# Patient Record
Sex: Male | Born: 1960 | Race: White | Hispanic: No | Marital: Married | Smoking: Former smoker
Health system: Southern US, Community
[De-identification: ages and names within clinical notes are randomized; demographics above are authoritative.]

## PROBLEM LIST (undated history)

## (undated) DIAGNOSIS — I10 Essential (primary) hypertension: Secondary | ICD-10-CM

## (undated) DIAGNOSIS — N182 Chronic kidney disease, stage 2 (mild): Secondary | ICD-10-CM

## (undated) DIAGNOSIS — I214 Non-ST elevation (NSTEMI) myocardial infarction: Secondary | ICD-10-CM

## (undated) DIAGNOSIS — R739 Hyperglycemia, unspecified: Secondary | ICD-10-CM

## (undated) DIAGNOSIS — N189 Chronic kidney disease, unspecified: Secondary | ICD-10-CM

## (undated) DIAGNOSIS — M1990 Unspecified osteoarthritis, unspecified site: Secondary | ICD-10-CM

## (undated) DIAGNOSIS — I2510 Atherosclerotic heart disease of native coronary artery without angina pectoris: Secondary | ICD-10-CM

## (undated) DIAGNOSIS — E7800 Pure hypercholesterolemia, unspecified: Secondary | ICD-10-CM

## (undated) DIAGNOSIS — E119 Type 2 diabetes mellitus without complications: Secondary | ICD-10-CM

## (undated) HISTORY — DX: Non-ST elevation (NSTEMI) myocardial infarction: I21.4

## (undated) HISTORY — PX: NECK SURGERY: SHX720

## (undated) HISTORY — DX: Chronic kidney disease, stage 2 (mild): N18.2

## (undated) HISTORY — PX: BACK SURGERY: SHX140

## (undated) HISTORY — DX: Essential (primary) hypertension: I10

## (undated) HISTORY — DX: Chronic kidney disease, unspecified: N18.9

## (undated) HISTORY — PX: HERNIA REPAIR: SHX51

## (undated) HISTORY — DX: Type 2 diabetes mellitus without complications: E11.9

---

## 2002-10-08 ENCOUNTER — Encounter: Payer: Self-pay | Admitting: Neurosurgery

## 2002-10-08 ENCOUNTER — Ambulatory Visit (HOSPITAL_COMMUNITY): Admission: RE | Admit: 2002-10-08 | Discharge: 2002-10-09 | Payer: Self-pay | Admitting: Neurosurgery

## 2002-11-03 ENCOUNTER — Encounter: Payer: Self-pay | Admitting: Neurosurgery

## 2002-11-03 ENCOUNTER — Encounter: Admission: RE | Admit: 2002-11-03 | Discharge: 2002-11-03 | Payer: Self-pay | Admitting: Neurosurgery

## 2005-05-14 ENCOUNTER — Ambulatory Visit (HOSPITAL_COMMUNITY): Admission: RE | Admit: 2005-05-14 | Discharge: 2005-05-14 | Payer: Self-pay | Admitting: Family Medicine

## 2005-11-02 ENCOUNTER — Ambulatory Visit (HOSPITAL_COMMUNITY): Admission: RE | Admit: 2005-11-02 | Discharge: 2005-11-02 | Payer: Self-pay | Admitting: Family Medicine

## 2009-12-26 ENCOUNTER — Ambulatory Visit (HOSPITAL_COMMUNITY): Admission: RE | Admit: 2009-12-26 | Discharge: 2009-12-26 | Payer: Self-pay | Admitting: Family Medicine

## 2010-06-01 NOTE — H&P (Addendum)
  NAMEASHWIN, Graves NO.:  [id number]  MEDICAL RECORD NO.:  [id number]           PATIENT TYPE:  LOCATION:                                 FACILITY:  PHYSICIAN:  Dalia Heading, M.D.  DATE OF BIRTH:  01/04/1961  DATE OF ADMISSION: DATE OF DISCHARGE:  LH                             HISTORY & PHYSICAL   CHIEF COMPLAINT:  Right inguinal hernia.  HISTORY OF PRESENT ILLNESS:  The patient is a 50 year old white male who is referred for evaluation and treatment of right inguinal hernia.  It has been present since last year, but recently is increasing in size and causing him discomfort.  It is made worse with straining.  PAST MEDICAL HISTORY:  Hypertension, gout, high cholesterol levels.  PAST SURGICAL HISTORY:  Neck surgery in 2005.  CURRENT MEDICATIONS:  A blood pressure pill, a gout pill, a cholesterol pill.  ALLERGIES:  No known drug allergies.  REVIEW OF SYSTEMS:  The patient denies drinking or smoking.  Denies anyother cardiopulmonary difficulties or bleeding disorders.  PHYSICAL EXAMINATION:  GENERAL:  The patient is a well-developed, well- nourished white male in no acute distress. HEENT:  Reveals no scleral icterus. LUNGS:  Clear to auscultation with equal breath sounds bilaterally. HEART:  Reveals regular rate and rhythm without S3, S4, or murmurs. ABDOMEN:  Soft, nontender, nondistended.  No hepatosplenomegaly or masses are noted.  Reducible right inguinal hernia is present. GENITOURINARY:  Within normal limits.  IMPRESSION:  Right inguinal hernia.  PLAN:  The patient is scheduled for right inguinal herniorrhaphy on June 13, 2010.  The risks and benefits of the procedure including bleeding, infection, pain, the possibility of recurrence of the hernia were fully explained to the patient, gave informed consent.     Dalia Heading, M.D.     MAJ/MEDQ  D:  05/30/2010  T:  05/31/2010  Job:  [id number]  cc:   Kirk Ruths, M.D. Fax:  [phone]  Electronically Signed by Franky Macho M.D. on 06/13/2010 08:59:12 AM

## 2010-06-07 ENCOUNTER — Other Ambulatory Visit: Payer: Self-pay | Admitting: General Surgery

## 2010-06-07 ENCOUNTER — Encounter (HOSPITAL_COMMUNITY): Payer: Managed Care, Other (non HMO) | Attending: General Surgery

## 2010-06-07 DIAGNOSIS — Z01812 Encounter for preprocedural laboratory examination: Secondary | ICD-10-CM | POA: Insufficient documentation

## 2010-06-07 DIAGNOSIS — Z01810 Encounter for preprocedural cardiovascular examination: Secondary | ICD-10-CM | POA: Insufficient documentation

## 2010-06-07 LAB — BASIC METABOLIC PANEL
CO2: 30 mEq/L (ref 19–32)
Calcium: 9.4 mg/dL (ref 8.4–10.5)
Chloride: 100 mEq/L (ref 96–112)
GFR calc Af Amer: >60 mL/min (ref >60)
Sodium: 141 mEq/L (ref 135–145)

## 2010-06-07 LAB — SURGICAL PCR SCREEN: Staphylococcus aureus: POSITIVE — AB

## 2010-06-07 LAB — CBC
MCV: 86.5 fL (ref 78.0–100.0)
Platelets: 243 10*3/uL (ref 150–400)
RBC: 4.67 MIL/uL (ref 4.22–5.81)
RDW: 14 % (ref 11.5–15.5)
WBC: 7.6 10*3/uL (ref 4.0–10.5)

## 2010-06-13 ENCOUNTER — Ambulatory Visit (HOSPITAL_COMMUNITY)
Admission: RE | Admit: 2010-06-13 | Discharge: 2010-06-13 | Disposition: A | Discharge status: Home / Self Care | Payer: Managed Care, Other (non HMO) | Source: Ambulatory Visit | Attending: General Surgery | Admitting: General Surgery

## 2010-06-13 DIAGNOSIS — K4090 Unilateral inguinal hernia, without obstruction or gangrene, not specified as recurrent: Secondary | ICD-10-CM | POA: Insufficient documentation

## 2010-06-13 DIAGNOSIS — Z79899 Other long term (current) drug therapy: Secondary | ICD-10-CM | POA: Insufficient documentation

## 2010-06-13 DIAGNOSIS — I10 Essential (primary) hypertension: Secondary | ICD-10-CM | POA: Insufficient documentation

## 2010-06-15 NOTE — Op Note (Signed)
  [name], [name] NO.:  [id number]  MEDICAL RECORD NO.:  [id number]           PATIENT TYPE:  O  LOCATION:  [facility]                          FACILITY:  [facility]  PHYSICIAN:  Dalia Heading, M.D.  DATE OF BIRTH:  1960/04/18  DATE OF PROCEDURE:  06/13/2010 DATE OF DISCHARGE:                              OPERATIVE REPORT   PREOPERATIVE DIAGNOSIS:  Right inguinal hernia.  POSTOPERATIVE DIAGNOSIS:  Right inguinal hernia, indirect.  PROCEDURE:  Right inguinal herniorrhaphy.  SURGEON:  Dalia Heading, MD  ANESTHESIA:  General endotracheal.  INDICATIONS:  The patient is a 50 year old white male who presents with symptomatic right inguinal hernia.  The risks and benefits of the procedure including bleeding, infection, pain, and the possibility of recurrence of the hernia were fully explained to the patient, gave informed consent.  PROCEDURE NOTE:  The patient was placed in a supine position.  After general anesthesia was administered, the right groin region was prepped and draped using the usual sterile technique with Betadine.  Surgical site confirmation was performed.  A transverse incision was made in the right groin region down to the external oblique aponeurosis.  The aponeurosis was incised to the external ring.  The ilioinguinal nerve was identified and was transected and ligated due to its frail nature.  Sensorcaine 0.5% was instilled into the proximal portion of the ilioinguinal nerve.  An indirect Penrose drain was placed around the spermatic cord.  The vase deferens was noted within the spermatic cord.  A high ligation of the lipoma was performed.  The lipoma was disposed.  An indirect hernia sac was freed away from the spermatic cord up to the peritoneal reflection and inverted.  A medium-size Bard PerFix plug was then inserted.  An onlay patch was then placed along the floor of the inguinal canal and secured superiorly to the conjoined tendon and  inferiorly to the shelving edge Poupart's ligament using 2-0 Novafil interrupted sutures.  The internal ring was recreated using a 2-0 Novafil interrupted suture.  An On-Q pain pump catheter was then inserted next to the spermatic cord.  It was brought out through a separate percutaneous insertion site from the incision.  The external oblique aponeurosis was reapproximated using a 2- 0 Vicryl interrupted suture.  The subcutaneous layer was reapproximated using a 3-0 Vicryl interrupted suture.  The skin was closed using a 4-0 Vicryl subcuticular suture.  Sensorcaine 0.5% was instilled in the surrounding wound.  Dermabond was then applied.  All tape and needle counts were correct at the end of procedure.  The patient was awakened and transferred to PACU in stable condition.  COMPLICATIONS:  None.  SPECIMEN:  None.  ESTIMATED BLOOD LOSS:  Minimal.     Dalia Heading, M.D.     MAJ/MEDQ  D:  06/13/2010  T:  06/13/2010  Job:  [id number]  cc:   Seth Graves, M.D. Fax: [phone]  Electronically Signed by Franky Macho M.D. on 06/15/2010 10:02:00 AM

## 2010-09-01 NOTE — Op Note (Signed)
NAME:  Seth Graves, Seth Graves                            ACCOUNT NO.:  [id number]   MEDICAL RECORD NO.:  [id number]                   PATIENT TYPE:  OIB   LOCATION:  2899                                 FACILITY:  [facility]   PHYSICIAN:  Reinaldo Meeker, M.D.              DATE OF BIRTH:  [birth year]   DATE OF PROCEDURE:  10/08/2002  DATE OF DISCHARGE:                                 OPERATIVE REPORT   PREOPERATIVE DIAGNOSIS:  Herniated disc C5-C6, left.   POSTOPERATIVE DIAGNOSIS:  Herniated disc C5-C6, left.   PROCEDURE:  C5-C6 anterior cervical discectomy with bone bank fusion  followed by cervical plating with the operating microscope.   SURGEON:  Reinaldo Meeker, M.D.   ASSISTANT:  Donalee Citrin, M.D.   PROCEDURE IN DETAIL:  After being placed in the prone position and 10 pounds  of halter traction, the patient's neck was prepped and draped in the usual  sterile fashion.  Localizing x-ray was taken prior to the incision to  identify the appropriate level.  A transverse incision was made in the right  anterior neck starting at the midline and heading towards the medial aspect  of the sternocleidomastoid muscle.  The platysmal muscle was incised  transversely.  The natural fascial plane between the strap muscles medially,  sternocleidomastoid muscle laterally, was identified down to the anterior  aspect of the cervical spine.  The longus colli muscles were identified,  split in the midline, swept away bilaterally, and held with a Art therapist.  Self-retaining retractor was placed for exposure and an x-ray showed we  approached the C5-C6 level.  Using the 15 blade, the annulus of the disc was  incised.  Using pituitaries, rongeurs, and curets, the disc material was  removed.  A high speed drill was used to widen the interspace.  At this  time, the microscope was draped and brought into the field and used until  the end of the case.  Using microdissection technique, the remaining disc  protrusion on the posterior longitudinal ligament was removed.  The ligament  was then incised transversely and the cut edge was removed with a Kerrison  punch.  Inspection of the C5-C6 on the left reveals a large amount of  herniated disc material.  This was removed to decompress the underlying C6  nerve root.  Similar decompression was carried out towards the right side.  At this point, inspection was carried out in all directions for evidence of  residual compression and none could be identified.  Large amounts of  irrigation was carried out and any bleeding controlled with bipolar  coagulation and Gelfoam.  Measurements were taken and a 7 by 11 by 14 mm  bone plug was reconstituted.  After irrigating once more and confirming  hemostasis, the plug was impacted and fluoroscopy showed it to be in good  position.  An appropriate plate was then chosen.  Under fluoroscopic  guidance, drill holes were placed, followed by placement of 15 mm screws x  4.  The locking mechanism was then secured.  Final fluoroscopy showed the  plate, plug, and screws to all be in good position.  Large amounts of  irrigation was carried out at this time with any bleeding controlled by  bipolar coagulation.  The wound was then closed using interrupted Vicryl in  the platysma muscle, inverted 5-0 PDS on the subcuticular layer, and Steri-  Strips on the skin.  Sterile dressings were then applied and the patient was  extubated and taken to the recovery room in stable condition.                                              Reinaldo Meeker, M.D.   ROK/MEDQ  D:  10/08/2002  T:  10/09/2002  Job:  [id number]

## 2012-06-03 ENCOUNTER — Encounter (HOSPITAL_COMMUNITY): Payer: Self-pay

## 2012-06-03 ENCOUNTER — Emergency Department (HOSPITAL_COMMUNITY): Payer: Managed Care, Other (non HMO)

## 2012-06-03 ENCOUNTER — Inpatient Hospital Stay (HOSPITAL_COMMUNITY)
Admission: EM | Admit: 2012-06-03 | Discharge: 2012-06-05 | DRG: 247 | Disposition: A | Discharge status: Home / Self Care | Payer: Managed Care, Other (non HMO) | Attending: Cardiovascular Disease | Admitting: Cardiovascular Disease

## 2012-06-03 DIAGNOSIS — R778 Other specified abnormalities of plasma proteins: Secondary | ICD-10-CM

## 2012-06-03 DIAGNOSIS — R079 Chest pain, unspecified: Secondary | ICD-10-CM

## 2012-06-03 DIAGNOSIS — Z23 Encounter for immunization: Secondary | ICD-10-CM

## 2012-06-03 DIAGNOSIS — I214 Non-ST elevation (NSTEMI) myocardial infarction: Principal | ICD-10-CM | POA: Diagnosis present

## 2012-06-03 DIAGNOSIS — E7800 Pure hypercholesterolemia, unspecified: Secondary | ICD-10-CM | POA: Diagnosis present

## 2012-06-03 DIAGNOSIS — I2510 Atherosclerotic heart disease of native coronary artery without angina pectoris: Secondary | ICD-10-CM | POA: Diagnosis present

## 2012-06-03 DIAGNOSIS — Z79899 Other long term (current) drug therapy: Secondary | ICD-10-CM

## 2012-06-03 DIAGNOSIS — I10 Essential (primary) hypertension: Secondary | ICD-10-CM | POA: Diagnosis present

## 2012-06-03 HISTORY — DX: Pure hypercholesterolemia, unspecified: E78.00

## 2012-06-03 HISTORY — DX: Atherosclerotic heart disease of native coronary artery without angina pectoris: I25.10

## 2012-06-03 HISTORY — DX: Unspecified osteoarthritis, unspecified site: M19.90

## 2012-06-03 HISTORY — DX: Hyperglycemia, unspecified: R73.9

## 2012-06-03 LAB — CBC WITH DIFFERENTIAL/PLATELET
Basophils Relative: 0 % (ref 0–1)
Eosinophils Absolute: 0.2 10*3/uL (ref 0.0–0.7)
Hemoglobin: 14.2 g/dL (ref 13.0–17.0)
MCHC: 34.3 g/dL (ref 30.0–36.0)
Monocytes Relative: 6 % (ref 3–12)
Neutro Abs: 6.9 10*3/uL (ref 1.7–7.7)
Neutrophils Relative %: 66 % (ref 43–77)
Platelets: 238 10*3/uL (ref 150–400)
RBC: 4.83 MIL/uL (ref 4.22–5.81)

## 2012-06-03 LAB — BASIC METABOLIC PANEL
Calcium: 9.3 mg/dL (ref 8.4–10.5)
Creatinine, Ser: 1.19 mg/dL (ref 0.50–1.35)
GFR calc non Af Amer: 70 mL/min — ABNORMAL LOW (ref >90)
Sodium: 139 mEq/L (ref 135–145)

## 2012-06-03 LAB — TROPONIN I: Troponin I: 0.63 ng/mL (ref <0.30)

## 2012-06-03 LAB — D-DIMER, QUANTITATIVE: D-Dimer, Quant: 0.68 ug/mL-FEU — ABNORMAL HIGH (ref 0.00–0.48)

## 2012-06-03 LAB — PROTIME-INR
INR: 1.01 (ref 0.00–1.49)
Prothrombin Time: 13.2 seconds (ref 11.6–15.2)

## 2012-06-03 LAB — TSH: TSH: 3.717 u[IU]/mL (ref 0.350–4.500)

## 2012-06-03 LAB — MRSA PCR SCREENING: MRSA by PCR: NEGATIVE

## 2012-06-03 MED ORDER — METOPROLOL TARTRATE 25 MG PO TABS
25.0000 mg | ORAL_TABLET | Freq: Two times a day (BID) | ORAL | Status: DC
  Administered 2012-06-03 – 2012-06-04 (×3): 25 mg via ORAL
  Filled 2012-06-03 (×6): qty 1

## 2012-06-03 MED ORDER — ASPIRIN EC 81 MG PO TBEC
81.0000 mg | DELAYED_RELEASE_TABLET | Freq: Every day | ORAL | Status: DC
  Administered 2012-06-05: 81 mg via ORAL
  Filled 2012-06-03: qty 1

## 2012-06-03 MED ORDER — HEPARIN (PORCINE) IN NACL 100-0.45 UNIT/ML-% IJ SOLN
1300.0000 [IU]/h | INTRAMUSCULAR | Status: DC
  Administered 2012-06-03: 1300 [IU]/h via INTRAVENOUS
  Filled 2012-06-03 (×2): qty 250

## 2012-06-03 MED ORDER — ASPIRIN 325 MG PO TABS
325.0000 mg | ORAL_TABLET | Freq: Once | ORAL | Status: AC
  Administered 2012-06-03: 325 mg via ORAL
  Filled 2012-06-03: qty 1

## 2012-06-03 MED ORDER — NYSTATIN-TRIAMCINOLONE 100000-0.1 UNIT/GM-% EX CREA: 1.0000 "application " | TOPICAL_CREAM | Freq: Two times a day (BID) | CUTANEOUS | Status: DC | PRN

## 2012-06-03 MED ORDER — LISINOPRIL 20 MG PO TABS
20.0000 mg | ORAL_TABLET | Freq: Every day | ORAL | Status: DC
  Administered 2012-06-04 – 2012-06-05 (×2): 20 mg via ORAL
  Filled 2012-06-03 (×3): qty 1

## 2012-06-03 MED ORDER — SODIUM CHLORIDE 0.9 % IV SOLN
INTRAVENOUS | Status: DC
  Administered 2012-06-03: 19:00:00 via INTRAVENOUS

## 2012-06-03 MED ORDER — HEPARIN BOLUS VIA INFUSION
4000.0000 [IU] | Freq: Once | INTRAVENOUS | Status: AC
  Administered 2012-06-03: 4000 [IU] via INTRAVENOUS
  Filled 2012-06-03: qty 4000

## 2012-06-03 MED ORDER — NITROGLYCERIN 0.4 MG SL SUBL
0.4000 mg | SUBLINGUAL_TABLET | SUBLINGUAL | Status: DC | PRN
  Administered 2012-06-04 (×2): 0.4 mg via SUBLINGUAL
  Filled 2012-06-03: qty 25

## 2012-06-03 MED ORDER — SODIUM CHLORIDE 0.9 % IV SOLN
1.0000 mL/kg/h | INTRAVENOUS | Status: DC
  Administered 2012-06-04: 1 mL/kg/h via INTRAVENOUS

## 2012-06-03 MED ORDER — ATORVASTATIN CALCIUM 80 MG PO TABS
80.0000 mg | ORAL_TABLET | Freq: Every day | ORAL | Status: DC
  Filled 2012-06-03 (×2): qty 1

## 2012-06-03 MED ORDER — ACETAMINOPHEN 325 MG PO TABS: 650.0000 mg | ORAL_TABLET | ORAL | Status: DC | PRN

## 2012-06-03 MED ORDER — ONDANSETRON HCL 4 MG/2ML IJ SOLN: 4.0000 mg | Freq: Four times a day (QID) | INTRAMUSCULAR | Status: DC | PRN

## 2012-06-03 MED ORDER — ASPIRIN 81 MG PO CHEW
324.0000 mg | CHEWABLE_TABLET | ORAL | Status: AC
  Administered 2012-06-04: 324 mg via ORAL
  Filled 2012-06-03: qty 4

## 2012-06-03 NOTE — ED Notes (Signed)
2900 floor unable to take report at this time

## 2012-06-03 NOTE — ED Notes (Signed)
Pt reports mid sternal cp that radiated to jaw,that has been off/on since Sunday, now describes as "funny feeling" in his body. +sob at times, +nausea at times, deneis any fever or cough

## 2012-06-03 NOTE — H&P (Signed)
Patient ID: JAYLEND REILAND MRN: [id number] DOB/AGE: [birth year] 52 y.o. Admit date: 06/03/2012  Primary Care Physician:MCGOUGH,WILLIAM M, MD Primary Cardiologist: none (will be followed in [facility] office) Active Problems:   * No active hospital problems. *  HPI: A pleasant 52 year old gentleman presenting with substernal chest tightness. The patient first noticed chest discomfort about 48 hours ago and experienced pressure in the substernal region up through the arms lasting approximately one hour. Symptoms resolved and recurred again yesterday. He again had mild chest discomfort today he went to the emergency room. His cardiac enzymes were elevated. There were no acute EKG changes. He is currently chest pain-free. The patient was transferred here for further cardiac evaluation and cardiac catheterization.   the patient has no past history of similar symptoms. He works as a Naval architect. He denies fevers, chills, leg swelling, calf pain, lightheadedness, or syncope. He's had no diaphoresis, nausea, or vomiting. He does admit to associated shortness of breath.  He has a strong family history of coronary artery disease. His father had a myocardial infarction around age 8.  Past Medical History  Diagnosis Date  . Hypertension   . Hypercholesteremia     Past Surgical History  Procedure Laterality Date  . Cardiovascular stress test    . Hernia repair    . Back surgery    . Neck surgery      Family history: Positive for premature CAD in his father.  History   Social History  . Marital Status: Married    Spouse Name: N/A    Number of Children: N/A  . Years of Education: N/A   Occupational History  . Not on file.   Social History Main Topics  . Smoking status: Never Smoker   . Smokeless tobacco: Not on file  . Alcohol Use: No  . Drug Use: No  . Sexually Active: Yes    Birth Control/ Protection: None   Other Topics Concern  . Not on file   Social History Narrative    . No narrative on file     Prescriptions prior to admission  Medication Sig Dispense Refill  . lisinopril (PRINIVIL,ZESTRIL) 20 MG tablet Take 20 mg by mouth daily.      . naproxen sodium (ALEVE) 220 MG tablet Take 220 mg by mouth 2 (two) times daily as needed (headache/pain.).      Marland Kitchen nystatin-triamcinolone (MYCOLOG II) cream Apply 1 application topically 2 (two) times daily.      . simvastatin (ZOCOR) 20 MG tablet Take 20 mg by mouth every evening.       Review of systems: General: no fevers/chills/night sweats/weight loss Eyes: no blurry vision, diplopia, or amaurosis ENT: no sore throat or hearing loss Resp: no cough, wheezing, or hemoptysis CV: no edema or palpitations GI: no abdominal pain, nausea, vomiting, diarrhea, or constipation GU: no dysuria, frequency, or hematuria Skin: no rash Neuro: no headache, numbness, tingling, or weakness of extremities Musculoskeletal: no joint pain or swelling Heme: no bleeding, DVT, or easy bruising Endo: no polydipsia or polyuria  Physical Exam: Blood pressure 164/95, pulse 67, temperature 98.1 F (36.7 C), temperature source Oral, resp. rate 15, height 6' (1.829 m), weight 107.049 kg (236 lb), SpO2 99.00%.  Pt is alert and oriented, WD, WN, in no distress. HEENT: normal Neck: JVP normal. Carotid upstrokes normal without bruits. No thyromegaly. Lungs: equal expansion, clear bilaterally CV: Apex is discrete and nondisplaced, RRR without murmur or gallop Abd: soft, NT, +BS, no bruit, no  hepatosplenomegaly Back: no CVA tenderness Ext: no C/C/E        Femoral pulses 2+= without bruits        DP/PT pulses intact and = Skin: warm and dry without rash Neuro: CNII-XII intact             Strength intact = bilaterally  Labs:   Lab Results  Component Value Date   WBC 10.5 06/03/2012   HGB 14.2 06/03/2012   HCT 41.4 06/03/2012   MCV 85.7 06/03/2012   PLT 238 06/03/2012    Recent Labs Lab 06/03/12 1308  NA 139  K 3.7  CL 102  CO2 26   BUN 19  CREATININE 1.19  CALCIUM 9.3  GLUCOSE 129*   Lab Results  Component Value Date   TROPONINI 0.63* 06/03/2012    No results found for this basename: CHOL   No results found for this basename: HDL   No results found for this basename: LDLCALC   No results found for this basename: TRIG   No results found for this basename: CHOLHDL   No results found for this basename: LDLDIRECT      Radiology: Dg Chest Portable 1 View  06/03/2012  *RADIOLOGY REPORT*  Clinical Data: Chest pain.  PORTABLE CHEST - 1 VIEW  Comparison: 11/02/2005.  Findings: The cardiac silhouette, mediastinal and hilar contours are within normal limits and stable. The lungs are clear.  No pleural effusions.  The bony thorax is intact.  IMPRESSION: Normal chest x-ray.   Original Report Authenticated By: Rudie Meyer, M.D.    EKG: Sinus rhythm 89 beats per minute, nonspecific T wave abnormality, otherwise within normal limits.  ASSESSMENT AND PLAN:  67. 52 year old gentleman with acute coronary syndrome, troponin-positive. The patient's symptoms and presentation are highly typical for ACS. I think cardiac catheterization and possible PCI are clearly indicated. I have reviewed the specific risks, indications, and alternatives with the patient and his family. They understand and agree to proceed. He will be treated with unfractionated heparin, aspirin, high-dose statin, and metoprolol. If he has recurrent chest pain I would recommend loading him with brilinta 180 mg. Otherwise will plan on a loading dose on the table after his coronary anatomy is delineated.  2. Hypercholesterolemia. Will change to high potency statin and check a lipid panel in the morning.  3. Hypertension. Add beta blocker as outlined above. Adjust medicines based on blood pressure response.  Signed: Tonny Bollman 06/03/2012, 5:40 PM

## 2012-06-03 NOTE — ED Notes (Signed)
CRITICAL VALUE ALERT  Critical value received:  troponin 0.63  Date of notification: 06/03/2012  Time of notification:  1411  Critical value read back:yes  Nurse who received alert:  c Niranjan Rufener rn  MD notified (1st page):  Dr Adriana Simas  Time of first page:  1411  MD notified (2nd page):  Time of second page:  Responding MD:  Dr Adriana Simas  Time MD responded:  [phone]

## 2012-06-03 NOTE — Progress Notes (Signed)
ANTICOAGULATION CONSULT NOTE - Initial Consult  Pharmacy Consult for Heparin  Indication: chest pain/ACS  No Known Allergies  Patient Measurements: Height: 6' (182.9 cm) Weight: 236 lb (107.049 kg) IBW/kg (Calculated) : 77.6 Heparin Dosing Weight: 99 kg  Vital Signs: Temp: 98.1 F (36.7 C) (02/18 1700) Temp src: Oral (02/18 1700) BP: 170/100 mmHg (02/18 1645) Pulse Rate: 67 (02/18 1645)  Labs:  Recent Labs  06/03/12 1308  HGB 14.2  HCT 41.4  PLT 238  CREATININE 1.19  TROPONINI 0.63*    Estimated Creatinine Clearance: 95 ml/min (by C-G formula based on Cr of 1.19).   Medical History: Past Medical History  Diagnosis Date  . Hypertension   . Hypercholesteremia     Medications:  No anticoagulation PTA   Assessment: Seth Graves is a 78 YOM admitted with CP and elevated troponin to begin heparin per pharmacy. CBC is WNL. Heparin dosing weight 99 kg. No heparin given thus far.   Goal of Therapy:  Heparin level 0.3-0.7 units/ml Monitor platelets by anticoagulation protocol: Yes   Plan:  Heparin bolus 4000 units then begin heparin drip at 1300 units/hr F/u with 6 hour HL  Daily HL and CBC  Thank you,  Brett Fairy, PharmD, BCPS 06/03/2012 5:27 PM

## 2012-06-03 NOTE — ED Provider Notes (Signed)
History  This chart was scribed for Seth Hutching, MD by Ardeen Jourdain, ED Scribe. This patient was seen in room APA18/APA18 and the patient's care was started at 1307.  CSN: [id number]  Arrival date & time 06/03/12  1300   First MD Initiated Contact with Patient 06/03/12 1307      Chief Complaint  Patient presents with  . Chest Pain     The history is provided by the patient. No language interpreter was used.    Seth Graves is a 52 y.o. male who presents to the Emergency Department complaining of intermittent, sharp, mid sternal CP that began 2 days ago. He states the pain would begin in his chest and radiate into his back. He states his jaw hurt and his left arm felt numb during the intermittent periods of pain. He describes the feeling now as a "funny feeling." He denies any SOB, nausea, emesis and diaphoresis. He reports 3 episodes of pain since onset. He states he has a family h/o early onset MI and CVA in his father. He states he also has family h/o CHF. 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 Pt reports having a stress test 4-5 years ago.     Past Medical History  Diagnosis Date  . Hypertension   . Hypercholesteremia     Past Surgical History  Procedure Laterality Date  . Cardiovascular stress test    . Hernia repair    . Back surgery    . Neck surgery      No family history on file.  History  Substance Use Topics  . Smoking status: Never Smoker   . Smokeless tobacco: Not on file  . Alcohol Use: No     Review of Systems  All other systems reviewed and are negative.   A complete 10 system review of systems was obtained and all systems are negative except as noted in the HPI and PMH.   Allergies  Review of patient's allergies indicates no known allergies.  Home Medications   Current Outpatient Rx  Name  Route  Sig  Dispense  Refill  . lisinopril (PRINIVIL,ZESTRIL) 20 MG tablet   Oral   Take 20 mg by mouth daily.         . naproxen sodium (ALEVE) 220 MG tablet   Oral   Take 220 mg by mouth 2 (two) times daily as needed (headache/pain.).         Marland Kitchen nystatin-triamcinolone (MYCOLOG II) cream   Topical   Apply 1 application topically 2 (two) times daily.         . simvastatin (ZOCOR) 20 MG tablet   Oral   Take 20 mg by mouth every evening.           Triage Vitals: BP 141/106  Pulse 111  Temp(Src) 97.6 F (36.4 C) (Oral)  Resp 20  Ht 6' (1.829 m)  Wt 236 lb (107.049 kg)  BMI 32 kg/m2  SpO2 100%  Physical Exam  Nursing note and vitals reviewed. Constitutional: He is oriented to person, place, and time. He appears well-developed and well-nourished.  HENT:  Head: Normocephalic and atraumatic.  Eyes: Conjunctivae and EOM are normal. Pupils are equal, round, and reactive to light.  Neck: Normal range of motion. Neck supple.  Cardiovascular: Normal rate, regular rhythm and normal heart sounds.   Pulmonary/Chest: Effort normal and breath sounds normal.  Abdominal: Soft. Bowel sounds are normal.  Musculoskeletal: Normal range  of motion.  Neurological: He is alert and oriented to person, place, and time.  Skin: Skin is warm and dry.  Psychiatric: He has a normal mood and affect.    ED Course  Procedures (including critical care time)  DIAGNOSTIC STUDIES: Oxygen Saturation is 100% on room ar, normal by my interpretation.    COORDINATION OF CARE:  1:43 PMDiscussed treatment plan which includes EKG, CXR, CBC, BMP and troponin with pt at bedside and pt agreed to plan.     Labs Reviewed  TROPONIN I - Abnormal; Notable for the following:    Troponin I 0.63 (*)    All other components within normal limits  BASIC METABOLIC PANEL - Abnormal; Notable for the following:    Glucose, Bld 129 (*)    GFR calc non Af Amer 70 (*)    GFR calc Af Amer 81 (*)    All other components within normal limits  D-DIMER, QUANTITATIVE - Abnormal; Notable for the following:    D-Dimer, Quant 0.68 (*)     All other components within normal limits  CBC WITH DIFFERENTIAL   Dg Chest Portable 1 View  06/03/2012  *RADIOLOGY REPORT*  Clinical Data: Chest pain.  PORTABLE CHEST - 1 VIEW  Comparison: 11/02/2005.  Findings: The cardiac silhouette, mediastinal and hilar contours are within normal limits and stable. The lungs are clear.  No pleural effusions.  The bony thorax is intact.  IMPRESSION: Normal chest x-ray.   Original Report Authenticated By: Rudie Meyer, M.D.     Date: 06/03/2012  Rate: 89  Rhythm: normal sinus rhythm  QRS Axis: normal  Intervals: normal  ST/T Wave abnormalities: normal  Conduction Disutrbances: none  Narrative Interpretation: unremarkable CRITICAL CARE Performed by: Seth Graves   Total critical care time: 30  Critical care time was exclusive of separately billable procedures and treating other patients.  Critical care was necessary to treat or prevent imminent or life-threatening deterioration.  Critical care was time spent personally by me on the following activities: development of treatment plan with patient and/or surrogate as well as nursing, discussions with consultants, evaluation of patient's response to treatment, examination of patient, obtaining history from patient or surrogate, ordering and performing treatments and interventions, ordering and review of laboratory studies, ordering and review of radiographic studies, pulse oximetry and re-evaluation of patient's condition.   No diagnosis found.    MDM  Good history for acute coronary syndrome.  Risk factor profile includes hypertension, hypercholesterolemia, positive family history. EKG normal. Troponin 0.63.   Discussed with Dr. Tonny Bollman. Transfer to [facility]     I personally performed the services described in this documentation, which was scribed in my presence. The recorded information has been reviewed and is accurate.    Seth Hutching, MD 06/03/12 [phone]

## 2012-06-04 ENCOUNTER — Encounter (HOSPITAL_COMMUNITY)
Admission: EM | Disposition: A | Discharge status: Home / Self Care | Payer: Self-pay | Source: Home / Self Care | Attending: Cardiovascular Disease

## 2012-06-04 ENCOUNTER — Encounter (HOSPITAL_COMMUNITY): Payer: Self-pay | Admitting: General Practice

## 2012-06-04 DIAGNOSIS — Z01810 Encounter for preprocedural cardiovascular examination: Secondary | ICD-10-CM

## 2012-06-04 DIAGNOSIS — I2510 Atherosclerotic heart disease of native coronary artery without angina pectoris: Secondary | ICD-10-CM

## 2012-06-04 DIAGNOSIS — I214 Non-ST elevation (NSTEMI) myocardial infarction: Secondary | ICD-10-CM

## 2012-06-04 HISTORY — PX: PERCUTANEOUS CORONARY STENT INTERVENTION (PCI-S): SHX5485

## 2012-06-04 HISTORY — PX: LEFT HEART CATHETERIZATION WITH CORONARY ANGIOGRAM: SHX5451

## 2012-06-04 LAB — COMPREHENSIVE METABOLIC PANEL
ALT: 19 U/L (ref 0–53)
AST: 19 U/L (ref 0–37)
Albumin: 3.7 g/dL (ref 3.5–5.2)
CO2: 27 mEq/L (ref 19–32)
Calcium: 9 mg/dL (ref 8.4–10.5)
Creatinine, Ser: 1.04 mg/dL (ref 0.50–1.35)
GFR calc non Af Amer: 83 mL/min — ABNORMAL LOW (ref >90)
Sodium: 140 mEq/L (ref 135–145)

## 2012-06-04 LAB — CBC
MCH: 29.4 pg (ref 26.0–34.0)
MCHC: 34.5 g/dL (ref 30.0–36.0)
MCV: 85.3 fL (ref 78.0–100.0)
Platelets: 219 10*3/uL (ref 150–400)
RBC: 4.89 MIL/uL (ref 4.22–5.81)

## 2012-06-04 LAB — LIPID PANEL
LDL Cholesterol: 125 mg/dL — ABNORMAL HIGH (ref 0–99)
Total CHOL/HDL Ratio: 4.9 RATIO
VLDL: 33 mg/dL (ref 0–40)

## 2012-06-04 LAB — HEMOGLOBIN A1C: Mean Plasma Glucose: 120 mg/dL — ABNORMAL HIGH (ref <117)

## 2012-06-04 LAB — TROPONIN I: Troponin I: 0.69 ng/mL (ref <0.30)

## 2012-06-04 LAB — HEPARIN LEVEL (UNFRACTIONATED): Heparin Unfractionated: 0.18 IU/mL — ABNORMAL LOW (ref 0.30–0.70)

## 2012-06-04 LAB — POCT ACTIVATED CLOTTING TIME: Activated Clotting Time: 453 seconds

## 2012-06-04 SURGERY — LEFT HEART CATHETERIZATION WITH CORONARY ANGIOGRAM
Anesthesia: LOCAL

## 2012-06-04 MED ORDER — MIDAZOLAM HCL 2 MG/2ML IJ SOLN
INTRAMUSCULAR | Status: AC
  Filled 2012-06-04: qty 2

## 2012-06-04 MED ORDER — TICAGRELOR 90 MG PO TABS
ORAL_TABLET | ORAL | Status: AC
  Administered 2012-06-04: 90 mg via ORAL
  Filled 2012-06-04: qty 2

## 2012-06-04 MED ORDER — VERAPAMIL HCL 2.5 MG/ML IV SOLN
INTRAVENOUS | Status: AC
  Filled 2012-06-04: qty 2

## 2012-06-04 MED ORDER — BIVALIRUDIN 250 MG IV SOLR
INTRAVENOUS | Status: AC
  Filled 2012-06-04: qty 250

## 2012-06-04 MED ORDER — TICAGRELOR 90 MG PO TABS
90.0000 mg | ORAL_TABLET | Freq: Two times a day (BID) | ORAL | Status: DC
  Administered 2012-06-04 – 2012-06-05 (×2): 90 mg via ORAL
  Filled 2012-06-04 (×3): qty 1

## 2012-06-04 MED ORDER — FENTANYL CITRATE 0.05 MG/ML IJ SOLN
INTRAMUSCULAR | Status: AC
  Filled 2012-06-04: qty 2

## 2012-06-04 MED ORDER — INFLUENZA VIRUS VACC SPLIT PF IM SUSP
0.5000 mL | INTRAMUSCULAR | Status: DC
  Filled 2012-06-04: qty 0.5

## 2012-06-04 MED ORDER — SODIUM CHLORIDE 0.9 % IV SOLN
0.2500 mg/kg/h | INTRAVENOUS | Status: DC
  Administered 2012-06-04: 0.25 mg/kg/h via INTRAVENOUS
  Filled 2012-06-04: qty 250

## 2012-06-04 MED ORDER — NITROGLYCERIN 1 MG/10 ML FOR IR/CATH LAB
INTRA_ARTERIAL | Status: AC
  Filled 2012-06-04: qty 10

## 2012-06-04 MED ORDER — SODIUM CHLORIDE 0.9 % IV SOLN
1.0000 mL/kg/h | INTRAVENOUS | Status: AC
  Administered 2012-06-04: 1 mL/kg/h via INTRAVENOUS

## 2012-06-04 MED ORDER — HEPARIN SODIUM (PORCINE) 1000 UNIT/ML IJ SOLN
INTRAMUSCULAR | Status: AC
  Filled 2012-06-04: qty 1

## 2012-06-04 NOTE — Care Management Note (Addendum)
    Page 1 of 1   06/05/2012     11:20:14 AM   CARE MANAGEMENT NOTE 06/05/2012  Patient:  Seth Graves, Seth Graves   Account Number:  [id number]  Date Initiated:  06/04/2012  Documentation initiated by:  Junius Creamer  Subjective/Objective Assessment:   adm w mi     Action/Plan:   lives w wife, pcp dr Karleen Hampshire   Anticipated DC Date:  06/05/2012   Anticipated DC Plan:  HOME/SELF CARE      DC Planning Services  CM consult      Choice offered to / List presented to:             Status of service:   Medicare Important Message given?   (If response is "NO", the following Medicare IM given date fields will be blank) Date Medicare IM given:   Date Additional Medicare IM given:    Discharge Disposition:    Per UR Regulation:  Reviewed for med. necessity/level of care/duration of stay  If discussed at Long Length of Stay Meetings, dates discussed:    Comments:  2/20 @1100 .Marland KitchenMarland KitchenOletta Cohn, RN, BSN, [place] [phone] Benefits check... Brilinta $70/30 day supply.  Relayed infor to patient prior to d/c home.  Pt has 30day free card and prescription refill card in hand.   2/19 0817 debbie dowell rn,bsn

## 2012-06-04 NOTE — Progress Notes (Signed)
Patient complains of 8/10 chest pain. Pt given sublingual Nitro, placed nasal cannula with 2L o2, called for STAT ekg.

## 2012-06-04 NOTE — Progress Notes (Signed)
Spoke with Dr [place] about patients chest pain, ekg results, and last cardiac enzyme results. Patient to go to cath lab now.   Dawson Bills, RN

## 2012-06-04 NOTE — Progress Notes (Signed)
VASCULAR LAB PRELIMINARY  PRELIMINARY  PRELIMINARY  PRELIMINARY  Bilateral lower extremity venous duplex  completed.    Preliminary report:  Bilateral:  No evidence of DVT, superficial thrombosis, or Baker's Cyst.    Chelise Hanger, RVT 06/04/2012, 4:56 PM

## 2012-06-04 NOTE — CV Procedure (Signed)
Cardiac Catheterization Procedure Note  Name: Seth Graves MRN: [id number] DOB: [birth year]  Procedure: Left Heart Cath, Selective Coronary Angiography, LV angiography, PTCA and stenting of the mid RCA, Ramus intermediate, and mid left circumflex arteries.  Indication: 52 year old white male with history of hypertension, hyperlipidemia, and family history of early coronary disease presents with a non-ST elevation myocardial infarction. He had recurrent chest pain at rest despite optimal medical therapy.  Procedural Details:  The right wrist was prepped, draped, and anesthetized with 1% lidocaine. Using the modified Seldinger technique, a 5 French sheath was introduced into the right radial artery. 3 mg of verapamil was administered through the sheath, weight-based unfractionated heparin was administered intravenously. Standard Judkins catheters were used for selective coronary angiography and left ventriculography. Catheter exchanges were performed over an exchange length guidewire.  PROCEDURAL FINDINGS Hemodynamics: AO 132/85 with a mean of 108 mmHg LV 132/13 mmHg    Coronary angiography: Coronary dominance: right  Left mainstem: Large and normal  Left anterior descending (LAD): The left anterior descending artery is a large vessel extends around the apex. There is a long 30% stenosis in the mid vessel. The first diagonal is without significant disease.  Ramus intermediate: This is a large branch which has a 90-95% stenosis in the proximal vessel.  Left circumflex (LCx): There is a long segmental stenosis in the mid vessel up to 70%.  Right coronary artery (RCA):  The right coronary is a very large dominant vessel. It has mild disease in the proximal vessel up to 20-30%. In the mid vessel there is a focal stenosis of 90-95% at an acute angulated bend. There is ectasia following this lesion and then a segment of 30% disease distal.  Left ventriculography: Left ventricular systolic  function is normal, LVEF is estimated at 55-60%, there is mild mid anterior hypokinesis, there is no significant mitral regurgitation   PCI Note:  Following the diagnostic procedure, the decision was made to proceed with PCI. The radial sheath was upsized to a 6 [place]. Brilinta 180 mg was given orally. Weight-based bivalirudin was given for anticoagulation. Once a therapeutic ACT was achieved, a 6 [place] XB RCA guide catheter was inserted.  A pro-water coronary guidewire was used to cross the lesion in the mid RCA.  The lesion was predilated with a 2.5 mm balloon.  The lesion was then stented with a 4.0 x 28 mm Promus premier stent.  The stent was postdilated with a 4.5 mm noncompliant balloon.  Following PCI, there was 0% residual stenosis and TIMI-3 flow. Final angiography confirmed an excellent result.  We then proceeded with intervention of the ramus intermediate branch. We switched to a XB LAD 3.5 guide. A pro-water coronary guidewire was used to cross the lesion. The lesion was predilated with a 2.5 mm balloon. The lesion was then stented with a 3.0 x 20 mm Promus premier stent the stent was postdilated with a 3.0 mm noncompliant balloon. Following PCI, there was 0% residual stenosis and TIMI grade 3 flow. Final angiography confirmed an excellent result.  We next proceeded with intervention of the mid left circumflex. This lesion was crossed with the pro-water. It was predilated using a 2.5 mm balloon. It was then stented with a 3.0 x 20 mm Promus premier stent. The stent was postdilated with a 3.0 mm noncompliant balloon. Following PCI, there was 0% residual stenosis and TIMI grade 3 flow. Final angiography confirmed an excellent result. The patient tolerated the procedure well. There were no immediate procedural  complications. A TR band was used for radial hemostasis. The patient was transferred to the post catheterization recovery area for further monitoring.  PCI Data: Vessel #1 - RCA/Segment -  mid Percent Stenosis (pre)  90-95% TIMI-flow 3 Stent 4.0 x 28 mm Promus premier Percent Stenosis (post) 0% TIMI-flow (post) 3  Vessel #2-ramus intermediate/proximal Percent stenoses: 90-95% TIMI flow 3 Stent: 3.0 x 20 mm Promus premier Percent stenoses (post): 0% TIMI flow: 3  Vessel #3-left circumflex/mid Percent stenoses: 70% TIMI flow 3 Stent: 3.0 x 20 mm Promus premier Percent stenoses (post) 0% TIMI flow: 3  Final Conclusions:   1. Three-vessel obstructive coronary disease. 2. Good left ventricular function. 3. Successful stenting of the mid RCA, proximal ramus intermediate, and mid left circumflex with drug-eluting stents.   Recommendations:  Risk factor modification. Recommend dual antiplatelet therapy for one year.  Theron Arista [facility] 06/04/2012, 10:49 AM

## 2012-06-04 NOTE — Interval H&P Note (Signed)
History and Physical Interval Note:  06/04/2012 9:04 AM  Seth Graves  has presented today for surgery, with the diagnosis of Chest pain  The various methods of treatment have been discussed with the patient and family. After consideration of risks, benefits and other options for treatment, the patient has consented to  Procedure(s): LEFT HEART CATHETERIZATION WITH CORONARY ANGIOGRAM (N/A) as a surgical intervention .  The patient's history has been reviewed, patient examined, no change in status, stable for surgery.  I have reviewed the patient's chart and labs.  Questions were answered to the patient's satisfaction.     Theron Arista [facility] 06/04/2012 9:04 AM

## 2012-06-04 NOTE — Progress Notes (Signed)
ANTICOAGULATION CONSULT NOTE - Follow Up Consult  Pharmacy Consult for Heparin  Indication: chest pain/ACS  No Known Allergies  Patient Measurements: Height: 6' (182.9 cm) Weight: 236 lb (107.049 kg) IBW/kg (Calculated) : 77.6 Heparin Dosing Weight: 99 kg  Vital Signs: Temp: 97.6 F (36.4 C) (02/19 0005) Temp src: Oral (02/19 0005) BP: 119/91 mmHg (02/19 0000) Pulse Rate: 67 (02/18 1645)  Labs:  Recent Labs  06/03/12 1308 06/03/12 1831 06/04/12 0003  HGB 14.2  --   --   HCT 41.4  --   --   PLT 238  --   --   LABPROT  --  13.2  --   INR  --  1.01  --   HEPARINUNFRC  --   --  0.40  CREATININE 1.19  --   --   TROPONINI 0.63* 0.44* 0.69*    Estimated Creatinine Clearance: 95 ml/min (by C-G formula based on Cr of 1.19).   Medical History: Past Medical History  Diagnosis Date  . Hypertension   . Hypercholesteremia     Medications:  No anticoagulation PTA   Assessment: Mr. Sorbo is a 46 YOM admitted with CP on IV heparin. Plan for cardiac cath.   Heparin level (0.4) is at-goal on 1300 units/hr.   Goal of Therapy:  Heparin level 0.3-0.7 units/ml Monitor platelets by anticoagulation protocol: Yes   Plan:  1. Continue IV heparin at 1300 units/hr.  2. Heparin level in 6 hours to confirm dosing.   Lorre Munroe, PharmD.  06/04/2012 1:37 AM

## 2012-06-04 NOTE — Progress Notes (Signed)
TR BAND REMOVAL  LOCATION:  right radial  DEFLATED PER PROTOCOL:  yes  TIME BAND OFF / DRESSING APPLIED: 1425     SITE UPON ARRIVAL:   Level 0  SITE AFTER BAND REMOVAL:  Level 0  REVERSE ALLEN'S TEST:    positive  CIRCULATION SENSATION AND MOVEMENT:  Within Normal Limits  yes  COMMENTS:

## 2012-06-05 ENCOUNTER — Telehealth: Payer: Self-pay | Admitting: Physician Assistant

## 2012-06-05 ENCOUNTER — Encounter (HOSPITAL_COMMUNITY): Payer: Self-pay | Admitting: Physician Assistant

## 2012-06-05 DIAGNOSIS — I214 Non-ST elevation (NSTEMI) myocardial infarction: Secondary | ICD-10-CM

## 2012-06-05 DIAGNOSIS — R079 Chest pain, unspecified: Secondary | ICD-10-CM

## 2012-06-05 LAB — BASIC METABOLIC PANEL
BUN: 11 mg/dL (ref 6–23)
Chloride: 104 mEq/L (ref 96–112)
GFR calc Af Amer: 83 mL/min — ABNORMAL LOW (ref >90)
Potassium: 4.2 mEq/L (ref 3.5–5.1)
Sodium: 140 mEq/L (ref 135–145)

## 2012-06-05 LAB — CBC
HCT: 40.9 % (ref 39.0–52.0)
RDW: 13.6 % (ref 11.5–15.5)
WBC: 8.3 10*3/uL (ref 4.0–10.5)

## 2012-06-05 MED ORDER — METOPROLOL TARTRATE 50 MG PO TABS
50.0000 mg | ORAL_TABLET | Freq: Two times a day (BID) | ORAL | Status: DC
  Administered 2012-06-05: 50 mg via ORAL
  Filled 2012-06-05 (×2): qty 1

## 2012-06-05 MED ORDER — ATORVASTATIN CALCIUM 80 MG PO TABS: 80.0000 mg | ORAL_TABLET | Freq: Every day | ORAL | Status: DC

## 2012-06-05 MED ORDER — NITROGLYCERIN 0.4 MG SL SUBL: 0.4000 mg | SUBLINGUAL_TABLET | SUBLINGUAL | Status: DC | PRN

## 2012-06-05 MED ORDER — METOPROLOL TARTRATE 50 MG PO TABS: 50.0000 mg | ORAL_TABLET | Freq: Two times a day (BID) | ORAL | Status: DC

## 2012-06-05 MED ORDER — ASPIRIN 81 MG PO TBEC: 81.0000 mg | DELAYED_RELEASE_TABLET | Freq: Every day | ORAL | Status: AC

## 2012-06-05 MED ORDER — TICAGRELOR 90 MG PO TABS: 90.0000 mg | ORAL_TABLET | Freq: Two times a day (BID) | ORAL | Status: DC

## 2012-06-05 MED FILL — Dextrose Inj 5%: INTRAVENOUS | Qty: 50 | Status: AC

## 2012-06-05 NOTE — Discharge Summary (Signed)
Discharge Summary   Patient ID: Seth Graves MRN: [id number], DOB/AGE: 11/30/1960 52 y.o. Admit date: 06/03/2012 D/C date:     06/05/2012  Primary Cardiologist: Will be Morton (seen by [facility] here after transfer from [facility])  Primary Discharge Diagnoses:  1. NSTEMI/newly diagnosed CAD this admission - PTCA/DES to the mid RCA, Ramus intermediate, and mid left circumflex arteries 06/04/12 2. HTN 3. Hyperlipidemia - statin changed to higher dose, will need f/u labs 4. Hyperglycemia A1C 5.8  Secondary Discharge Diagnoses:  1. 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 He presented initially to [facility] 06/03/12 with complaints of chest pain. He first noticed it 48 hours prior to admission with pressure in the substernal region up through the arms lasting approximately one hour. Symptoms resolved but recurred so he came to the ER where cardiac enzymes were elevated. There were no acute EKG changes. He was transferred to [facility] for further evaluation. He was pain free at time of arrival. He denied fevers, chills, leg swelling, calf pain, lightheadedness, or syncope, diaphoresis, nausea, or vomiting. He was started on heparin, ASA, metoprolol, and statin was changed to a higher potency. He underwent cardiac cath the following day demonstrating 3-vessel obstructive CAD and subsequently had PTCA and stenting of the mid RCA, Ramus intermediate, and mid left circumflex arteries. LV function was good. Dual antiplatelet therapy was recommended for 1 yr. He did have a mildly elevated d-dimer on admit of 0.68 but there was low suspicion for PE given that his CAD explained his symptoms. He was not tachycardic/tachypnic/hypoxic. LE dopplers were performed which were negative. Today the patient is feeling well. Dr. Swaziland has seen and examined Seth Graves and feels he is stable for discharge. Due to his NSTEMI, he will be made a TOC patient  given our protocol. His metoprolol was adjusted for BP. Dr. Sw[place]iland has recommended not to return to work for 2 weeks. I do not think it is a good idea for Seth Graves to travel [place] to [place] for this weekend (12 hour drive there and back) and told Seth Graves so. The patient was given a RTW note and also a 30 day Brilinta rx for use with free card.    Discharge Vitals: Blood pressure 141/108, pulse 61, temperature 98 F (36.7 C), temperature source Oral, resp. rate 15, height 6' (1.829 m), weight 234 lb 12.6 oz (106.5 kg), SpO2 97.00%.  Labs: Lab Results  Component Value Date   WBC 8.3 06/05/2012   HGB 14.0 06/05/2012   HCT 40.9 06/05/2012   MCV 85.2 06/05/2012   PLT 215 06/05/2012    Recent Labs Lab 06/04/12 0437 06/05/12 0640  NA 140 140  K 4.2 4.2  CL 104 104  CO2 27 28  BUN 15 11  CREATININE 1.04 1.17  CALCIUM 9.0 9.1  PROT 6.9  --   BILITOT 0.4  --   ALKPHOS 51  --   ALT 19  --   AST 19  --   GLUCOSE 109* 106*    Recent Labs  06/03/12 1308 06/03/12 1831 06/04/12 0003 06/04/12 0437  TROPONINI 0.63* 0.44* 0.69* 0.53*   Lab Results  Component Value Date   CHOL 199 06/04/2012   HDL 41 06/04/2012   LDLCALC 125* 06/04/2012   TRIG 163* 06/04/2012   Lab Results  Component Value Date   DDIMER 0.68* 06/03/2012    Diagnostic Studies/Procedures  1. Cardiac catheterization this admission, please see full report and above for summary. 2. Dg Chest Portable 1 View 06/03/2012  *RADIOLOGY REPORT*  Clinical Data: Chest pain.  PORTABLE CHEST - 1 VIEW  Comparison: 11/02/2005.  Findings: The cardiac silhouette, mediastinal and hilar contours are within normal limits and stable. The lungs are clear.  No pleural effusions.  The bony thorax is intact.  IMPRESSION: Normal chest x-ray.   Original Report Authenticated By: Rudie Meyer, M.D.     Discharge Medications     Medication List    STOP taking these medications       ALEVE 220 MG tablet  Generic drug:  naproxen sodium      simvastatin 20 MG tablet  Commonly known as:  ZOCOR  Replaced by:  atorvastatin 80 MG tablet      TAKE these medications       aspirin 81 MG EC tablet  Take 1 tablet (81 mg total) by mouth daily.     atorvastatin 80 MG tablet  Commonly known as:  LIPITOR  Take 1 tablet (80 mg total) by mouth daily.     lisinopril 20 MG tablet  Commonly known as:  PRINIVIL,ZESTRIL  Take 20 mg by mouth daily.     metoprolol 50 MG tablet  Commonly known as:  LOPRESSOR  Take 1 tablet (50 mg total) by mouth 2 (two) times daily.     nitroGLYCERIN 0.4 MG SL tablet  Commonly known as:  NITROSTAT  Place 1 tablet (0.4 mg total) under the tongue every 5 (five) minutes as needed for chest pain (up to 3 doses).     nystatin-triamcinolone cream  Commonly known as:  MYCOLOG II  Apply 1 application topically 2 (two) times daily.     Ticagrelor 90 MG Tabs tablet  Commonly known as:  BRILINTA  Take 1 tablet (90 mg total) by mouth 2 (two) times daily.        Disposition   The patient will be discharged in stable condition to home.     Discharge Orders   Future Appointments Provider Department Dept Phone   06/11/2012 10:30 AM Dyann Kief, [facility] [place] [phone]   Future Orders Complete By Expires     Diet - low sodium heart healthy  As directed     Discharge instructions  As directed     Comments:      You were taking Aleve prior to admission - that medicine can increase risk of stomach bleeding in patients taking medicines like aspirin and Brilinta. You may try Tylenol over the counter (as directed) first instead to see if that works for your pain.    Increase activity slowly  As directed     Comments:      No driving for 1 week. No lifting over 10 lbs for 2 weeks. No sexual activity for 2 weeks. You may return to work on 06/19/12. Keep procedure site clean & dry. If you notice increased pain, swelling, bleeding or pus, call/return!  You may shower, but no soaking baths/hot  tubs/pools for 1 week.      Follow-up Information   Follow up with Kirk Ruths, MD. (Your blood sugar was borderline elevated. Please make sure this is followed by your primary care doctor.)    Contact information:   [address] [place] [address] [phone]       Follow up with Jacolyn Reedy, PA. (06/11/12 at 10:30am)  Contact information:   Therapist, music Office [address] [place] [address] [phone]          Duration of Discharge Encounter: Greater than 30 minutes including physician and PA time.  Signed, Kriste Basque Dunn PA-C 06/05/2012, 10:15 AM

## 2012-06-05 NOTE — Progress Notes (Signed)
TELEMETRY: Reviewed telemetry pt in NSR: Filed Vitals:   06/04/12 2346 06/05/12 0632 06/05/12 0752 06/05/12 0755  BP: 141/91 140/83  141/108  Pulse: 62 61    Temp: 97.4 F (36.3 C) 98 F (36.7 C) 98 F (36.7 C)   TempSrc: Oral Oral Oral   Resp:      Height:      Weight:  234 lb 12.6 oz (106.5 kg)    SpO2: 97% 97% 97%     Intake/Output Summary (Last 24 hours) at 06/05/12 0829 Last data filed at 06/04/12 1930  Gross per 24 hour  Intake 1389.5 ml  Output   1450 ml  Net  -60.5 ml    SUBJECTIVE Mild chest discomfort and dyspnea immediately after procedure- now resolved. Feels well.  LABS: Basic Metabolic Panel:  Recent Labs  96/04/54 0437 06/05/12 0640  NA 140 140  K 4.2 4.2  CL 104 104  CO2 27 28  GLUCOSE 109* 106*  BUN 15 11  CREATININE 1.04 1.17  CALCIUM 9.0 9.1   Liver Function Tests:  Recent Labs  06/04/12 0437  AST 19  ALT 19  ALKPHOS 51  BILITOT 0.4  PROT 6.9  ALBUMIN 3.7   CBC:  Recent Labs  06/03/12 1308 06/04/12 0437 06/05/12 0640  WBC 10.5 7.2 8.3  NEUTROABS 6.9  --   --   HGB 14.2 14.4 14.0  HCT 41.4 41.7 40.9  MCV 85.7 85.3 85.2  PLT 238 219 215   Cardiac Enzymes:  Recent Labs  06/03/12 1831 06/04/12 0003 06/04/12 0437  TROPONINI 0.44* 0.69* 0.53*   D-Dimer:  Recent Labs  06/03/12 1320  DDIMER 0.68*   Hemoglobin A1C:  Recent Labs  06/03/12 1831  HGBA1C 5.8*   Fasting Lipid Panel:  Recent Labs  06/04/12 0437  CHOL 199  HDL 41  LDLCALC 125*  TRIG 163*  CHOLHDL 4.9   Thyroid Function Tests:  Recent Labs  06/03/12 1831  TSH 3.717   Radiology/Studies:  Dg Chest Portable 1 View  06/03/2012  *RADIOLOGY REPORT*  Clinical Data: Chest pain.  PORTABLE CHEST - 1 VIEW  Comparison: 11/02/2005.  Findings: The cardiac silhouette, mediastinal and hilar contours are within normal limits and stable. The lungs are clear.  No pleural effusions.  The bony thorax is intact.  IMPRESSION: Normal chest x-ray.   Original  Report Authenticated By: Rudie Meyer, M.D.    ECG: 05-Jun-2012 06:37:35 [facility] ROUTINE RECORD Normal sinus rhythm Incomplete right bundle branch block Borderline ECG  PHYSICAL EXAM General: Well developed, well nourished, in no acute distress. Head: Normocephalic, atraumatic, sclera non-icteric, no xanthomas, nares are without discharge. Neck: Negative for carotid bruits. JVD not elevated. Lungs: Clear bilaterally to auscultation without wheezes, rales, or rhonchi. Breathing is unlabored. Heart: RRR S1 S2 without murmurs, rubs, or gallops.  Abdomen: Soft, non-tender, non-distended with normoactive bowel sounds. No hepatomegaly. No rebound/guarding. No obvious abdominal masses. Msk:  Strength and tone appears normal for age. Extremities: No clubbing, cyanosis or edema.  Distal pedal pulses are 2+ and equal bilaterally. Right radial site without hematoma. Neuro: Alert and oriented X 3. Moves all extremities spontaneously. Psych:  Responds to questions appropriately with a normal affect.  ASSESSMENT AND PLAN: 1. NSTEMI. S/p 3 vessel PCI with DES of RCA, intermediate, and LCX. Continue dual antiplatelet therapy for one year. 2. HTN. Will increase metoprolol to 50 mg bid. Continue lisinopril at 20 mg daily. 3. Hyperlipidemia. High dose lipitor.  Plan: discharge home  today. Follow up in 2 weeks. 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

## 2012-06-05 NOTE — Telephone Encounter (Signed)
Noted pt is to be discharged today, will contact pt tomorrow 06-06-12

## 2012-06-05 NOTE — Progress Notes (Signed)
BENEFITS CHECK-- PER JUAN/ [employer] BRILINTA 90MG  2X DAY IS COVERED $70.00 FOR 30DAY SUPPLY.

## 2012-06-05 NOTE — Progress Notes (Signed)
Pt walking independently without problems. Ed completed. 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 Thinking about changing jobs.  1010-1100 Ethelda Chick CES, ACSM

## 2012-06-05 NOTE — Telephone Encounter (Signed)
TCM appt for NSTEMI discharged on 06/05/12 / tgs

## 2012-06-05 NOTE — Discharge Summary (Signed)
Patient seen and examined and history reviewed. Agree with above findings and plan. See my earlier rounding note.  [name] 06/05/2012 12:26 PM

## 2012-06-09 NOTE — Telephone Encounter (Signed)
.  left message to have patient return my call.  

## 2012-06-11 ENCOUNTER — Ambulatory Visit (INDEPENDENT_AMBULATORY_CARE_PROVIDER_SITE_OTHER): Payer: Managed Care, Other (non HMO) | Admitting: Physician Assistant

## 2012-06-11 ENCOUNTER — Encounter: Payer: Self-pay | Admitting: Physician Assistant

## 2012-06-11 VITALS — BP 123/83 | HR 90 | Ht 72.0 in | Wt 229.1 lb

## 2012-06-11 DIAGNOSIS — I214 Non-ST elevation (NSTEMI) myocardial infarction: Secondary | ICD-10-CM

## 2012-06-11 DIAGNOSIS — I2510 Atherosclerotic heart disease of native coronary artery without angina pectoris: Secondary | ICD-10-CM

## 2012-06-11 DIAGNOSIS — M109 Gout, unspecified: Secondary | ICD-10-CM

## 2012-06-11 DIAGNOSIS — R7309 Other abnormal glucose: Secondary | ICD-10-CM

## 2012-06-11 DIAGNOSIS — E782 Mixed hyperlipidemia: Secondary | ICD-10-CM

## 2012-06-11 DIAGNOSIS — E785 Hyperlipidemia, unspecified: Secondary | ICD-10-CM

## 2012-06-11 DIAGNOSIS — I10 Essential (primary) hypertension: Secondary | ICD-10-CM

## 2012-06-11 DIAGNOSIS — R739 Hyperglycemia, unspecified: Secondary | ICD-10-CM | POA: Insufficient documentation

## 2012-06-11 HISTORY — DX: Gout, unspecified: M10.9

## 2012-06-11 NOTE — Assessment & Plan Note (Signed)
Patient's hemoglobin A1c was 5.8 his blood sugars were 106-129 in the hospital. He is trying to cut back on his sugar intake. Followup with his primary M.D. Concerning this.

## 2012-06-11 NOTE — Assessment & Plan Note (Signed)
Patient has been unable to exercise because of gout. He is seeing his primary care for this today.

## 2012-06-11 NOTE — Progress Notes (Signed)
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 LV function was normal. Dual antiplatelet therapy was recommended for one year.  Overall he feels low but better since his hospitalization. He says he doesn't get as winded as he had been in the past. He is having trouble with gout and is not able to walk as much as he like. He is seeing his primary care physician today. The first couple days he was home he had some lightheadedness when he stood up and thought his blood pressure may have dropped but this has since resolved. He denies chest pain, palpitations, dyspnea, dyspnea on exertion, or presyncope. He complains of increased gas since he is trying to eat more greens and vegetables. His sugars were mildly elevated in the hospital and is trying to cut back on his juice and sugar intake.  He states he's been on Lipitor in the past and thought he had some aches and pains on it. So far he is having no symptoms.  No Known Allergies  Current Outpatient Prescriptions on File Prior to Visit: aspirin EC 81 MG EC tablet, Take 1 tablet (81 mg total) by mouth daily., Disp: , Rfl:  atorvastatin (LIPITOR) 80 MG tablet, Take 1 tablet (80 mg total) by mouth daily., Disp: 30 tablet, Rfl: 6 lisinopril (PRINIVIL,ZESTRIL) 20 MG tablet, Take 20 mg by mouth daily., Disp: , Rfl:  metoprolol (LOPRESSOR) 50 MG tablet, Take 1 tablet (50 mg total) by mouth 2 (two) times daily., Disp: 60 tablet, Rfl: 6 nitroGLYCERIN (NITROSTAT) 0.4 MG SL tablet, Place 1 tablet (0.4 mg total) under the tongue every 5 (five) minutes as needed for chest pain (up to 3 doses)., Disp: 25 tablet, Rfl: 4 nystatin-triamcinolone (MYCOLOG II) cream, Apply 1 application topically 2 (two) times daily., Disp: , Rfl:  Ticagrelor (BRILINTA) 90 MG TABS tablet, Take 1 tablet (90 mg total) by mouth 2 (two)  times daily., Disp: 60 tablet, Rfl: 10  No current facility-administered medications on file prior to visit.   Past Medical History:   Hypertension                                                 Hypercholesteremia                                           Coronary artery disease                                        Comment:a. NSTEMI 05/2012 s/p PTCA/DES of the mid RCA,               Ramus intermediate, and mid left circumflex               arteries   Arthritis  Hyperglycemia                                               Past Surgical History:   HERNIA REPAIR                                                 BACK SURGERY                                                  NECK SURGERY                                                 Review of patient's family history indicates:   CAD                            Father                     Comment: MI in his 67s   Social History   Marital Status: Married             Spouse Name:                      Years of Education:                 Number of children:             Occupational History   None on file  Social History Main Topics   Smoking Status: Former Smoker                   Packs/Day: 0.00  Years:           Quit date: 04/16/2001   Smokeless Status: Never Used                       Alcohol Use: No             Drug Use: No             Sexual Activity: Yes                    Birth Control/Protection: None  Other Topics            Concern   None on file  Social History Narrative   None on file    ROS:see history of present illness otherwise negative   PHYSICAL EXAM: Well-nournished, in no acute distress. Neck: No JVD, HJR, Bruit, or thyroid enlargement  Lungs: No tachypnea, clear without wheezing, rales, or rhonchi  Cardiovascular: RRR, PMI not displaced,positive S4, no murmur, bruit, thrill, or heave.  Abdomen: BS normal. Soft without organomegaly, masses, lesions  or tenderness.  Extremities: right radial artery at catheter site without hematoma or hemorrhage the brachial pulse, lower extremities without cyanosis, clubbing or edema. Good distal pulses bilateral  SKin: Warm, no  lesions or rashes   Musculoskeletal: No deformities  Neuro: no focal signs  BP 123/83  Pulse 90  Ht 6' (1.829 m)  Wt 229 lb 1.9 oz (103.928 kg)  BMI 31.07 kg/m2   EKG:NSR, normal EKG  Cardiac Cath 06/04/12: PCI Data: Vessel #1 - RCA/Segment - mid Percent Stenosis (pre)  90-95% TIMI-flow 3 Stent 4.0 x 28 mm Promus premier Percent Stenosis (post) 0% TIMI-flow (post) 3  Vessel #2-ramus intermediate/proximal Percent stenoses: 90-95% TIMI flow 3 Stent: 3.0 x 20 mm Promus premier Percent stenoses (post): 0% TIMI flow: 3  Vessel #3-left circumflex/mid Percent stenoses: 70% TIMI flow 3 Stent: 3.0 x 20 mm Promus premier Percent stenoses (post) 0% TIMI flow: 3  Final Conclusions:   1. Three-vessel obstructive coronary disease. 2. Good left ventricular function. 3. Successful stenting of the mid RCA, proximal ramus intermediate, and mid left circumflex with drug-eluting stents.   Recommendations:  Risk factor modification. Recommend dual antiplatelet therapy for one year.  Theron Arista [facility] 06/04/2012, 10:49 AM

## 2012-06-11 NOTE — Patient Instructions (Addendum)
Your physician recommends that you schedule a follow-up appointment in: 2 MONTHS WITH MD SM  Your physician recommends that you return for lab work in: 4 WEEKS (FASTING LIPIDS/LFT'S) Your physician recommends that you have follow up lab work, we will mail you a reminder letter to alert you when to go [employer], located across the street from our office.

## 2012-06-11 NOTE — Assessment & Plan Note (Signed)
Patient suffered a non-ST elevation MI on 06/04/12 treated with drug-eluting stent to the mid RCA, ramus intermediate, and mid circumflex. Patient is doing well without chest pain.

## 2012-06-11 NOTE — Telephone Encounter (Signed)
Noted pt to be seen in office today by ML at 10:30am, two attempts have been made in 2 business days to contact pt

## 2012-06-11 NOTE — Assessment & Plan Note (Signed)
Patient is currently on Lipitor 80 mg daily. We will check a fasting lipid panel and LFTs in 4 weeks. He has been on this medication before and felt he had aches and pains on it, but currently he is not having any symptoms on it. Will continue for now.

## 2012-06-11 NOTE — Telephone Encounter (Signed)
Pt was treated in office by NP ML today at OV and medications/therapies reviewed with pt at that time

## 2012-06-11 NOTE — Assessment & Plan Note (Signed)
Patient's blood pressure is stable today. He did have some dizziness when he first came home from the hospital but this has resolved. Continue current medicines at this time.

## 2012-06-27 ENCOUNTER — Other Ambulatory Visit: Payer: Self-pay | Admitting: *Deleted

## 2012-06-27 ENCOUNTER — Encounter: Payer: Self-pay | Admitting: *Deleted

## 2012-06-27 DIAGNOSIS — I2510 Atherosclerotic heart disease of native coronary artery without angina pectoris: Secondary | ICD-10-CM

## 2012-06-27 DIAGNOSIS — E782 Mixed hyperlipidemia: Secondary | ICD-10-CM

## 2012-07-07 LAB — LIPID PANEL
Cholesterol: 99 mg/dL (ref 0–200)
HDL: 25 mg/dL — ABNORMAL LOW (ref >39)
Total CHOL/HDL Ratio: 4 Ratio
Triglycerides: 92 mg/dL (ref <150)

## 2012-07-07 LAB — HEPATIC FUNCTION PANEL
ALT: 26 U/L (ref 0–53)
AST: 19 U/L (ref 0–37)
Albumin: 4.2 g/dL (ref 3.5–5.2)
Alkaline Phosphatase: 48 U/L (ref 39–117)
Total Protein: 6.7 g/dL (ref 6.0–8.3)

## 2012-08-09 ENCOUNTER — Encounter (HOSPITAL_COMMUNITY): Payer: Self-pay | Admitting: *Deleted

## 2012-08-09 ENCOUNTER — Emergency Department (HOSPITAL_COMMUNITY): Payer: Managed Care, Other (non HMO)

## 2012-08-09 ENCOUNTER — Emergency Department (HOSPITAL_COMMUNITY)
Admission: EM | Admit: 2012-08-09 | Discharge: 2012-08-09 | Disposition: A | Discharge status: Home / Self Care | Payer: Managed Care, Other (non HMO) | Attending: Emergency Medicine | Admitting: Emergency Medicine

## 2012-08-09 DIAGNOSIS — Y929 Unspecified place or not applicable: Secondary | ICD-10-CM | POA: Insufficient documentation

## 2012-08-09 DIAGNOSIS — I10 Essential (primary) hypertension: Secondary | ICD-10-CM | POA: Insufficient documentation

## 2012-08-09 DIAGNOSIS — Z862 Personal history of diseases of the blood and blood-forming organs and certain disorders involving the immune mechanism: Secondary | ICD-10-CM | POA: Insufficient documentation

## 2012-08-09 DIAGNOSIS — S0191XA Laceration without foreign body of unspecified part of head, initial encounter: Secondary | ICD-10-CM

## 2012-08-09 DIAGNOSIS — Z8639 Personal history of other endocrine, nutritional and metabolic disease: Secondary | ICD-10-CM | POA: Insufficient documentation

## 2012-08-09 DIAGNOSIS — I252 Old myocardial infarction: Secondary | ICD-10-CM | POA: Insufficient documentation

## 2012-08-09 DIAGNOSIS — Z23 Encounter for immunization: Secondary | ICD-10-CM | POA: Insufficient documentation

## 2012-08-09 DIAGNOSIS — I2510 Atherosclerotic heart disease of native coronary artery without angina pectoris: Secondary | ICD-10-CM | POA: Insufficient documentation

## 2012-08-09 DIAGNOSIS — S0100XA Unspecified open wound of scalp, initial encounter: Secondary | ICD-10-CM | POA: Insufficient documentation

## 2012-08-09 DIAGNOSIS — W208XXA Other cause of strike by thrown, projected or falling object, initial encounter: Secondary | ICD-10-CM | POA: Insufficient documentation

## 2012-08-09 DIAGNOSIS — Y939 Activity, unspecified: Secondary | ICD-10-CM | POA: Insufficient documentation

## 2012-08-09 DIAGNOSIS — Z7901 Long term (current) use of anticoagulants: Secondary | ICD-10-CM

## 2012-08-09 DIAGNOSIS — Z79899 Other long term (current) drug therapy: Secondary | ICD-10-CM | POA: Insufficient documentation

## 2012-08-09 DIAGNOSIS — Z7982 Long term (current) use of aspirin: Secondary | ICD-10-CM | POA: Insufficient documentation

## 2012-08-09 DIAGNOSIS — Z8739 Personal history of other diseases of the musculoskeletal system and connective tissue: Secondary | ICD-10-CM | POA: Insufficient documentation

## 2012-08-09 DIAGNOSIS — Z87891 Personal history of nicotine dependence: Secondary | ICD-10-CM | POA: Insufficient documentation

## 2012-08-09 DIAGNOSIS — E7800 Pure hypercholesterolemia, unspecified: Secondary | ICD-10-CM | POA: Insufficient documentation

## 2012-08-09 MED ORDER — TETANUS-DIPHTH-ACELL PERTUSSIS 5-2.5-18.5 LF-MCG/0.5 IM SUSP: 0.5000 mL | Freq: Once | INTRAMUSCULAR | Status: DC

## 2012-08-09 MED ORDER — TETANUS-DIPHTH-ACELL PERTUSSIS 5-2.5-18.5 LF-MCG/0.5 IM SUSP
0.5000 mL | Freq: Once | INTRAMUSCULAR | Status: AC
  Administered 2012-08-09: 0.5 mL via INTRAMUSCULAR
  Filled 2012-08-09: qty 0.5

## 2012-08-09 NOTE — ED Notes (Signed)
Pt was out working in yard when a tree limb pt states "the size of my arm" hit him in head, denies any LOC, had abrasions to top of head, abrasion to left elbow area. Admits to headache, concerned because he is on blood thinners,

## 2012-08-09 NOTE — ED Provider Notes (Signed)
History    This chart was scribed for Hilario Quarry, MD scribed by Magnus Sinning. The patient was seen in room [facility]17/[facility]17 at 16:42   CSN: [id number]  Arrival date & time 08/09/12  1627      Chief Complaint  Patient presents with  . Head Injury    (Consider location/radiation/quality/duration/timing/severity/associated sxs/prior treatment) Patient is a 52 y.o. male presenting with head injury. The history is provided by the patient. No language interpreter was used.  Head Injury Associated symptoms: headache (Mild discomfort at the site of head injury)   Associated symptoms: no nausea and no vomiting    Seth Graves is a 52 y.o. male who presents to the Emergency Department complaining of a head injury that occurred 20 minutes ago when he was cleaning up outside, which resulted in a 1 cm laceration to left frontal area of head. He says a dead small tree, approximately the size of his arm, fell and hit him in the head. He denies any LOC, but reports mild discomfort to the site of injury. and states his tetanus is probably not UTD.  The patient reports concern of injury, due to taking blood thinners for hx of MI.  PCP: Dr. Regino Schultze Past Medical History  Diagnosis Date  . Hypertension   . Hypercholesteremia   . Coronary artery disease     a. NSTEMI 05/2012 s/p PTCA/DES of the mid RCA, Ramus intermediate, and mid left circumflex arteries  . Arthritis   . Hyperglycemia   . Myocardial infarct     Past Surgical History  Procedure Laterality Date  . Hernia repair    . Back surgery    . Neck surgery    . Cardiac catheterization    . Cardiac stents      Family History  Problem Relation Age of Onset  . CAD Father     MI in his 74s    History  Substance Use Topics  . Smoking status: Former Smoker    Quit date: 04/16/2001  . Smokeless tobacco: Never Used  . Alcohol Use: No      Review of Systems  Gastrointestinal: Negative for nausea and vomiting.  Neurological:  Positive for headaches (Mild discomfort at the site of head injury).  All other systems reviewed and are negative.    Allergies  Review of patient's allergies indicates no known allergies.  Home Medications   Current Outpatient Rx  Name  Route  Sig  Dispense  Refill  . aspirin EC 81 MG EC tablet   Oral   Take 1 tablet (81 mg total) by mouth daily.         Marland Kitchen atorvastatin (LIPITOR) 80 MG tablet   Oral   Take 1 tablet (80 mg total) by mouth daily.   30 tablet   6   . lisinopril (PRINIVIL,ZESTRIL) 20 MG tablet   Oral   Take 20 mg by mouth daily.         . metoprolol (LOPRESSOR) 50 MG tablet   Oral   Take 1 tablet (50 mg total) by mouth 2 (two) times daily.   60 tablet   6   . nitroGLYCERIN (NITROSTAT) 0.4 MG SL tablet   Sublingual   Place 1 tablet (0.4 mg total) under the tongue every 5 (five) minutes as needed for chest pain (up to 3 doses).   25 tablet   4   . nystatin-triamcinolone (MYCOLOG II) cream   Topical   Apply 1 application topically 2 (two)  times daily.         . Ticagrelor (BRILINTA) 90 MG TABS tablet   Oral   Take 1 tablet (90 mg total) by mouth 2 (two) times daily.   60 tablet   10     BP 114/79  Pulse 98  Temp(Src) 97.6 F (36.4 C) (Oral)  Resp 18  Ht 6' (1.829 m)  Wt 229 lb (103.874 kg)  BMI 31.05 kg/m2  SpO2 99%  Physical Exam  Nursing note and vitals reviewed. Constitutional: He is oriented to person, place, and time. He appears well-developed and well-nourished.  HENT:  Head: Normocephalic and atraumatic.  Right Ear: Tympanic membrane and external ear normal.  Left Ear: Tympanic membrane and external ear normal.  Nose: Nose normal. Right sinus exhibits no maxillary sinus tenderness and no frontal sinus tenderness. Left sinus exhibits no maxillary sinus tenderness and no frontal sinus tenderness.  1 cm stellate laceration left frontal  Eyes: Conjunctivae and EOM are normal. Pupils are equal, round, and reactive to light. Right  eye exhibits no nystagmus. Left eye exhibits no nystagmus.  Neck: Normal range of motion. Neck supple.  Cardiovascular: Normal rate, regular rhythm, normal heart sounds and intact distal pulses.   Pulmonary/Chest: Effort normal and breath sounds normal. No respiratory distress. He exhibits no tenderness.  Abdominal: Soft. Bowel sounds are normal. He exhibits no distension and no mass. There is no tenderness.  Musculoskeletal: Normal range of motion. He exhibits no edema and no tenderness.  Neurological: He is alert and oriented to person, place, and time. He has normal strength and normal reflexes. No sensory deficit. He displays a negative Romberg sign. GCS eye subscore is 4. GCS verbal subscore is 5. GCS motor subscore is 6.  Reflex Scores:      Tricep reflexes are 2+ on the right side and 2+ on the left side.      Bicep reflexes are 2+ on the right side and 2+ on the left side.      Brachioradialis reflexes are 2+ on the right side and 2+ on the left side.      Patellar reflexes are 2+ on the right side and 2+ on the left side.      Achilles reflexes are 2+ on the right side and 2+ on the left side. Patient with normal gait without ataxia, shuffling, spasm, or antalgia. Speech is normal without dysarthria, dysphasia, or aphasia. Muscle strength is 5/5 in bilateral shoulders, elbow flexor and extensors, wrist flexor and extensors, and intrinsic hand muscles. 5/5 bilateral lower extremity hip flexors, extensors, knee flexors and extensors, and ankle dorsi and plantar flexors.    Skin: Skin is warm and dry. No rash noted.  Psychiatric: He has a normal mood and affect. His behavior is normal. Judgment and thought content normal.    ED Course  Procedures (including critical care time) DIAGNOSTIC STUDIES: Oxygen Saturation is 99% on room air, normal by my interpretation.    COORDINATION OF CARE: 16:43: Physical exam provided and intent given to order head CT and TDaP. The patient is  agreeable.  Labs Reviewed - No data to display Ct Head Wo Contrast  08/09/2012  *RADIOLOGY REPORT*  Clinical Data:  Head injury.  On anticoagulants.  CT HEAD WITHOUT CONTRAST  Technique:  Contiguous axial images were obtained from the base of the skull through the vertex without contrast  Comparison:  None.  Findings:  The brain has a normal appearance without evidence for hemorrhage, acute infarction, hydrocephalus, or mass  lesion.  There is no extra axial fluid collection.  The skull and paranasal sinuses are normal.  IMPRESSION: Normal CT of the head without contrast.   Original Report Authenticated By: Janeece Riggers, M.D.      No diagnosis found.    MDM  52 y.o. Male on anticoagulants who had tree branch hit in head with laceration which does not require sutures.  TDap ordered.  Ct negative- return precautions given.   I personally performed the services described in this documentation, which was scribed in my presence. The recorded information has been reviewed and considered.        Hilario Quarry, MD 08/09/12 1745

## 2012-08-09 NOTE — ED Notes (Signed)
Site cleaned with saline. Site bandaged with bandaid. Pt educated on s/s of infection,hand hygiene. Pt tolerated well.

## 2012-08-15 ENCOUNTER — Ambulatory Visit: Payer: Managed Care, Other (non HMO) | Admitting: Cardiology

## 2012-08-29 ENCOUNTER — Ambulatory Visit (INDEPENDENT_AMBULATORY_CARE_PROVIDER_SITE_OTHER): Payer: Managed Care, Other (non HMO) | Admitting: Adult Health

## 2012-08-29 ENCOUNTER — Encounter: Payer: Self-pay | Admitting: Adult Health

## 2012-08-29 VITALS — BP 100/66 | HR 66 | Ht 72.0 in | Wt 235.8 lb

## 2012-08-29 DIAGNOSIS — E785 Hyperlipidemia, unspecified: Secondary | ICD-10-CM

## 2012-08-29 DIAGNOSIS — I214 Non-ST elevation (NSTEMI) myocardial infarction: Secondary | ICD-10-CM

## 2012-08-29 DIAGNOSIS — I959 Hypotension, unspecified: Secondary | ICD-10-CM

## 2012-08-29 DIAGNOSIS — I2510 Atherosclerotic heart disease of native coronary artery without angina pectoris: Secondary | ICD-10-CM

## 2012-08-29 DIAGNOSIS — I10 Essential (primary) hypertension: Secondary | ICD-10-CM

## 2012-08-29 MED ORDER — LISINOPRIL 10 MG PO TABS
10.0000 mg | ORAL_TABLET | Freq: Every day | ORAL | Status: DC
Start: 2012-08-29 — End: 2012-09-19

## 2012-08-29 MED ORDER — FAMOTIDINE 20 MG PO TABS: 20.0000 mg | ORAL_TABLET | Freq: Every day | ORAL | Status: DC

## 2012-08-29 NOTE — Patient Instructions (Addendum)
Your physician recommends that you schedule a follow-up appointment in:6 MONTHS WITH KL AND ONE WEEK TO CHECK BLOOD PRESSURE WITH THE NURSE  Your physician has recommended you make the following change in your medication:   1) DECREASE LISINOPRIL TO 10MG  ONCE DAILY ( YOU CAN TAKE HALF OF 20MG  UNTIL COMPLETED) 2) START PEPCID 20MG  ONCE DAILY  Your physician recommends that you return for lab work in: TODAY (SLIPS GIVEN-BMET,CBC)

## 2012-08-29 NOTE — Assessment & Plan Note (Signed)
He is hypotensive here in the office. Orthostatics were completed he did have a 14 point drop in his blood pressure from sitting to standing. I will therefore decrease his lisinopril from 20 mg daily to 10 mg daily. He will followup in the office for blood pressure check in one week. BMET will be completed to evaluate kidney status on ACE.

## 2012-08-29 NOTE — Assessment & Plan Note (Signed)
The patient is tolerating: To well without evidence of bleeding or melena. He is complaining of some GERD symptoms and therefore I will place him on Pepcid 20 mg daily. We will check a CBC for evaluation of hemoglobin and hematocrit in the setting of antiplatelet therapy. Will continue ongoing risk modification. He is advised to avoid salty foods and sallow cholesterol diet.

## 2012-08-29 NOTE — Progress Notes (Deleted)
Name: Seth Graves    DOB: March 04, 1961  Age: 52 y.o.  MR#: [id number]       PCP:  Kirk Ruths, MD      Insurance: Payor: [employer] / Plan: C[employer] MANAGED / Product Type: *No Product type* /   CC:    Chief Complaint  Patient presents with  . Coronary Artery Disease  . Hypertension    VS Filed Vitals:   08/29/12 1404  BP: 108/72  Pulse: 71  Height: 6' (1.829 m)  Weight: 235 lb 12 oz (106.935 kg)    Weights Current Weight  08/29/12 235 lb 12 oz (106.935 kg)  08/09/12 229 lb (103.874 kg)  06/11/12 229 lb 1.9 oz (103.928 kg)    Blood Pressure  BP Readings from Last 3 Encounters:  08/29/12 108/72  08/09/12 114/79  06/11/12 123/83     Admit date:  (Not on file) Last encounter with RMR:  Visit date not found   Allergy Review of patient's allergies indicates no known allergies.  Current Outpatient Prescriptions  Medication Sig Dispense Refill  . aspirin EC 81 MG EC tablet Take 1 tablet (81 mg total) by mouth daily.      Marland Kitchen atorvastatin (LIPITOR) 80 MG tablet Take 1 tablet (80 mg total) by mouth daily.  30 tablet  6  . lisinopril (PRINIVIL,ZESTRIL) 20 MG tablet Take 20 mg by mouth daily.      . metoprolol (LOPRESSOR) 50 MG tablet Take 1 tablet (50 mg total) by mouth 2 (two) times daily.  60 tablet  6  . nitroGLYCERIN (NITROSTAT) 0.4 MG SL tablet Place 1 tablet (0.4 mg total) under the tongue every 5 (five) minutes as needed for chest pain (up to 3 doses).  25 tablet  4  . nystatin-triamcinolone (MYCOLOG II) cream Apply 1 application topically 2 (two) times daily.      . Ticagrelor (BRILINTA) 90 MG TABS tablet Take 1 tablet (90 mg total) by mouth 2 (two) times daily.  60 tablet  10   No current facility-administered medications for this visit.    Discontinued Meds:   There are no discontinued medications.  Patient Active Problem List   Diagnosis Date Noted  . Hyperlipidemia 06/11/2012  . Essential hypertension, benign 06/11/2012  . Hyperglycemia 06/11/2012  . Gout  06/11/2012  . NSTEMI (non-ST elevated myocardial infarction) 06/03/2012    LABS    Component Value Date/Time   NA 140 06/05/2012 0640   NA 140 06/04/2012 0437   NA 139 06/03/2012 1308   K 4.2 06/05/2012 0640   K 4.2 06/04/2012 0437   K 3.7 06/03/2012 1308   CL 104 06/05/2012 0640   CL 104 06/04/2012 0437   CL 102 06/03/2012 1308   CO2 28 06/05/2012 0640   CO2 27 06/04/2012 0437   CO2 26 06/03/2012 1308   GLUCOSE 106* 06/05/2012 0640   GLUCOSE 109* 06/04/2012 0437   GLUCOSE 129* 06/03/2012 1308   BUN 11 06/05/2012 0640   BUN 15 06/04/2012 0437   BUN 19 06/03/2012 1308   CREATININE 1.17 06/05/2012 0640   CREATININE 1.04 06/04/2012 0437   CREATININE 1.19 06/03/2012 1308   CALCIUM 9.1 06/05/2012 0640   CALCIUM 9.0 06/04/2012 0437   CALCIUM 9.3 06/03/2012 1308   GFRNONAA 72* 06/05/2012 0640   GFRNONAA 83* 06/04/2012 0437   GFRNONAA 70* 06/03/2012 1308   GFRAA 83* 06/05/2012 0640   GFRAA >90 06/04/2012 0437   GFRAA 81* 06/03/2012 1308   CMP     Component  Value Date/Time   NA 140 06/05/2012 0640   K 4.2 06/05/2012 0640   CL 104 06/05/2012 0640   CO2 28 06/05/2012 0640   GLUCOSE 106* 06/05/2012 0640   BUN 11 06/05/2012 0640   CREATININE 1.17 06/05/2012 0640   CALCIUM 9.1 06/05/2012 0640   PROT 6.7 07/07/2012 0910   ALBUMIN 4.2 07/07/2012 0910   AST 19 07/07/2012 0910   ALT 26 07/07/2012 0910   ALKPHOS 48 07/07/2012 0910   BILITOT 0.6 07/07/2012 0910   GFRNONAA 72* 06/05/2012 0640   GFRAA 83* 06/05/2012 0640       Component Value Date/Time   WBC 8.3 06/05/2012 0640   WBC 7.2 06/04/2012 0437   WBC 10.5 06/03/2012 1308   HGB 14.0 06/05/2012 0640   HGB 14.4 06/04/2012 0437   HGB 14.2 06/03/2012 1308   HCT 40.9 06/05/2012 0640   HCT 41.7 06/04/2012 0437   HCT 41.4 06/03/2012 1308   MCV 85.2 06/05/2012 0640   MCV 85.3 06/04/2012 0437   MCV 85.7 06/03/2012 1308    Lipid Panel     Component Value Date/Time   CHOL 99 07/07/2012 0910   TRIG 92 07/07/2012 0910   HDL 25* 07/07/2012 0910   CHOLHDL 4.0 07/07/2012 0910    VLDL 18 07/07/2012 0910   LDLCALC 56 07/07/2012 0910    ABG No results found for this basename: phart, pco2, pco2art, po2, po2art, hco3, tco2, acidbasedef, o2sat     Lab Results  Component Value Date   TSH 3.717 06/03/2012   BNP (last 3 results) No results found for this basename: PROBNP,  in the last 8760 hours Cardiac Panel (last 3 results) No results found for this basename: CKTOTAL, CKMB, TROPONINI, RELINDX,  in the last 72 hours  Iron/TIBC/Ferritin No results found for this basename: iron, tibc, ferritin     EKG Orders placed in visit on 06/11/12  . EKG 12-LEAD     Prior Assessment and Plan Problem List as of 08/29/2012   NSTEMI (non-ST elevated myocardial infarction)   Last Assessment & Plan   06/11/2012 Office Visit Written 06/11/2012 11:07 AM by Dyann Kief, PA      Patient suffered a non-ST elevation MI on 06/04/12 treated with drug-eluting stent to the mid RCA, ramus intermediate, and mid circumflex. Patient is doing well without chest pain.    Hyperlipidemia   Last Assessment & Plan   06/11/2012 Office Visit Written 06/11/2012 11:08 AM by Dyann Kief, PA     Patient is currently on Lipitor 80 mg daily. We will check a fasting lipid panel and LFTs in 4 weeks. He has been on this medication before and felt he had aches and pains on it, but currently he is not having any symptoms on it. Will continue for now.    Essential hypertension, benign   Last Assessment & Plan   06/11/2012 Office Visit Written 06/11/2012 11:10 AM by Dyann Kief, PA     Patient's blood pressure is stable today. He did have some dizziness when he first came home from the hospital but this has resolved. Continue current medicines at this time.    Hyperglycemia   Last Assessment & Plan   06/11/2012 Office Visit Written 06/11/2012 11:11 AM by Dyann Kief, PA      Patient's hemoglobin A1c was 5.8 his blood sugars were 106-129 in the hospital. He is trying to cut back on his sugar intake.  Followup with his primary M.D. Concerning this.  Gout   Last Assessment & Plan   06/11/2012 Office Visit Written 06/11/2012 11:12 AM by Dyann Kief, PA     Patient has been unable to exercise because of gout. He is seeing his primary care for this today.        Imaging: Ct Head Wo Contrast  08/09/2012   *RADIOLOGY REPORT*  Clinical Data:  Head injury.  On anticoagulants.  CT HEAD WITHOUT CONTRAST  Technique:  Contiguous axial images were obtained from the base of the skull through the vertex without contrast  Comparison:  None.  Findings:  The brain has a normal appearance without evidence for hemorrhage, acute infarction, hydrocephalus, or mass lesion.  There is no extra axial fluid collection.  The skull and paranasal sinuses are normal.  IMPRESSION: Normal CT of the head without contrast.   Original Report Authenticated By: Janeece Riggers, M.D.

## 2012-08-29 NOTE — Progress Notes (Signed)
   HPI: [name] is a 52 year old patient of Dr. Peter Graves we are following for ongoing assessment and treatment of CAD, status post hospitalization in 2014 for non-ST elevation MI with drug-eluting stent to the mid RCA, ramus intermediate, and left circumflex on 06/04/2012. Patient was begun on dual antiplatelet therapy. He was last seen in the office by Marcelino Duster and PA February, 2014.   Some complaints of transient dizziness, and GERD symptoms. No chest pains.  No Known Allergies  Current Outpatient Prescriptions  Medication Sig Dispense Refill  . aspirin EC 81 MG EC tablet Take 1 tablet (81 mg total) by mouth daily.      Marland Kitchen atorvastatin (LIPITOR) 80 MG tablet Take 1 tablet (80 mg total) by mouth daily.  30 tablet  6  . lisinopril (PRINIVIL,ZESTRIL) 20 MG tablet Take 20 mg by mouth daily.      . metoprolol (LOPRESSOR) 50 MG tablet Take 1 tablet (50 mg total) by mouth 2 (two) times daily.  60 tablet  6  . nitroGLYCERIN (NITROSTAT) 0.4 MG SL tablet Place 1 tablet (0.4 mg total) under the tongue every 5 (five) minutes as needed for chest pain (up to 3 doses).  25 tablet  4  . nystatin-triamcinolone (MYCOLOG II) cream Apply 1 application topically 2 (two) times daily.      . Ticagrelor (BRILINTA) 90 MG TABS tablet Take 1 tablet (90 mg total) by mouth 2 (two) times daily.  60 tablet  10   No current facility-administered medications for this visit.    Past Medical History  Diagnosis Date  . Hypertension   . Hypercholesteremia   . Coronary artery disease     a. NSTEMI 05/2012 s/p PTCA/DES of the mid RCA, Ramus intermediate, and mid left circumflex arteries  . Arthritis   . Hyperglycemia   . Myocardial infarct     Past Surgical History  Procedure Laterality Date  . Hernia repair    . Back surgery    . Neck surgery    . Cardiac catheterization    . Cardiac stents      ZOX:WRUEAV of systems complete and found to be negative unless listed above  PHYSICAL EXAM BP 108/72  Pulse 71   Ht 6' (1.829 m)  Wt 235 lb 12 oz (106.935 kg)  BMI 31.97 kg/m2  General: Well developed, well nourished, in no acute distress Head: Eyes PERRLA, No xanthomas.   Normal cephalic and atramatic  Lungs: Clear bilaterally to auscultation and percussion. Heart: HRRR S1 S2, without MRG.  Pulses are 2+ & equal.            No carotid bruit. No JVD.  No abdominal bruits. No femoral bruits. Abdomen: Bowel sounds are positive, abdomen soft and non-tender without masses or                  Hernia's noted. Msk:  Back normal, normal gait. Normal strength and tone for age. Extremities: No clubbing, cyanosis or edema.  DP +1 Neuro: Alert and oriented X 3. Psych:  Good affect, responds appropriately  :  ASSESSMENT AND PLAN

## 2012-08-29 NOTE — Assessment & Plan Note (Signed)
Ongoing risk management concerning low cholesterol diet. Will followup with lipids and ALT since 6 months.

## 2012-08-30 LAB — BASIC METABOLIC PANEL
BUN: 21 mg/dL (ref 6–23)
Chloride: 103 mEq/L (ref 96–112)
Potassium: 4.3 mEq/L (ref 3.5–5.3)

## 2012-08-30 LAB — CBC
HCT: 38.8 % — ABNORMAL LOW (ref 39.0–52.0)
MCHC: 34.5 g/dL (ref 30.0–36.0)
Platelets: 225 10*3/uL (ref 150–400)
RDW: 14.5 % (ref 11.5–15.5)
WBC: 8 10*3/uL (ref 4.0–10.5)

## 2012-09-01 ENCOUNTER — Encounter: Payer: Self-pay | Admitting: *Deleted

## 2012-09-05 ENCOUNTER — Encounter: Payer: Self-pay | Admitting: *Deleted

## 2012-09-05 ENCOUNTER — Ambulatory Visit (INDEPENDENT_AMBULATORY_CARE_PROVIDER_SITE_OTHER): Payer: Managed Care, Other (non HMO) | Admitting: *Deleted

## 2012-09-05 VITALS — BP 117/72 | HR 63 | Ht 72.0 in | Wt 232.2 lb

## 2012-09-05 DIAGNOSIS — I10 Essential (primary) hypertension: Secondary | ICD-10-CM

## 2012-09-05 MED ORDER — METOPROLOL TARTRATE 25 MG PO TABS: 25.0000 mg | ORAL_TABLET | Freq: Two times a day (BID) | ORAL | Status: DC

## 2012-09-05 NOTE — Patient Instructions (Addendum)
Your physician recommends that you schedule a follow-up appointment in: 2-3 WEEKS WITH KL   Your physician has recommended you make the following change in your medication:   1) CONTINUE CURRENT DOSAGE OF LISINOPRIL 10MG  DAILY 2) DECREASE METOPROLOL TO 25MG  IN THE AM AND 25MG  IN THE PM  Your physician recommends that you return for lab work in: TODAY (SLIPS GIVEN-MAGNESIUM)

## 2012-09-05 NOTE — Progress Notes (Signed)
Pt presented to BP check today per recent medication changes Pt advised he feels good today, BP noted at PCP office this am 120/60 HR 65 Pt did note several concerns last week as follows:  Started new lowered dose of lisinopril on Friday 08-29-12, Monday 09-01-12 noted tingling all over,dizzyness,lightheaded, no energy and no appetite, Tuesday 09-02-12 pt decided on his own to lower the morning dosage of metoprolol to half the normal dosage and take 25mg  in the am and take 50mg  in the evening, pt has continued this dosage to date, early Wednesday pt noted another episode of dizzyness, lightheadedness, no energy and tingling per getting off from work, pt went to PCP the following morning and was given prednisone and hydrocodone 10-325mg  per gout flare up as well as injection of prednisone, pt has not felt any more episodes of dizzyness/tingling/fatigue since Wednesday.  This nurse spoke to [facility] NP and was given verbal orders as follows:  1) DECREASE METOPROLOL TO 25MG  IN THE AM AND 25MG  IN THE PM 2) LAB DRAW OF MAGNESIUM,BMET (NOTED BMET DRAWN 08-29-12-PRINTED) WILL HAVE PT GET MAG LEVEL DRAWN TODAY 3) FOLLOW UP WITH KL IN 2-3 WEEKS 4) KEEP TAKING LISINOPRIL 10MG  DAILY AS DIRECTED   Dani Wallner A. Lee-Ann Gal L.P.N.  Last OV D/C Summary and VS 08-29-12:  BP 108/72  Pulse 71  Ht 6' (1.829 m)  Wt 235 lb 12 oz (106.935 kg)  BMI 31.97 kg/m2  Your physician recommends that you schedule a follow-up appointment in:6 MONTHS WITH KL AND ONE WEEK TO CHECK BLOOD PRESSURE WITH THE NURSE  Your physician has recommended you make the following change in your medication:  1) DECREASE LISINOPRIL TO 10MG  ONCE DAILY ( YOU CAN TAKE HALF OF 20MG  UNTIL COMPLETED)  2) START PEPCID 20MG  ONCE DAILY  Your physician recommends that you return for lab work in: TODAY (SLIPS GIVEN-BMET,CBC)

## 2012-09-06 LAB — MAGNESIUM: Magnesium: 2.1 mg/dL (ref 1.5–2.5)

## 2012-09-09 ENCOUNTER — Encounter: Payer: Self-pay | Admitting: *Deleted

## 2012-09-11 ENCOUNTER — Encounter: Payer: Self-pay | Admitting: Adult Health

## 2012-09-18 NOTE — Progress Notes (Signed)
HPI: [name] 52 year old patient of Dr. Peter Swaziland we are following for ongoing assessment treatment of CAD, status post hospitalization for non-ST elevated MI with drug-eluting stent to the mid RCA, ramus intermediate, and left circumflex on 06/04/2012. Patient was last seen in the office on 08/29/2012 with complaints of transient dizziness and GERD symptoms. The patient was placed on Pepcid 20 mg daily, CBC was to be checked for evaluation of hemoglobin and hematocrit in the setting of antiplatelet therapy. His was mildly hypotensive in the office with orthostatics completed and he was found to be positive. I decreased his lisinopril to 20 mg daily to 10 mg daily.    He had a followup blood pressure check in the office on 09/10/2012 he did state on that visit that he decreased his metoprolol on his own, decrease the morning dose is 25 mg and keeping p.m. doses of 50 mg. This was due to lightheadness and tingling in his hands. He was seen by his PCP the following day and was given prednisone and hydrocodone for a gout flareup. Since treatment he had had no more episodes of dizziness or fatigue. The metoprolol was decreased to 25 mg twice a day. Magnesium level was to be drawn as well. He was to continue lisinopril as directed.  Review of labs: Demonstrated a sodium 1:30 potassium 4.3 chloride 103, creatinine 1.17 magnesium 2.1 hemoglobin 13.4 hematocrit 38.8.   He comes today with no complaints but is hypotensive. It is noted that he has seen his PCP and been treated for gout symptoms. He has some mild fatigue.    No Known Allergies  Current Outpatient Prescriptions  Medication Sig Dispense Refill  . aspirin EC 81 MG EC tablet Take 1 tablet (81 mg total) by mouth daily.      Marland Kitchen atorvastatin (LIPITOR) 80 MG tablet Take 1 tablet (80 mg total) by mouth daily.  30 tablet  6  . lisinopril (PRINIVIL,ZESTRIL) 10 MG tablet Take 1 tablet (10 mg total) by mouth daily.  90 tablet  1  . metoprolol tartrate  (LOPRESSOR) 25 MG tablet Take 1 tablet (25 mg total) by mouth 2 (two) times daily.  180 tablet  1  . nitroGLYCERIN (NITROSTAT) 0.4 MG SL tablet Place 1 tablet (0.4 mg total) under the tongue every 5 (five) minutes as needed for chest pain (up to 3 doses).  25 tablet  4  . nystatin-triamcinolone (MYCOLOG II) cream Apply 1 application topically 2 (two) times daily.      . Ticagrelor (BRILINTA) 90 MG TABS tablet Take 1 tablet (90 mg total) by mouth 2 (two) times daily.  60 tablet  10  . famotidine (PEPCID) 20 MG tablet Take 1 tablet (20 mg total) by mouth daily.  30 tablet  6   No current facility-administered medications for this visit.    Past Medical History  Diagnosis Date  . Hypertension   . Hypercholesteremia   . Coronary artery disease     a. NSTEMI 05/2012 s/p PTCA/DES of the mid RCA, Ramus intermediate, and mid left circumflex arteries  . Arthritis   . Hyperglycemia   . Myocardial infarct     Past Surgical History  Procedure Laterality Date  . Hernia repair    . Back surgery    . Neck surgery    . Cardiac catheterization    . Cardiac stents      ZOX:WRUEAV of systems complete and found to be negative unless listed above  PHYSICAL EXAM BP 95/65  Pulse 77  Ht 6' (1.829 m)  Wt 229 lb (103.874 kg)  BMI 31.05 kg/m2  General: Well developed, well nourished, in no acute distress Head: Eyes PERRLA, No xanthomas.   Normal cephalic and atramatic  Lungs: Clear bilaterally to auscultation and percussion. Heart: HRRR S1 S2, without MRG.  Pulses are 2+ & equal.            No carotid bruit. No JVD.  No abdominal bruits. No femoral bruits. Abdomen: Bowel sounds are positive, abdomen soft and non-tender without masses or                  Hernia's noted. Msk:  Back normal, normal gait. Normal strength and tone for age. Extremities: No clubbing, cyanosis or edema.  DP +1 Neuro: Alert and oriented X 3. Psych:  Good affect, responds appropriately  :  ASSESSMENT AND PLAN

## 2012-09-19 ENCOUNTER — Ambulatory Visit (INDEPENDENT_AMBULATORY_CARE_PROVIDER_SITE_OTHER): Payer: Managed Care, Other (non HMO) | Admitting: Adult Health

## 2012-09-19 ENCOUNTER — Encounter: Payer: Self-pay | Admitting: Adult Health

## 2012-09-19 VITALS — BP 95/65 | HR 77 | Ht 72.0 in | Wt 229.0 lb

## 2012-09-19 DIAGNOSIS — I2510 Atherosclerotic heart disease of native coronary artery without angina pectoris: Secondary | ICD-10-CM

## 2012-09-19 DIAGNOSIS — I214 Non-ST elevation (NSTEMI) myocardial infarction: Secondary | ICD-10-CM

## 2012-09-19 DIAGNOSIS — I959 Hypotension, unspecified: Secondary | ICD-10-CM

## 2012-09-19 DIAGNOSIS — I10 Essential (primary) hypertension: Secondary | ICD-10-CM

## 2012-09-19 DIAGNOSIS — E785 Hyperlipidemia, unspecified: Secondary | ICD-10-CM

## 2012-09-19 MED ORDER — LISINOPRIL 5 MG PO TABS: 5.0000 mg | ORAL_TABLET | Freq: Every day | ORAL | Status: DC

## 2012-09-19 NOTE — Assessment & Plan Note (Signed)
Continue statin. He will need follow up labs in 6 months.

## 2012-09-19 NOTE — Patient Instructions (Signed)
Your physician has recommended you make the following change in your medication: DECREASE Lisinopril 5 mg daily  Your physician recommends that you schedule a follow-up appointment in: 2-3 weeks for BP check.   Your physician wants you to follow-up in: 6 MONTHS You will receive a reminder letter in the mail two months in advance. If you don't receive a letter, please call our office to schedule the follow-up appointment.

## 2012-09-19 NOTE — Assessment & Plan Note (Signed)
No recurrence of chest pain. He has not had to take NTG. He will continue risk management. See him again in 6 months. 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

## 2012-09-19 NOTE — Assessment & Plan Note (Signed)
BP is lower than I would like it. Will decrease lisinopril to 5 mg daily. He is already taking lower dose of metoprolol at 25 mg BID. He will return to have BP check in a couple of weeks for ongoing evaluation.  He verbalizes understanding.

## 2012-10-03 ENCOUNTER — Ambulatory Visit (INDEPENDENT_AMBULATORY_CARE_PROVIDER_SITE_OTHER): Payer: Managed Care, Other (non HMO) | Admitting: *Deleted

## 2012-10-03 VITALS — BP 116/77 | HR 87 | Ht 72.0 in | Wt 230.0 lb

## 2012-10-03 DIAGNOSIS — I10 Essential (primary) hypertension: Secondary | ICD-10-CM

## 2012-10-03 NOTE — Patient Instructions (Addendum)
Your physician recommends that you schedule a follow-up appointment in: TO BE DETERMINED   

## 2012-10-03 NOTE — Progress Notes (Signed)
Pt presented to nurse visit today to recheck BP due to recent medication changes Pt voiced no complaints today Please advise if any further instructions are neccessary   [place] A. Mimie Goering L.P.N.  Last OV D/C Summary and VS:  BP 95/65  Pulse 77  Ht 6' (1.829 m)  Wt 229 lb (103.874 kg)  BMI 31.05 kg/m2  Your physician has recommended you make the following change in your medication: DECREASE Lisinopril 5 mg daily  Your physician recommends that you schedule a follow-up appointment in: 2-3 weeks for BP check.  Your physician wants you to follow-up in: 6 MONTHS You will receive a reminder letter in the mail two months in advance. If you don't receive a letter, please call our office to schedule the follow-up appointment.

## 2012-10-06 NOTE — Progress Notes (Signed)
No changes planned

## 2012-10-07 NOTE — Progress Notes (Signed)
.  left message to have patient return my call.  

## 2012-10-15 NOTE — Progress Notes (Signed)
.  left message to have patient return my call with pt family member 

## 2012-10-16 NOTE — Progress Notes (Signed)
Left detailed message per pt verbal permission during NV, to advise no changes call our office with any further information as needed

## 2012-12-29 ENCOUNTER — Other Ambulatory Visit: Payer: Self-pay | Admitting: *Deleted

## 2012-12-29 MED ORDER — ATORVASTATIN CALCIUM 80 MG PO TABS: 80.0000 mg | ORAL_TABLET | Freq: Every day | ORAL | Status: DC

## 2013-01-06 ENCOUNTER — Encounter: Payer: Self-pay | Admitting: Cardiology

## 2013-03-20 ENCOUNTER — Ambulatory Visit (INDEPENDENT_AMBULATORY_CARE_PROVIDER_SITE_OTHER): Payer: Managed Care, Other (non HMO) | Admitting: Cardiology

## 2013-03-20 ENCOUNTER — Encounter: Payer: Self-pay | Admitting: Cardiology

## 2013-03-20 VITALS — BP 137/85 | HR 68 | Ht 72.0 in | Wt 241.0 lb

## 2013-03-20 DIAGNOSIS — E782 Mixed hyperlipidemia: Secondary | ICD-10-CM

## 2013-03-20 DIAGNOSIS — I10 Essential (primary) hypertension: Secondary | ICD-10-CM

## 2013-03-20 DIAGNOSIS — I2510 Atherosclerotic heart disease of native coronary artery without angina pectoris: Secondary | ICD-10-CM

## 2013-03-20 NOTE — Assessment & Plan Note (Addendum)
Symptomatically stable on medical therapy. No changes were made today. We will likely stop Brilinta after one year treatment following his interventions this February. Followup scheduled in 3 months. 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 He will need surveillance stress testing over time.

## 2013-03-20 NOTE — Progress Notes (Signed)
Clinical Summary Mr. Duhe is a 52 y.o.male last seen in the office by Ms. Lawrence NP back in June, a former patient of Dr. Swaziland. This is our first meeting in the office. He reports compliance with his medications, no angina symptoms or progressive shortness of breath. He is a Naval architect by profession, admits that he does not exercise with any regularity. He has been trying to watch his diet, not able to lose any weight however.  Lab work from March of this year showed cholesterol 99, triglycerides 92, HDL 25, LDL 56, normal LFTs. He reports compliance with Lipitor.  We discussed his medications, likelihood that we will stop Brilinta after a year treatment following his NSTEMI and DES interventions this past February. He has had some intermittent nose bleeding, also indigestion symptoms.  Today we discussed diet and exercise.  No Known Allergies  Current Outpatient Prescriptions  Medication Sig Dispense Refill  . aspirin EC 81 MG EC tablet Take 1 tablet (81 mg total) by mouth daily.      Marland Kitchen atorvastatin (LIPITOR) 80 MG tablet Take 1 tablet (80 mg total) by mouth daily.  30 tablet  6  . famotidine (PEPCID) 20 MG tablet Take 1 tablet (20 mg total) by mouth daily.  30 tablet  6  . lisinopril (PRINIVIL,ZESTRIL) 5 MG tablet Take 1 tablet (5 mg total) by mouth daily.  90 tablet  1  . metoprolol tartrate (LOPRESSOR) 25 MG tablet Take 1 tablet (25 mg total) by mouth 2 (two) times daily.  180 tablet  1  . nitroGLYCERIN (NITROSTAT) 0.4 MG SL tablet Place 1 tablet (0.4 mg total) under the tongue every 5 (five) minutes as needed for chest pain (up to 3 doses).  25 tablet  4  . nystatin-triamcinolone (MYCOLOG II) cream Apply 1 application topically 2 (two) times daily.      . Ticagrelor (BRILINTA) 90 MG TABS tablet Take 1 tablet (90 mg total) by mouth 2 (two) times daily.  60 tablet  10   No current facility-administered medications for this visit.    Past Medical History  Diagnosis Date  .  Essential hypertension, benign   . Hypercholesteremia   . Coronary artery disease     NSTEMI 05/2012 s/p PTCA/DES of the mid RCA, ramus intermediate, and mid left circumflex, LVEF 55-60%  . Arthritis   . Hyperglycemia   . NSTEMI (non-ST elevated myocardial infarction)     February 2014    Social History Mr. Isabell reports that he quit smoking about 11 years ago. His smoking use included Cigarettes. He smoked 0.00 packs per day. He has never used smokeless tobacco. Mr. Chittum reports that he does not drink alcohol.  Review of Systems No palpitations or syncope. No claudication. No orthopnea or PND. Otherwise negative.  Physical Examination Filed Vitals:   03/20/13 0942  BP: 137/85  Pulse: 68   Filed Weights   03/20/13 0942  Weight: 241 lb (109.317 kg)   Overweight male, comfortable at rest. HEENT: Conjunctiva and lids normal, oropharynx clear. Neck: Supple, no elevated JVP or carotid bruits, no thyromegaly. Lungs: Clear to auscultation, nonlabored breathing at rest. Cardiac: Regular rate and rhythm, no S3 or significant systolic murmur, no pericardial rub. Abdomen: Soft, nontender, bowel sounds present. Extremities: No pitting edema, distal pulses 2+. Skin: Warm and dry. Musculoskeletal: No kyphosis. Neuropsychiatric: Alert and oriented x3, affect grossly appropriate.   Problem List and Plan   Coronary atherosclerosis of native coronary artery Symptomatically stable on medical  therapy. No changes were made today. We will likely stop Brilinta after one year treatment following his interventions this February. Followup scheduled in 3 months. 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 He will need surveillance stress testing over time.  Essential hypertension, benign No change to current regimen. Discussed diet and exercise.  Hyperlipidemia Anticipate followup FLP and LFT for next visit.    Jonelle Sidle, M.D., F.A.C.C.

## 2013-03-20 NOTE — Patient Instructions (Addendum)
Your physician recommends that you schedule a follow-up appointment in: 3 MONTHS  Your physician recommends that you continue on your current medications as directed. Please refer to the Current Medication list given to you today.  Your physician recommends that you return for lab work in: 3 MONTHS   YOUR LAB WORK IS DUE MARCH  Please go to [facility], located across the street from [facility]. The office is located on the second floor with hours of operations as follows:   Monday - Friday 7am until 6pm, Saturday 8am until 12 noon   _X__DO NOT EAT OR DRINK AFTER MIDNIGHT THE EVENING PRIOR TO LABWORK   ___ YOUR LABWORK IS NOT FASTING--YOU MAY EAT PRIOR TO LABWORK

## 2013-03-20 NOTE — Assessment & Plan Note (Addendum)
No change to current regimen. Discussed diet and exercise.

## 2013-03-20 NOTE — Assessment & Plan Note (Signed)
Anticipate followup FLP and LFT for next visit.

## 2013-03-23 ENCOUNTER — Ambulatory Visit: Payer: Managed Care, Other (non HMO) | Admitting: Cardiology

## 2013-05-03 ENCOUNTER — Other Ambulatory Visit: Payer: Self-pay | Admitting: Cardiology

## 2013-05-11 ENCOUNTER — Telehealth: Payer: Self-pay | Admitting: *Deleted

## 2013-05-11 MED ORDER — TICAGRELOR 90 MG PO TABS: 90.0000 mg | ORAL_TABLET | Freq: Two times a day (BID) | ORAL | Status: DC

## 2013-05-11 NOTE — Telephone Encounter (Signed)
Medication sent via escribe.  

## 2013-05-11 NOTE — Telephone Encounter (Signed)
Pt needs birlinta called

## 2013-06-24 ENCOUNTER — Encounter: Payer: Self-pay | Admitting: *Deleted

## 2013-06-24 LAB — LIPID PANEL
Cholesterol: 138 mg/dL (ref 0–200)
HDL: 34 mg/dL — ABNORMAL LOW (ref >39)
LDL Cholesterol: 74 mg/dL (ref 0–99)
Total CHOL/HDL Ratio: 4.1 Ratio
Triglycerides: 148 mg/dL (ref <150)
VLDL: 30 mg/dL (ref 0–40)

## 2013-06-24 LAB — HEPATIC FUNCTION PANEL
ALT: 25 U/L (ref 0–53)
AST: 22 U/L (ref 0–37)
Albumin: 4.3 g/dL (ref 3.5–5.2)
Alkaline Phosphatase: 49 U/L (ref 39–117)
Bilirubin, Direct: 0.1 mg/dL (ref 0.0–0.3)
Indirect Bilirubin: 0.4 mg/dL (ref 0.2–1.2)
Total Bilirubin: 0.5 mg/dL (ref 0.2–1.2)
Total Protein: 6.5 g/dL (ref 6.0–8.3)

## 2013-06-28 ENCOUNTER — Other Ambulatory Visit: Payer: Self-pay | Admitting: Adult Health

## 2013-06-29 ENCOUNTER — Encounter: Payer: Self-pay | Admitting: Cardiology

## 2013-06-29 ENCOUNTER — Ambulatory Visit (INDEPENDENT_AMBULATORY_CARE_PROVIDER_SITE_OTHER): Payer: Managed Care, Other (non HMO) | Admitting: Cardiology

## 2013-06-29 VITALS — BP 153/89 | HR 69 | Ht 72.0 in | Wt 234.0 lb

## 2013-06-29 DIAGNOSIS — E7800 Pure hypercholesterolemia, unspecified: Secondary | ICD-10-CM

## 2013-06-29 DIAGNOSIS — I10 Essential (primary) hypertension: Secondary | ICD-10-CM

## 2013-06-29 DIAGNOSIS — I2510 Atherosclerotic heart disease of native coronary artery without angina pectoris: Secondary | ICD-10-CM

## 2013-06-29 MED ORDER — CLOPIDOGREL BISULFATE 75 MG PO TABS: 75.0000 mg | ORAL_TABLET | Freq: Every day | ORAL | Status: DC

## 2013-06-29 NOTE — Assessment & Plan Note (Signed)
Continues on Lipitor, recent LDL at goal.

## 2013-06-29 NOTE — Patient Instructions (Signed)
Your physician wants you to follow-up in: 6 months You will receive a reminder letter in the mail two months in advance. If you don't receive a letter, please call our office to schedule the follow-up appointment.   Your physician has recommended you make the following change in your medication:    After you finish Brilinta , you should begin Plavix 75 mg daily   Please get  blood work (LFT"S,Lipid's) just before next office visit    Thank you for choosing [facility] !               Thank you for choosing [facility] !

## 2013-06-29 NOTE — Assessment & Plan Note (Signed)
No change to current regimen. Discussed diet, weight loss, and exercise.

## 2013-06-29 NOTE — Assessment & Plan Note (Signed)
Continue medical therapy, stopping Brilinta with a switch to generic Plavix, otherwise stable. Followup in 6 months. Reinforced diet and exercise plan.

## 2013-06-29 NOTE — Progress Notes (Signed)
    Clinical Summary Seth Graves is a 53 y.o.male last seen in December 2014. He reports clinical stability, specifically no angina symptoms, no progressive shortness of breath, no cardiac hospitalizations. He continues to work as a Administrator.  We discussed his medications. He has completed one year treatment with aspirin and Brilinta. We discussed stopping DAPT, although he was somewhat hesitant. Ultimately decided to switch to aspirin and generic Plavix for now. We also discussed his diet, also reinforced plan for basic exercise regimen.  Recent lab work shows normal LFTs, cholesterol 138, triglycerides 148, HDL 34, LDL 74.   No Known Allergies  Current Outpatient Prescriptions  Medication Sig Dispense Refill  . aspirin EC 81 MG EC tablet Take 1 tablet (81 mg total) by mouth daily.      Marland Kitchen atorvastatin (LIPITOR) 80 MG tablet Take 1 tablet (80 mg total) by mouth daily.  30 tablet  6  . lisinopril (PRINIVIL,ZESTRIL) 5 MG tablet TAKE ONE TABLET BY MOUTH ONCE DAILY  90 tablet  3  . metoprolol tartrate (LOPRESSOR) 25 MG tablet TAKE ONE TABLET BY MOUTH TWICE DAILY  180 tablet  3  . nitroGLYCERIN (NITROSTAT) 0.4 MG SL tablet Place 1 tablet (0.4 mg total) under the tongue every 5 (five) minutes as needed for chest pain (up to 3 doses).  25 tablet  4  . nystatin-triamcinolone (MYCOLOG II) cream Apply 1 application topically 2 (two) times daily.      . clopidogrel (PLAVIX) 75 MG tablet Take 1 tablet (75 mg total) by mouth daily.  90 tablet  3   No current facility-administered medications for this visit.    Past Medical History  Diagnosis Date  . Essential hypertension, benign   . Hypercholesteremia   . Coronary artery disease     NSTEMI 05/2012 s/p PTCA/DES of the mid RCA, ramus intermediate, and mid left circumflex, LVEF 55-60%  . Arthritis   . Hyperglycemia   . NSTEMI (non-ST elevated myocardial infarction)     February 2014    Social History [name] reports that he quit smoking about  12 years ago. His smoking use included Cigarettes. He smoked 0.00 packs per day. He has never used smokeless tobacco. [name] reports that he does not drink alcohol.  Review of Systems No palpitations, dizziness, syncope. No major bleeding episodes, has occasional nose bleed. Stable appetite. Otherwise negative.  Physical Examination Filed Vitals:   06/29/13 1350  BP: 153/89  Pulse: 69   Filed Weights   06/29/13 1350  Weight: 234 lb (106.142 kg)    Overweight male, comfortable at rest.  HEENT: Conjunctiva and lids normal, oropharynx clear.  Neck: Supple, no elevated JVP or carotid bruits, no thyromegaly.  Lungs: Clear to auscultation, nonlabored breathing at rest.  Cardiac: Regular rate and rhythm, no S3 or significant systolic murmur, no pericardial rub.  Abdomen: Soft, nontender, bowel sounds present.  Extremities: No pitting edema, distal pulses 2+.  Skin: Warm and dry.  Musculoskeletal: No kyphosis.  Neuropsychiatric: Alert and oriented x3, affect grossly appropriate.   Problem List and Plan   Coronary atherosclerosis of native coronary artery Continue medical therapy, stopping Brilinta with a switch to generic Plavix, otherwise stable. Followup in 6 months. Reinforced diet and exercise plan.  Essential hypertension, benign No change to current regimen. Discussed diet, weight loss, and exercise.  Hyperlipidemia Continues on Lipitor, recent LDL at goal.    Satira Sark, M.D., F.A.C.C.

## 2013-08-02 ENCOUNTER — Other Ambulatory Visit: Payer: Self-pay | Admitting: Adult Health

## 2013-12-22 LAB — LIPID PANEL
CHOL/HDL RATIO: 4.1 ratio
Cholesterol: 160 mg/dL (ref 0–200)
HDL: 39 mg/dL — AB (ref >39)
LDL Cholesterol: 92 mg/dL (ref 0–99)
TRIGLYCERIDES: 146 mg/dL (ref <150)
VLDL: 29 mg/dL (ref 0–40)

## 2013-12-28 ENCOUNTER — Ambulatory Visit (INDEPENDENT_AMBULATORY_CARE_PROVIDER_SITE_OTHER): Payer: Managed Care, Other (non HMO) | Admitting: Cardiology

## 2013-12-28 ENCOUNTER — Encounter: Payer: Self-pay | Admitting: Cardiology

## 2013-12-28 VITALS — BP 122/96 | HR 68 | Ht 72.0 in | Wt 228.0 lb

## 2013-12-28 DIAGNOSIS — I10 Essential (primary) hypertension: Secondary | ICD-10-CM

## 2013-12-28 DIAGNOSIS — E785 Hyperlipidemia, unspecified: Secondary | ICD-10-CM

## 2013-12-28 DIAGNOSIS — I2510 Atherosclerotic heart disease of native coronary artery without angina pectoris: Secondary | ICD-10-CM

## 2013-12-28 NOTE — Assessment & Plan Note (Signed)
Symptomatically stable on medical therapy. No changes made today. He has preferred continuing DAPT. Did discuss to continue to focus on diet, also considering increasing his exercise.

## 2013-12-28 NOTE — Assessment & Plan Note (Signed)
Followup FLP and LFTs for next visit.

## 2013-12-28 NOTE — Patient Instructions (Signed)
Your physician wants you to follow-up in: 6 months You will receive a reminder letter in the mail two months in advance. If you don't receive a letter, please call our office to schedule the follow-up appointment.    Your physician recommends that you continue on your current medications as directed. Please refer to the Current Medication list given to you today.      Please have fasting lab work 1 week prior to next office visit in 6 months  (LFT's,LIPIDS)       Thank you for choosing [facility] !

## 2013-12-28 NOTE — Assessment & Plan Note (Signed)
No change in current regimen. 

## 2013-12-28 NOTE — Progress Notes (Signed)
    Clinical Summary [name] is a 53 y.o.male last seen in March. He has been doing well, no angina symptoms or unusual shortness of breath. 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 He reports compliance with his medications, misses Lipitor rarely he says. Also seems to be following a fairly reasonable diet rich in fruits and vegetables, has cut out red meats as much as he can.  Recent lipid panel showed cholesterol 160, triglycerides 146, HDL 39, LDL 92 (up from 74). We discussed this today. Also talked about trying to initiate a basic exercise regimen.  ECG shows sinus bradycardia, normal.   No Known Allergies  Current Outpatient Prescriptions  Medication Sig Dispense Refill  . aspirin EC 81 MG EC tablet Take 1 tablet (81 mg total) by mouth daily.      Marland Kitchen atorvastatin (LIPITOR) 80 MG tablet TAKE ONE TABLET BY MOUTH ONCE DAILY  90 tablet  3  . clopidogrel (PLAVIX) 75 MG tablet Take 1 tablet (75 mg total) by mouth daily.  90 tablet  3  . lisinopril (PRINIVIL,ZESTRIL) 5 MG tablet TAKE ONE TABLET BY MOUTH ONCE DAILY  90 tablet  3  . metoprolol tartrate (LOPRESSOR) 25 MG tablet TAKE ONE TABLET BY MOUTH TWICE DAILY  180 tablet  3  . nitroGLYCERIN (NITROSTAT) 0.4 MG SL tablet Place 1 tablet (0.4 mg total) under the tongue every 5 (five) minutes as needed for chest pain (up to 3 doses).  25 tablet  4  . nystatin-triamcinolone (MYCOLOG II) cream Apply 1 application topically 2 (two) times daily.       No current facility-administered medications for this visit.    Past Medical History  Diagnosis Date  . Essential hypertension, benign   . Hypercholesteremia   . Coronary artery disease     NSTEMI 05/2012 s/p PTCA/DES of the mid RCA, ramus intermediate, and mid left circumflex, LVEF 55-60%  . Arthritis   . Hyperglycemia   . NSTEMI (non-ST elevated myocardial infarction)     February 2014    Social History Mr. Brindle reports that he quit smoking about 12 years ago. His smoking use  included Cigarettes. He smoked 0.00 packs per day. He has never used smokeless tobacco. Mr. Oelke reports that he does not drink alcohol.  Review of Systems Other systems reviewed and negative.  Physical Examination Filed Vitals:   12/28/13 1401  BP: 122/96  Pulse: 68   Filed Weights   12/28/13 1401  Weight: 228 lb (103.42 kg)    Appears comfortable at rest.  HEENT: Conjunctiva and lids normal, oropharynx clear.  Neck: Supple, no elevated JVP or carotid bruits, no thyromegaly.  Lungs: Clear to auscultation, nonlabored breathing at rest.  Cardiac: Regular rate and rhythm, no S3 or significant systolic murmur, no pericardial rub.  Abdomen: Soft, nontender, bowel sounds present.  Extremities: No pitting edema, distal pulses 2+.  Skin: Warm and dry.  Musculoskeletal: No kyphosis.  Neuropsychiatric: Alert and oriented x3, affect grossly appropriate.   Problem List and Plan   Coronary atherosclerosis of native coronary artery Symptomatically stable on medical therapy. No changes made today. He has preferred continuing DAPT. Did discuss to continue to focus on diet, also considering increasing his exercise.  Hyperlipidemia Followup FLP and LFTs for next visit.  Essential hypertension, benign No change in current regimen.    Satira Sark, M.D., F.A.C.C.

## 2014-03-19 ENCOUNTER — Other Ambulatory Visit (HOSPITAL_COMMUNITY): Payer: Self-pay | Admitting: Family Medicine

## 2014-03-19 ENCOUNTER — Ambulatory Visit (HOSPITAL_COMMUNITY)
Admission: RE | Admit: 2014-03-19 | Discharge: 2014-03-19 | Disposition: A | Discharge status: Home / Self Care | Payer: Managed Care, Other (non HMO) | Source: Ambulatory Visit | Attending: Family Medicine | Admitting: Family Medicine

## 2014-03-19 DIAGNOSIS — M79671 Pain in right foot: Secondary | ICD-10-CM | POA: Diagnosis present

## 2014-03-25 ENCOUNTER — Encounter (HOSPITAL_COMMUNITY): Payer: Self-pay | Admitting: Cardiology

## 2014-05-09 ENCOUNTER — Other Ambulatory Visit: Payer: Self-pay | Admitting: Cardiology

## 2014-05-10 ENCOUNTER — Other Ambulatory Visit: Payer: Self-pay | Admitting: Cardiology

## 2014-05-10 MED ORDER — METOPROLOL TARTRATE 25 MG PO TABS: 25.0000 mg | ORAL_TABLET | Freq: Two times a day (BID) | ORAL | Status: DC

## 2014-05-10 NOTE — Telephone Encounter (Signed)
E-scribed refill 

## 2014-05-10 NOTE — Telephone Encounter (Signed)
Received fax refill request  Rx # [id number] Medication:  Metoprolol tart 25 mg tab Qty 180 Sig:  Take one tablet by mouth twice daily Physician:  Domenic Polite

## 2014-05-26 ENCOUNTER — Telehealth: Payer: Self-pay | Admitting: Cardiology

## 2014-05-26 NOTE — Telephone Encounter (Signed)
Patient scheduled to have tooth pulled this afternoon.  Does he need to stop Plavix prior to? / tgs

## 2014-05-26 NOTE — Telephone Encounter (Signed)
Pt amy remain on Plavix for tooth extraction,pt aware

## 2014-07-13 ENCOUNTER — Other Ambulatory Visit: Payer: Self-pay | Admitting: Cardiology

## 2014-07-13 LAB — LIPID PANEL
Cholesterol: 164 mg/dL (ref 0–200)
HDL: 33 mg/dL — ABNORMAL LOW (ref >=40)
LDL CALC: 95 mg/dL (ref 0–99)
Total CHOL/HDL Ratio: 5 Ratio
Triglycerides: 181 mg/dL — ABNORMAL HIGH (ref <150)
VLDL: 36 mg/dL (ref 0–40)

## 2014-07-13 LAB — HEPATIC FUNCTION PANEL
ALK PHOS: 54 U/L (ref 39–117)
ALT: 22 U/L (ref 0–53)
AST: 25 U/L (ref 0–37)
Albumin: 4 g/dL (ref 3.5–5.2)
BILIRUBIN DIRECT: 0.1 mg/dL (ref 0.0–0.3)
BILIRUBIN TOTAL: 0.8 mg/dL (ref 0.2–1.2)
Indirect Bilirubin: 0.7 mg/dL (ref 0.2–1.2)
Total Protein: 6.8 g/dL (ref 6.0–8.3)

## 2014-07-18 ENCOUNTER — Other Ambulatory Visit: Payer: Self-pay | Admitting: Adult Health

## 2014-07-21 ENCOUNTER — Encounter: Payer: Self-pay | Admitting: Cardiology

## 2014-07-21 ENCOUNTER — Ambulatory Visit (INDEPENDENT_AMBULATORY_CARE_PROVIDER_SITE_OTHER): Payer: Managed Care, Other (non HMO) | Admitting: Cardiology

## 2014-07-21 VITALS — BP 110/68 | HR 85 | Ht 72.0 in | Wt 228.0 lb

## 2014-07-21 DIAGNOSIS — E782 Mixed hyperlipidemia: Secondary | ICD-10-CM | POA: Diagnosis not present

## 2014-07-21 DIAGNOSIS — I2510 Atherosclerotic heart disease of native coronary artery without angina pectoris: Secondary | ICD-10-CM

## 2014-07-21 DIAGNOSIS — I10 Essential (primary) hypertension: Secondary | ICD-10-CM | POA: Diagnosis not present

## 2014-07-21 NOTE — Patient Instructions (Signed)
Your physician wants you to follow-up in: 6 months with Dr.McDowell You will receive a reminder letter in the mail two months in advance. If you don't receive a letter, please call our office to schedule the follow-up appointment.     Your physician recommends that you continue on your current medications as directed. Please refer to the Current Medication list given to you today.     Thank you for choosing [facility] !        

## 2014-07-21 NOTE — Progress Notes (Signed)
Cardiology Office Note  Date: 07/21/2014   ID: Seth Graves, DOB 05-08-1960, MRN [id number]  PCP: Rocky Morel, MD  Primary Cardiologist: Rozann Lesches, MD   Chief Complaint  Patient presents with  . Coronary Artery Disease  . Hypertension  . Hyperlipidemia    History of Present Illness: Seth Graves is a 54 y.o. male last seen in September 2015. He presents for a routine visit. He states that he has started an exercise plan at home, reports no angina, NYHA class I dyspnea. He states that he has been taking his medications regularly, no bleeding problems on DAPT. We have continued both aspirin and Plavix in light of 3 DES.  He continues on Lipitor, reports no obvious side effects. States he just had lab work done in follow-up last week, we have not gotten the results yet. Nursing is checking into this. Last lipid panel is noted below from September 2015.   Past Medical History  Diagnosis Date  . Essential hypertension, benign   . Hypercholesteremia   . Coronary artery disease     NSTEMI 05/2012 s/p PTCA/DES of the mid RCA, ramus intermediate, and mid left circumflex, LVEF 55-60%  . Arthritis   . Hyperglycemia   . NSTEMI (non-ST elevated myocardial infarction)     February 2014     Current Outpatient Prescriptions  Medication Sig Dispense Refill  . aspirin EC 81 MG EC tablet Take 1 tablet (81 mg total) by mouth daily.    Marland Kitchen atorvastatin (LIPITOR) 80 MG tablet TAKE ONE TABLET BY MOUTH ONCE DAILY 90 tablet 3  . clopidogrel (PLAVIX) 75 MG tablet Take 1 tablet (75 mg total) by mouth daily. 90 tablet 3  . HYDROcodone-acetaminophen (NORCO/VICODIN) 5-325 MG per tablet     . lisinopril (PRINIVIL,ZESTRIL) 5 MG tablet TAKE ONE TABLET BY MOUTH ONCE DAILY 90 tablet 2  . metoprolol tartrate (LOPRESSOR) 25 MG tablet Take 1 tablet (25 mg total) by mouth 2 (two) times daily. 180 tablet 3  . nitroGLYCERIN (NITROSTAT) 0.4 MG SL tablet Place 1 tablet (0.4 mg total) under the tongue  every 5 (five) minutes as needed for chest pain (up to 3 doses). 25 tablet 4  . nystatin-triamcinolone (MYCOLOG II) cream Apply 1 application topically 2 (two) times daily.     No current facility-administered medications for this visit.    Allergies:  Review of patient's allergies indicates no known allergies.   Social History: The patient  reports that he quit smoking about 13 years ago. His smoking use included Cigarettes. He started smoking about 34 years ago. He has never used smokeless tobacco. He reports that he does not drink alcohol or use illicit drugs.   ROS:  Please see the history of present illness. Otherwise, complete review of systems is positive for knee pain.  All other systems are reviewed and negative.   Physical Exam: VS:  BP 110/68 mmHg  Pulse 85  Ht 6' (1.829 m)  Wt 228 lb (103.42 kg)  BMI 30.92 kg/m2  SpO2 96%, BMI Body mass index is 30.92 kg/(m^2).  Wt Readings from Last 3 Encounters:  07/21/14 228 lb (103.42 kg)  12/28/13 228 lb (103.42 kg)  06/29/13 234 lb (106.142 kg)     Appears comfortable at rest.  HEENT: Conjunctiva and lids normal, oropharynx clear.  Neck: Supple, no elevated JVP or carotid bruits, no thyromegaly.  Lungs: Clear to auscultation, nonlabored breathing at rest.  Cardiac: Regular rate and rhythm, no S3 or significant systolic  murmur, no pericardial rub.  Abdomen: Soft, nontender, bowel sounds present.  Extremities: No pitting edema, distal pulses 2+.  Skin: Warm and dry.  Musculoskeletal: No kyphosis.  Neuropsychiatric: Alert and oriented x3, affect grossly appropriate.   ECG: ECG is not ordered today.  Recent Labwork:     Component Value Date/Time   CHOL 160 12/22/2013 0921   TRIG 146 12/22/2013 0921   HDL 39* 12/22/2013 0921   CHOLHDL 4.1 12/22/2013 0921   VLDL 29 12/22/2013 0921   LDLCALC 92 12/22/2013 0921    ASSESSMENT AND PLAN:  1. Symptomatically stable with CAD status post DES interventions in 2014 as  documented above. We will continue on aspirin and Plavix, he is tolerating this without any bleeding problems. Continue current medical regimen, I encouraged him to continue a regular exercise plan.  2. Essential hypertension, blood pressure is normal today.  3. Hyperlipidemia, continues on Lipitor. Will obtain his recent follow-up lab work.  Current medicines were reviewed at length with the patient today.  Disposition: FU with me in 6 months.   Signed, Satira Sark, MD, [facility] 07/21/2014 9:25 AM    [facility] [address] [place], [place] [address] Phone: [phone]; Fax: [phone]

## 2014-07-25 ENCOUNTER — Other Ambulatory Visit: Payer: Self-pay | Admitting: Cardiology

## 2014-08-08 ENCOUNTER — Other Ambulatory Visit: Payer: Self-pay | Admitting: Cardiology

## 2014-08-09 ENCOUNTER — Other Ambulatory Visit: Payer: Self-pay | Admitting: Cardiology

## 2014-08-09 NOTE — Telephone Encounter (Signed)
Received fax refill request  Rx # [id number] Medication:  Lipitor 80 mg tab Qty 90 Sig:  Take one tablet by mouth once daily Physician:  Domenic Polite

## 2014-09-12 ENCOUNTER — Emergency Department (HOSPITAL_COMMUNITY)
Admission: EM | Admit: 2014-09-12 | Discharge: 2014-09-12 | Disposition: A | Discharge status: Home / Self Care | Payer: Managed Care, Other (non HMO) | Attending: Emergency Medicine | Admitting: Emergency Medicine

## 2014-09-12 ENCOUNTER — Encounter (HOSPITAL_COMMUNITY): Payer: Self-pay

## 2014-09-12 ENCOUNTER — Emergency Department (HOSPITAL_COMMUNITY): Payer: Managed Care, Other (non HMO)

## 2014-09-12 DIAGNOSIS — I252 Old myocardial infarction: Secondary | ICD-10-CM | POA: Insufficient documentation

## 2014-09-12 DIAGNOSIS — Z7902 Long term (current) use of antithrombotics/antiplatelets: Secondary | ICD-10-CM | POA: Insufficient documentation

## 2014-09-12 DIAGNOSIS — M109 Gout, unspecified: Secondary | ICD-10-CM | POA: Insufficient documentation

## 2014-09-12 DIAGNOSIS — E780 Pure hypercholesterolemia: Secondary | ICD-10-CM | POA: Insufficient documentation

## 2014-09-12 DIAGNOSIS — Z87891 Personal history of nicotine dependence: Secondary | ICD-10-CM | POA: Insufficient documentation

## 2014-09-12 DIAGNOSIS — Z7982 Long term (current) use of aspirin: Secondary | ICD-10-CM | POA: Diagnosis not present

## 2014-09-12 DIAGNOSIS — Z9861 Coronary angioplasty status: Secondary | ICD-10-CM | POA: Insufficient documentation

## 2014-09-12 DIAGNOSIS — M25562 Pain in left knee: Secondary | ICD-10-CM | POA: Diagnosis present

## 2014-09-12 DIAGNOSIS — I2510 Atherosclerotic heart disease of native coronary artery without angina pectoris: Secondary | ICD-10-CM | POA: Insufficient documentation

## 2014-09-12 DIAGNOSIS — Z79899 Other long term (current) drug therapy: Secondary | ICD-10-CM | POA: Diagnosis not present

## 2014-09-12 DIAGNOSIS — Y9389 Activity, other specified: Secondary | ICD-10-CM | POA: Insufficient documentation

## 2014-09-12 DIAGNOSIS — I10 Essential (primary) hypertension: Secondary | ICD-10-CM | POA: Diagnosis not present

## 2014-09-12 DIAGNOSIS — M1990 Unspecified osteoarthritis, unspecified site: Secondary | ICD-10-CM | POA: Insufficient documentation

## 2014-09-12 DIAGNOSIS — Z9889 Other specified postprocedural states: Secondary | ICD-10-CM | POA: Diagnosis not present

## 2014-09-12 DIAGNOSIS — M7052 Other bursitis of knee, left knee: Secondary | ICD-10-CM

## 2014-09-12 LAB — URIC ACID: Uric Acid, Serum: 8.1 mg/dL — ABNORMAL HIGH (ref 4.4–7.6)

## 2014-09-12 MED ORDER — COLCHICINE 0.6 MG PO TABS: 0.6000 mg | ORAL_TABLET | Freq: Two times a day (BID) | ORAL | Status: DC

## 2014-09-12 MED ORDER — KETOROLAC TROMETHAMINE 60 MG/2ML IM SOLN
60.0000 mg | Freq: Once | INTRAMUSCULAR | Status: AC
  Administered 2014-09-12: 60 mg via INTRAMUSCULAR
  Filled 2014-09-12: qty 2

## 2014-09-12 MED ORDER — NAPROXEN 500 MG PO TABS: ORAL_TABLET | ORAL | Status: DC

## 2014-09-12 NOTE — ED Provider Notes (Signed)
CSN: [id number]     Arrival date & time 09/12/14  0533 History   First MD Initiated Contact with Patient 09/12/14 [phone]     Chief Complaint  Patient presents with  . Knee Pain     (Consider location/radiation/quality/duration/timing/severity/associated sxs/prior Treatment) HPI  Patient reports he has a history of gout that he normally gets it in his toes and in his left knee. He states normally when he has the gout in his left knee the the skin is warm to touch and red. He states he started getting pain in his left knee 4 days ago. He did not have any injury such as twisting his knee or stepping in hole. The following day his pain was gone. However the pain returned 2 days ago after he was he had worked for 10 hours. He states yesterday it felt better when he flexed his knee when he rode his motorcycle. However today he states flexing and extending the knee or any type of movement makes it hurt more as does weightbearing. He states nothing makes it feel better. He is not currently on any type of gout medication.  PCP Dr Ethlyn Gallery  Past Medical History  Diagnosis Date  . Essential hypertension, benign   . Hypercholesteremia   . Coronary artery disease     NSTEMI 05/2012 s/p PTCA/DES of the mid RCA, ramus intermediate, and mid left circumflex, LVEF 55-60%  . Arthritis   . Hyperglycemia   . NSTEMI (non-ST elevated myocardial infarction)     February 2014   Past Surgical History  Procedure Laterality Date  . Hernia repair    . Back surgery    . Neck surgery    . Left heart catheterization with coronary angiogram N/A 06/04/2012    Procedure: LEFT HEART CATHETERIZATION WITH CORONARY ANGIOGRAM;  Surgeon: Peter M Martinique, MD;  Location: [facility] CATH LAB;  Service: Cardiovascular;  Laterality: N/A;  . Percutaneous coronary stent intervention (pci-s)  06/04/2012    Procedure: PERCUTANEOUS CORONARY STENT INTERVENTION (PCI-S);  Surgeon: Peter M Martinique, MD;  Location: [facility] CATH LAB;  Service:  Cardiovascular;;   Family History  Problem Relation Age of Onset  . CAD Father     MI in his 35s   History  Substance Use Topics  . Smoking status: Former Smoker    Types: Cigarettes    Start date: 04/16/1980    Quit date: 04/16/2001  . Smokeless tobacco: Never Used  . 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     Allergies  Review of patient's allergies indicates no known allergies.  Home Medications   Prior to Admission medications   Medication Sig Start Date End Date Taking? Authorizing Provider  aspirin EC 81 MG EC tablet Take 1 tablet (81 mg total) by mouth daily. 06/05/12  Yes Dayna N Dunn, PA-C  atorvastatin (LIPITOR) 80 MG tablet TAKE ONE TABLET BY MOUTH ONCE DAILY 08/09/14  Yes Satira Sark, MD  clopidogrel (PLAVIX) 75 MG tablet TAKE ONE TABLET BY MOUTH ONCE DAILY 07/26/14  Yes Satira Sark, MD  HYDROcodone-acetaminophen (NORCO/VICODIN) 5-325 MG per tablet Take 1 tablet by mouth every 6 (six) hours as needed (pain).  05/25/14  Yes Historical Provider, MD  lisinopril (PRINIVIL,ZESTRIL) 5 MG tablet TAKE ONE TABLET BY MOUTH ONCE DAILY 07/19/14  Yes Satira Sark, MD  metoprolol tartrate (LOPRESSOR) 25 MG tablet Take 1 tablet (25 mg total) by mouth  2 (two) times daily. 05/10/14  Yes Satira Sark, MD  nitroGLYCERIN (NITROSTAT) 0.4 MG SL tablet Place 1 tablet (0.4 mg total) under the tongue every 5 (five) minutes as needed for chest pain (up to 3 doses). 06/05/12  Yes Dayna N Dunn, PA-C  colchicine 0.6 MG tablet Take 1 tablet (0.6 mg total) by mouth 2 (two) times daily. 09/12/14   Rolland Porter, MD  naproxen (NAPROSYN) 500 MG tablet Take 1 po BID with food prn pain 09/12/14   Rolland Porter, MD   BP 141/97 mmHg  Pulse 100  Temp(Src) 99.3 F (37.4 C) (Oral)  Resp 16  Ht 6\' 1"  (1.854 m)  Wt 229 lb (103.874 kg)  BMI 30.22 kg/m2  SpO2 97%  Vital signs normal   Physical Exam   Constitutional: He is oriented to person, place, and time. He appears well-developed and well-nourished.  Non-toxic appearance. He does not appear ill. No distress.  HENT:  Head: Normocephalic and atraumatic.  Right Ear: External ear normal.  Left Ear: External ear normal.  Nose: Nose normal. No mucosal edema or rhinorrhea.  Mouth/Throat: Mucous membranes are normal. No dental abscesses or uvula swelling.  Eyes: Conjunctivae and EOM are normal. Pupils are equal, round, and reactive to light.  Neck: Normal range of motion and full passive range of motion without pain. Neck supple.  Pulmonary/Chest: Effort normal. No respiratory distress. He has no rhonchi. He exhibits no crepitus.  Abdominal: Normal appearance.  Musculoskeletal: Normal range of motion. He exhibits edema and tenderness.  Patient noted to have large suprapatellar effusion and apparent effusion along the medial joint spaces. Is no redness or warmth to the skin. He is very tender to palpation over the proximal fibular head without deformity. He has some mild discomfort to palpation in the popliteal area of the knee.  Neurological: He is alert and oriented to person, place, and time. He has normal strength. No cranial nerve deficit.  Skin: Skin is warm, dry and intact. No rash noted. No erythema. No pallor.  Psychiatric: He has a normal mood and affect. His speech is normal and behavior is normal. His mood appears not anxious.  Nursing note and vitals reviewed.   ED Course  Procedures (including critical care time) Medications  ketorolac (TORADOL) injection 60 mg (60 mg Intramuscular Given 09/12/14 0648)    Recheck after Toradol. Patient states his pain is improving. He states he has had bursitis in his knee before but he cannot remember which one. He states he has seen Dr. Luna Glasgow, orthopedist in the past for his prior knee problems. He had Ace wrap placed on his left knee was placed in a knee immobilizer. He does not want  crutches and states he has a cane at home he can use.  BMET    Component Value Date/Time   NA 138 08/29/2012 1507   K 4.3 08/29/2012 1507   CL 103 08/29/2012 1507   CO2 26 08/29/2012 1507   GLUCOSE 96 08/29/2012 1507   BUN 21 08/29/2012 1507   CREATININE 1.17 08/29/2012 1507   CREATININE 1.17 06/05/2012 0640   CALCIUM 9.3 08/29/2012 1507   GFRNONAA 72* 06/05/2012 0640   GFRAA 83* 06/05/2012 0640   CBC    Component Value Date/Time   WBC 8.0 08/29/2012 1507   RBC 4.67 08/29/2012 1507   HGB 13.4 08/29/2012 1507   HCT 38.8* 08/29/2012 1507   PLT 225 08/29/2012 1507   MCV 83.1 08/29/2012 1507   MCH 28.7 08/29/2012 1507  MCHC 34.5 08/29/2012 1507   RDW 14.5 08/29/2012 1507   LYMPHSABS 2.7 06/03/2012 1308   MONOABS 0.6 06/03/2012 1308   EOSABS 0.2 06/03/2012 1308   BASOSABS 0.0 06/03/2012 1308       Labs Review Results for orders placed or performed during the hospital encounter of 09/12/14  Uric acid  Result Value Ref Range   Uric Acid, Serum 8.1 (H) 4.4 - 7.6 mg/dL   Laboratory interpretation all normal except elevated uric acid     Imaging Review Dg Knee Complete 4 Views Left  09/12/2014   CLINICAL DATA:  Knee pain and swelling for 5 days. History of gout. No injury.  EXAM: LEFT KNEE - COMPLETE 4+ VIEW  COMPARISON:  None.  FINDINGS: There is no evidence of fracture, dislocation, or joint effusion. There is no evidence of arthropathy or other focal bone abnormality. Large suprapatellar joint effusion without subcutaneous gas or radiopaque foreign bodies.  IMPRESSION: Large suprapatellar joint effusion.  No acute osseous process.   Electronically Signed   By: Elon Alas M.D.   On: 09/12/2014 06:54     EKG Interpretation None      MDM   Final diagnoses:  Bursitis, knee, left    New Prescriptions   COLCHICINE 0.6 MG TABLET    Take 1 tablet (0.6 mg total) by mouth 2 (two) times daily.   NAPROXEN (NAPROSYN) 500 MG TABLET    Take 1 po BID with food prn  pain    Plan discharge  Rolland Porter, MD, Barbette Or, MD 09/12/14 [phone]

## 2014-09-12 NOTE — ED Notes (Signed)
Pt made aware to return if symptoms worsen or if any life threatening symptoms occur.   

## 2014-09-12 NOTE — Discharge Instructions (Signed)
Use the ace wrap and knee immobilizer for comfort and to support your knee. You can try ice packs and heat also. Take the naproxen for pain. Call Dr Brooke Bonito office to have him recheck your knee this week.

## 2014-09-12 NOTE — ED Notes (Signed)
Left knee started hurting on Wednesday, was better on Friday, and then started bothering me again yesterday. Swollen and painful, no known injury per pt. History of gout.

## 2015-01-19 ENCOUNTER — Encounter: Payer: Self-pay | Admitting: Cardiology

## 2015-01-19 ENCOUNTER — Ambulatory Visit (INDEPENDENT_AMBULATORY_CARE_PROVIDER_SITE_OTHER): Payer: Managed Care, Other (non HMO) | Admitting: Cardiology

## 2015-01-19 VITALS — BP 130/88 | HR 65 | Ht 72.0 in | Wt 232.0 lb

## 2015-01-19 DIAGNOSIS — I10 Essential (primary) hypertension: Secondary | ICD-10-CM | POA: Diagnosis not present

## 2015-01-19 DIAGNOSIS — E782 Mixed hyperlipidemia: Secondary | ICD-10-CM

## 2015-01-19 DIAGNOSIS — I2510 Atherosclerotic heart disease of native coronary artery without angina pectoris: Secondary | ICD-10-CM

## 2015-01-19 MED ORDER — NITROGLYCERIN 0.4 MG SL SUBL: 0.4000 mg | SUBLINGUAL_TABLET | SUBLINGUAL | Status: DC | PRN

## 2015-01-19 NOTE — Patient Instructions (Signed)
Your physician wants you to follow-up in: 6 months with Dr. Domenic Polite. You will receive a reminder letter in the mail two months in advance. If you don't receive a letter, please call our office to schedule the follow-up appointment.  DECREASE LIPITOR to 40 mg daily  * Call and let us know how this dosage works for you so that we may update your chart*  Thanks for choosing [facility]!!!

## 2015-01-19 NOTE — Progress Notes (Signed)
Cardiology Office Note  Date: 01/19/2015   ID: Seth Graves, DOB 1960/05/01, MRN [id number]  PCP: Rocky Morel, MD  Primary Cardiologist: Rozann Lesches, MD   Chief Complaint  Patient presents with  . Coronary Artery Disease    History of Present Illness: Seth Graves is a 54 y.o. male last seen in April. He presents for a routine follow-up visit. He has had no angina symptoms with regular activities including vigorous exercise. He does tell me that he has had some generalized muscle soreness, more than expected. He ran out of Lipitor for a week and thinks that his symptoms may of gotten a little better during that time. We discussed reducing his dose to see if this is beneficial.  Medications were otherwise reviewed and outlined below. He reports no bleeding problems on DAPT. No nitroglycerin use. NSTEMI with DES to the circumflex, ramus, and RCA occurred back in February 2014. We discussed obtaining a follow-up stress test around the time of his next visit.  Follow-up ECG shows sinus bradycardia at 57 bpm.  Last lipid panel from earlier this year as documented below, at that time his LDL was 95 and his HDL was 33.   Past Medical History  Diagnosis Date  . Essential hypertension, benign   . Hypercholesteremia   . Coronary artery disease     NSTEMI 05/2012 s/p PTCA/DES of the mid RCA, ramus intermediate, and mid left circumflex, LVEF 55-60%  . Arthritis   . Hyperglycemia   . NSTEMI (non-ST elevated myocardial infarction) [facility])     February 2014    Past Surgical History  Procedure Laterality Date  . Hernia repair    . Back surgery    . Neck surgery    . Left heart catheterization with coronary angiogram N/A 06/04/2012    Procedure: LEFT HEART CATHETERIZATION WITH CORONARY ANGIOGRAM;  Surgeon: Peter M Martinique, MD;  Location: [facility] CATH LAB;  Service: Cardiovascular;  Laterality: N/A;  . Percutaneous coronary stent intervention (pci-s)  06/04/2012    Procedure:  PERCUTANEOUS CORONARY STENT INTERVENTION (PCI-S);  Surgeon: Peter M Martinique, MD;  Location: [facility] CATH LAB;  Service: Cardiovascular;;    Current Outpatient Prescriptions  Medication Sig Dispense Refill  . aspirin EC 81 MG EC tablet Take 1 tablet (81 mg total) by mouth daily.    Marland Kitchen atorvastatin (LIPITOR) 80 MG tablet TAKE ONE TABLET BY MOUTH ONCE DAILY 90 tablet 1  . clopidogrel (PLAVIX) 75 MG tablet TAKE ONE TABLET BY MOUTH ONCE DAILY 90 tablet 3  . HYDROcodone-acetaminophen (NORCO/VICODIN) 5-325 MG per tablet Take 1 tablet by mouth every 6 (six) hours as needed (pain).     Marland Kitchen lisinopril (PRINIVIL,ZESTRIL) 5 MG tablet TAKE ONE TABLET BY MOUTH ONCE DAILY 90 tablet 2  . metoprolol tartrate (LOPRESSOR) 25 MG tablet Take 1 tablet (25 mg total) by mouth 2 (two) times daily. 180 tablet 3  . nitroGLYCERIN (NITROSTAT) 0.4 MG SL tablet Place 1 tablet (0.4 mg total) under the tongue every 5 (five) minutes as needed for chest pain (up to 3 doses). 25 tablet 4   No current facility-administered medications for this visit.    Allergies:  Review of patient's allergies indicates no known allergies.   Social History: The patient  reports that he quit smoking about 13 years ago. His smoking use included Cigarettes. He started smoking about 34 years ago. He has never used smokeless tobacco. He reports that he does not drink alcohol or use illicit drugs.  ROS:  Please see the history of present illness. Otherwise, complete review of systems is positive for intermittent gout flares.  All other systems are reviewed and negative.   Physical Exam: VS:  BP 130/88 mmHg  Pulse 65  Ht 6' (1.829 m)  Wt 232 lb (105.235 kg)  BMI 31.46 kg/m2  SpO2 95%, BMI Body mass index is 31.46 kg/(m^2).  Wt Readings from Last 3 Encounters:  01/19/15 232 lb (105.235 kg)  09/12/14 229 lb (103.874 kg)  07/21/14 228 lb (103.42 kg)     Appears comfortable at rest.  HEENT: Conjunctiva and lids normal, oropharynx clear.  Neck:  Supple, no elevated JVP or carotid bruits, no thyromegaly.  Lungs: Clear to auscultation, nonlabored breathing at rest.  Cardiac: Regular rate and rhythm, no S3 or significant systolic murmur, no pericardial rub.  Abdomen: Soft, nontender, bowel sounds present.  Extremities: No pitting edema, distal pulses 2+.  Skin: Warm and dry.  Musculoskeletal: No kyphosis.  Neuropsychiatric: Alert and oriented x3, affect grossly appropriate.   ECG: ECG is ordered today.  Recent Labwork: 07/13/2014: ALT 22; AST 25     Component Value Date/Time   CHOL 164 07/13/2014 0853   TRIG 181* 07/13/2014 0853   HDL 33* 07/13/2014 0853   CHOLHDL 5.0 07/13/2014 0853   VLDL 36 07/13/2014 0853   LDLCALC 95 07/13/2014 0853    ASSESSMENT AND PLAN:  1. Symptomatically stable CAD status post NSTEMI with DES to the circumflex, ramus, and RCA back in February 2014. Plan is to continue medical therapy including DAPT. We will plan to obtain a follow-up Cardiolite around the time of his next visit in 6 months for assessment of ischemic burden.  2. Hyperlipidemia, on high-dose Lipitor. He has described some muscle achiness as outlined above, particularly with vigorous activity, more than he states would be expected. We will cut Lipitor down to 40 mg daily to see if this makes a positive impact. If so, follow-up lipid panel will be needed to make sure that he still has adequate lipid control.  3. Essential hypertension, blood pressure is reasonably well controlled today.  Current medicines were reviewed at length with the patient today.   Orders Placed This Encounter  Procedures  . EKG 12-Lead    Disposition: FU with me in 6 months.   Signed, Satira Sark, MD, [facility] 01/19/2015 9:25 AM    [facility] at [facility] [address] [place], [place] [address] Phone: [phone]; Fax: [phone]

## 2015-05-24 ENCOUNTER — Other Ambulatory Visit: Payer: Self-pay | Admitting: Cardiology

## 2015-06-26 ENCOUNTER — Other Ambulatory Visit: Payer: Self-pay | Admitting: Cardiology

## 2015-07-19 ENCOUNTER — Other Ambulatory Visit: Payer: Self-pay

## 2015-07-19 ENCOUNTER — Telehealth: Payer: Self-pay | Admitting: Cardiology

## 2015-07-19 NOTE — Telephone Encounter (Signed)
Pt would like to know if he needs to have any lab work done prior to his apt

## 2015-07-27 ENCOUNTER — Ambulatory Visit (INDEPENDENT_AMBULATORY_CARE_PROVIDER_SITE_OTHER): Payer: Managed Care, Other (non HMO) | Admitting: Cardiology

## 2015-07-27 ENCOUNTER — Encounter: Payer: Self-pay | Admitting: *Deleted

## 2015-07-27 ENCOUNTER — Encounter: Payer: Self-pay | Admitting: Cardiology

## 2015-07-27 VITALS — BP 122/76 | HR 65 | Ht 72.0 in | Wt 236.0 lb

## 2015-07-27 DIAGNOSIS — I25119 Atherosclerotic heart disease of native coronary artery with unspecified angina pectoris: Secondary | ICD-10-CM | POA: Diagnosis not present

## 2015-07-27 DIAGNOSIS — F17201 Nicotine dependence, unspecified, in remission: Secondary | ICD-10-CM

## 2015-07-27 DIAGNOSIS — I10 Essential (primary) hypertension: Secondary | ICD-10-CM | POA: Diagnosis not present

## 2015-07-27 DIAGNOSIS — E785 Hyperlipidemia, unspecified: Secondary | ICD-10-CM

## 2015-07-27 NOTE — Progress Notes (Signed)
Cardiology Office Note  Date: 07/27/2015   ID: Seth Graves, DOB [birth year], MRN [id number]  PCP: Rocky Morel, MD  Primary Cardiologist: Rozann Lesches, MD   Chief Complaint  Patient presents with  . Coronary Artery Disease    History of Present Illness: Seth Graves is a 55 y.o. male last seen in October 2016. He presents for a routine follow-up visit. Since last encounter he reports no increasing angina symptoms or nitroglycerin use. States that he has been under a lot of psychosocial stress both with his family and work. He is not exercising regularly.  I reviewed his medications which are outlined below. He stopped Lipitor altogether, has been off about a month. States that his muscle achiness has significantly improved. Otherwise he continues on aspirin, Plavix, lisinopril, and Lopressor.  We discussed obtaining a follow-up stress test, his intervention to the RCA, ramus, and circumflex was done back in February 2014. He is in agreement.  Past Medical History  Diagnosis Date  . Essential hypertension, benign   . Hypercholesteremia   . Coronary artery disease     NSTEMI 05/2012 s/p PTCA/DES of the mid RCA, ramus intermediate, and mid left circumflex, LVEF 55-60%  . Arthritis   . Hyperglycemia   . NSTEMI (non-ST elevated myocardial infarction) [facility])     February 2014    Past Surgical History  Procedure Laterality Date  . Hernia repair    . Back surgery    . Neck surgery    . Left heart catheterization with coronary angiogram N/A 06/04/2012    Procedure: LEFT HEART CATHETERIZATION WITH CORONARY ANGIOGRAM;  Surgeon: Peter M Martinique, MD;  Location: [facility] CATH LAB;  Service: Cardiovascular;  Laterality: N/A;  . Percutaneous coronary stent intervention (pci-s)  06/04/2012    Procedure: PERCUTANEOUS CORONARY STENT INTERVENTION (PCI-S);  Surgeon: Peter M Martinique, MD;  Location: [facility] CATH LAB;  Service: Cardiovascular;;    Current Outpatient Prescriptions  Medication Sig  Dispense Refill  . aspirin EC 81 MG EC tablet Take 1 tablet (81 mg total) by mouth daily.    . clopidogrel (PLAVIX) 75 MG tablet TAKE ONE TABLET BY MOUTH ONCE DAILY 90 tablet 3  . lisinopril (PRINIVIL,ZESTRIL) 5 MG tablet TAKE ONE TABLET BY MOUTH ONCE DAILY 90 tablet 3  . metoprolol tartrate (LOPRESSOR) 25 MG tablet Take 1 tablet (25 mg total) by mouth 2 (two) times daily. 180 tablet 3  . nitroGLYCERIN (NITROSTAT) 0.4 MG SL tablet Place 1 tablet (0.4 mg total) under the tongue every 5 (five) minutes as needed for chest pain (up to 3 doses). 25 tablet 4   No current facility-administered medications for this visit.   Allergies:  Atorvastatin   Social History: The patient  reports that he quit smoking about 14 years ago. His smoking use included Cigarettes. He started smoking about 35 years ago. He has never used smokeless tobacco. He reports that he does not drink alcohol or use illicit drugs.   ROS:  Please see the history of present illness. Otherwise, complete review of systems is positive for psychosocial stress.  All other systems are reviewed and negative.   Physical Exam: VS:  BP 122/76 mmHg  Pulse 65  Ht 6' (1.829 m)  Wt 236 lb (107.049 kg)  BMI 32.00 kg/m2  SpO2 96%, BMI Body mass index is 32 kg/(m^2).  Wt Readings from Last 3 Encounters:  07/27/15 236 lb (107.049 kg)  01/19/15 232 lb (105.235 kg)  09/12/14 229 lb (103.874 kg)  Appears comfortable at rest.  HEENT: Conjunctiva and lids normal, oropharynx clear.  Neck: Supple, no elevated JVP or carotid bruits, no thyromegaly.  Lungs: Clear to auscultation, nonlabored breathing at rest.  Cardiac: Regular rate and rhythm, no S3 or significant systolic murmur, no pericardial rub.  Abdomen: Soft, nontender, bowel sounds present.  Extremities: No pitting edema, distal pulses 2+.  Skin: Warm and dry.  Musculoskeletal: No kyphosis.  Neuropsychiatric: Alert and oriented x3, affect grossly appropriate.  ECG: I  personally reviewed the prior tracing from 01/19/2015 which showed sinus bradycardia.  Recent Labwork:    Component Value Date/Time   CHOL 164 07/13/2014 0853   TRIG 181* 07/13/2014 0853   HDL 33* 07/13/2014 0853   CHOLHDL 5.0 07/13/2014 0853   VLDL 36 07/13/2014 0853   LDLCALC 95 07/13/2014 0853    Other Studies Reviewed Today:  Cardiac catheterization and intervention 06/04/2012: Coronary dominance: right  Left mainstem: Large and normal  Left anterior descending (LAD): The left anterior descending artery is a large vessel extends around the apex. There is a long 30% stenosis in the mid vessel. The first diagonal is without significant disease.  Ramus intermediate: This is a large branch which has a 90-95% stenosis in the proximal vessel.  Left circumflex (LCx): There is a long segmental stenosis in the mid vessel up to 70%.  Right coronary artery (RCA): The right coronary is a very large dominant vessel. It has mild disease in the proximal vessel up to 20-30%. In the mid vessel there is a focal stenosis of 90-95% at an acute angulated bend. There is ectasia following this lesion and then a segment of 30% disease distal.  Left ventriculography: Left ventricular systolic function is normal, LVEF is estimated at 55-60%, there is mild mid anterior hypokinesis, there is no significant mitral regurgitation   PCI Note: Following the diagnostic procedure, the decision was made to proceed with PCI. The radial sheath was upsized to a 6 [place]. Brilinta 180 mg was given orally. Weight-based bivalirudin was given for anticoagulation. Once a therapeutic ACT was achieved, a 6 [place] XB RCA guide catheter was inserted. A pro-water coronary guidewire was used to cross the lesion in the mid RCA. The lesion was predilated with a 2.5 mm balloon. The lesion was then stented with a 4.0 x 28 mm Promus premier stent. The stent was postdilated with a 4.5 mm noncompliant balloon. Following PCI, there  was 0% residual stenosis and TIMI-3 flow. Final angiography confirmed an excellent result.  We then proceeded with intervention of the ramus intermediate branch. We switched to a XB LAD 3.5 guide. A pro-water coronary guidewire was used to cross the lesion. The lesion was predilated with a 2.5 mm balloon. The lesion was then stented with a 3.0 x 20 mm Promus premier stent the stent was postdilated with a 3.0 mm noncompliant balloon. Following PCI, there was 0% residual stenosis and TIMI grade 3 flow. Final angiography confirmed an excellent result.  We next proceeded with intervention of the mid left circumflex. This lesion was crossed with the pro-water. It was predilated using a 2.5 mm balloon. It was then stented with a 3.0 x 20 mm Promus premier stent. The stent was postdilated with a 3.0 mm noncompliant balloon. Following PCI, there was 0% residual stenosis and TIMI grade 3 flow. Final angiography confirmed an excellent result. The patient tolerated the procedure well. There were no immediate procedural complications. A TR band was used for radial hemostasis. The patient was transferred to  the post catheterization recovery area for further monitoring.  PCI Data: Vessel #1 - RCA/Segment - mid Percent Stenosis (pre) 90-95% TIMI-flow 3 Stent 4.0 x 28 mm Promus premier Percent Stenosis (post) 0% TIMI-flow (post) 3  Vessel #2-ramus intermediate/proximal Percent stenoses: 90-95% TIMI flow 3 Stent: 3.0 x 20 mm Promus premier Percent stenoses (post): 0% TIMI flow: 3  Vessel #3-left circumflex/mid Percent stenoses: 70% TIMI flow 3 Stent: 3.0 x 20 mm Promus premier Percent stenoses (post) 0% TIMI flow: 3  Final Conclusions:  1. Three-vessel obstructive coronary disease. 2. Good left ventricular function. 3. Successful stenting of the mid RCA, proximal ramus intermediate, and mid left circumflex with drug-eluting stents.   Assessment and Plan:  1. Multivessel CAD status post DES to  the RCA, ramus, and circumflex back in 2014. He is due for follow-up ischemic evaluation. Plan is to proceed with a Lexiscan Cardiolite on medical therapy to reassess ischemic burden. Otherwise I asked him to consider a regular exercise plan, at least a walking regimen. He has been under a lot of stress with family and work however.  2. Hyperlipidemia with intolerance to Lipitor. Muscle achiness has improved significantly off Lipitor. We will recheck lipid panel and go from there. There are several other options to consider.  3. Essential hypertension, blood pressure is well controlled today. No changes made.  4. Tobacco abuse in remission. No cravings or relapses.  Current medicines were reviewed with the patient today.   Orders Placed This Encounter  Procedures  . NM Myocar Multi W/Spect W/Wall Motion / EF  . Lipid Profile    Disposition: FU with me in 6 months.   Signed, Satira Sark, MD, [facility] 07/27/2015 9:25 AM    [facility] [address], [place], [place] [address] Phone: [phone]; Fax: [phone]

## 2015-07-27 NOTE — Patient Instructions (Signed)
Your physician wants you to follow-up in: 6 Months with Dr. Domenic Polite. You will receive a reminder letter in the mail two months in advance. If you don't receive a letter, please call our office to schedule the follow-up appointment.  Your physician recommends that you continue on your current medications as directed. Please refer to the Current Medication list given to you today.  Your physician has requested that you have a lexiscan myoview. For further information please visit [email or web address]. Please follow instruction sheet, as given.  Your physician recommends that you return for lab work in: Fasting ( Nothing to eat or drink after midnight)  If you need a refill on your cardiac medications before your next appointment, please call your pharmacy.  Thank you for choosing [facility]!

## 2015-08-05 ENCOUNTER — Encounter (HOSPITAL_COMMUNITY)
Admission: RE | Admit: 2015-08-05 | Discharge: 2015-08-05 | Disposition: A | Discharge status: Home / Self Care | Payer: Managed Care, Other (non HMO) | Source: Ambulatory Visit | Attending: Cardiology | Admitting: Cardiology

## 2015-08-05 ENCOUNTER — Encounter (HOSPITAL_COMMUNITY): Payer: Self-pay

## 2015-08-05 ENCOUNTER — Inpatient Hospital Stay (HOSPITAL_COMMUNITY): Admission: RE | Admit: 2015-08-05 | Payer: Managed Care, Other (non HMO) | Source: Ambulatory Visit

## 2015-08-05 DIAGNOSIS — I25119 Atherosclerotic heart disease of native coronary artery with unspecified angina pectoris: Secondary | ICD-10-CM | POA: Insufficient documentation

## 2015-08-05 LAB — NM MYOCAR MULTI W/SPECT W/WALL MOTION / EF
CHL CUP NUCLEAR SDS: 0
CHL CUP NUCLEAR SSS: 1
LV dias vol: 123 mL (ref 62–150)
LVSYSVOL: 49 mL
Peak HR: 103 {beats}/min
RATE: 0.31
Rest HR: 70 {beats}/min
SRS: 1
TID: 1.2

## 2015-08-05 MED ORDER — SODIUM CHLORIDE 0.9% FLUSH
INTRAVENOUS | Status: AC
  Administered 2015-08-05: 10 mL via INTRAVENOUS
  Filled 2015-08-05: qty 10

## 2015-08-05 MED ORDER — TECHNETIUM TC 99M SESTAMIBI GENERIC - CARDIOLITE
10.0000 | Freq: Once | INTRAVENOUS | Status: AC | PRN
  Administered 2015-08-05: 10 via INTRAVENOUS

## 2015-08-05 MED ORDER — TECHNETIUM TC 99M SESTAMIBI - CARDIOLITE
30.0000 | Freq: Once | INTRAVENOUS | Status: AC | PRN
  Administered 2015-08-05: 10:00:00 30 via INTRAVENOUS

## 2015-08-05 MED ORDER — REGADENOSON 0.4 MG/5ML IV SOLN
INTRAVENOUS | Status: AC
  Administered 2015-08-05: 0.4 mg via INTRAVENOUS
  Filled 2015-08-05: qty 5

## 2015-08-30 ENCOUNTER — Other Ambulatory Visit: Payer: Self-pay | Admitting: Cardiology

## 2015-09-03 LAB — LIPID PANEL
CHOLESTEROL: 212 mg/dL — AB (ref 125–200)
HDL: 43 mg/dL (ref >=40)
LDL Cholesterol: 143 mg/dL — ABNORMAL HIGH (ref <130)
TRIGLYCERIDES: 131 mg/dL (ref <150)
Total CHOL/HDL Ratio: 4.9 Ratio (ref <=5.0)
VLDL: 26 mg/dL (ref <30)

## 2015-09-13 ENCOUNTER — Telehealth: Payer: Self-pay

## 2015-09-13 MED ORDER — ROSUVASTATIN CALCIUM 5 MG PO TABS: 5.0000 mg | ORAL_TABLET | Freq: Every day | ORAL | Status: DC

## 2015-09-13 NOTE — Telephone Encounter (Signed)
Lab results given,pt agrees to try Crestor 5 mg daily,e-cribed to [facility]

## 2015-09-13 NOTE — Telephone Encounter (Signed)
-----   Message from Satira Sark, MD sent at 09/05/2015  7:43 AM EDT ----- Reviewed. Total cholesterol has gone up to 212 from 164 and LDL has gone up to 143 from 95. Lipitor was stopped due to side effects. We could try Crestor at 5 mg daily.

## 2015-12-02 ENCOUNTER — Encounter (HOSPITAL_COMMUNITY): Payer: Self-pay | Admitting: Emergency Medicine

## 2015-12-02 ENCOUNTER — Emergency Department (HOSPITAL_COMMUNITY)
Admission: EM | Admit: 2015-12-02 | Discharge: 2015-12-02 | Disposition: A | Discharge status: Home / Self Care | Payer: Managed Care, Other (non HMO) | Attending: Emergency Medicine | Admitting: Emergency Medicine

## 2015-12-02 DIAGNOSIS — Z7982 Long term (current) use of aspirin: Secondary | ICD-10-CM | POA: Insufficient documentation

## 2015-12-02 DIAGNOSIS — Y999 Unspecified external cause status: Secondary | ICD-10-CM | POA: Diagnosis not present

## 2015-12-02 DIAGNOSIS — S76312A Strain of muscle, fascia and tendon of the posterior muscle group at thigh level, left thigh, initial encounter: Secondary | ICD-10-CM | POA: Diagnosis not present

## 2015-12-02 DIAGNOSIS — I10 Essential (primary) hypertension: Secondary | ICD-10-CM | POA: Diagnosis not present

## 2015-12-02 DIAGNOSIS — Z79899 Other long term (current) drug therapy: Secondary | ICD-10-CM | POA: Diagnosis not present

## 2015-12-02 DIAGNOSIS — I2510 Atherosclerotic heart disease of native coronary artery without angina pectoris: Secondary | ICD-10-CM | POA: Insufficient documentation

## 2015-12-02 DIAGNOSIS — X58XXXA Exposure to other specified factors, initial encounter: Secondary | ICD-10-CM | POA: Insufficient documentation

## 2015-12-02 DIAGNOSIS — Y9302 Activity, running: Secondary | ICD-10-CM | POA: Diagnosis not present

## 2015-12-02 DIAGNOSIS — Y929 Unspecified place or not applicable: Secondary | ICD-10-CM | POA: Insufficient documentation

## 2015-12-02 DIAGNOSIS — Z87891 Personal history of nicotine dependence: Secondary | ICD-10-CM | POA: Insufficient documentation

## 2015-12-02 DIAGNOSIS — S8992XA Unspecified injury of left lower leg, initial encounter: Secondary | ICD-10-CM | POA: Diagnosis present

## 2015-12-02 NOTE — ED Provider Notes (Signed)
[place] DEPT Provider Note   CSN: [id number] Arrival date & time: 12/02/15  1225     History   Chief Complaint Chief Complaint  Patient presents with  . Leg Injury    HPI Seth Graves is a 55 y.o. male.  Patient is a 55 year old male who presents to the emergency department with a complaint of left leg pain.  The patient states that 5 or 6 days ago he was playing softball. He was running, when he felt a sharp pain in the back of his thigh area. He had some pain then but the pain continued to increase as the day went by. He eventually had to apply an Ace wrap for added support and improvement of his pain. The pain is increased when he bends over or walks in a certain way. The pain is slightly improved when he is wearing an Ace bandage to his thigh. It is of note that the patient is on Plavix.   Patient is currently on FMLA leave. He was hoping that he can get an MRI of his thigh and get information on when he could safely return to work on. He presents at this time for evaluation of his hamstring issue.      Past Medical History:  Diagnosis Date  . Arthritis   . Coronary artery disease    NSTEMI 05/2012 s/p PTCA/DES of the mid RCA, ramus intermediate, and mid left circumflex, LVEF 55-60%  . Essential hypertension, benign   . Hypercholesteremia   . Hyperglycemia   . NSTEMI (non-ST elevated myocardial infarction) [facility])    February 2014    Patient Active Problem List   Diagnosis Date Noted  . Hyperlipidemia 06/11/2012  . Essential hypertension, benign 06/11/2012  . Hyperglycemia 06/11/2012  . Gout 06/11/2012  . Coronary atherosclerosis of native coronary artery 06/03/2012    Past Surgical History:  Procedure Laterality Date  . BACK SURGERY    . HERNIA REPAIR    . LEFT HEART CATHETERIZATION WITH CORONARY ANGIOGRAM N/A 06/04/2012   Procedure: LEFT HEART CATHETERIZATION WITH CORONARY ANGIOGRAM;  Surgeon: Peter M Martinique, MD;  Location: [facility] CATH LAB;  Service:  Cardiovascular;  Laterality: N/A;  . NECK SURGERY    . PERCUTANEOUS CORONARY STENT INTERVENTION (PCI-S)  06/04/2012   Procedure: PERCUTANEOUS CORONARY STENT INTERVENTION (PCI-S);  Surgeon: Peter M Martinique, MD;  Location: [facility] CATH LAB;  Service: Cardiovascular;;       Home Medications    Prior to Admission medications   Medication Sig Start Date End Date Taking? Authorizing Provider  aspirin EC 81 MG EC tablet Take 1 tablet (81 mg total) by mouth daily. 06/05/12   Dayna N Dunn, PA-C  clopidogrel (PLAVIX) 75 MG tablet TAKE ONE TABLET BY MOUTH ONCE DAILY 08/30/15   Satira Sark, MD  lisinopril (PRINIVIL,ZESTRIL) 5 MG tablet TAKE ONE TABLET BY MOUTH ONCE DAILY 05/24/15   Satira Sark, MD  metoprolol tartrate (LOPRESSOR) 25 MG tablet Take 1 tablet (25 mg total) by mouth 2 (two) times daily. 05/10/14   Satira Sark, MD  nitroGLYCERIN (NITROSTAT) 0.4 MG SL tablet Place 1 tablet (0.4 mg total) under the tongue every 5 (five) minutes as needed for chest pain (up to 3 doses). 01/19/15   Satira Sark, MD  rosuvastatin (CRESTOR) 5 MG tablet Take 1 tablet (5 mg total) by mouth daily. 09/13/15   Satira Sark, MD    Family History Family History  Problem Relation Age of Onset  . CAD Father  MI in his 41s    Social History Social History  Substance Use Topics  . Smoking status: Former Smoker    Types: Cigarettes    Start date: 04/16/1980    Quit date: 04/16/2001  . Smokeless tobacco: Never Used  . Alcohol use No     Allergies   Atorvastatin   Review of Systems Review of Systems  Musculoskeletal: Positive for arthralgias.  All other systems reviewed and are negative.    Physical Exam Updated Vital Signs BP 133/87 (BP Location: Left Arm)   Pulse 75   Temp 99 F (37.2 C) (Temporal)   Resp 18   Ht 6' (1.829 m)   Wt 107 kg   SpO2 97%   BMI 32.01 kg/m   Physical Exam  Constitutional: He is oriented to person, place, and time. He appears well-developed and  well-nourished.  Non-toxic appearance.  HENT:  Head: Normocephalic.  Right Ear: Tympanic membrane and external ear normal.  Left Ear: Tympanic membrane and external ear normal.  Eyes: EOM and lids are normal. Pupils are equal, round, and reactive to light.  Neck: Normal range of motion. Neck supple. Carotid bruit is not present.  Cardiovascular: Normal rate, regular rhythm, normal heart sounds, intact distal pulses and normal pulses.   Pulmonary/Chest: Breath sounds normal. No respiratory distress.  Abdominal: Soft. Bowel sounds are normal. There is no tenderness. There is no guarding.  Musculoskeletal: He exhibits tenderness.  The left dorsalis pedis pulses 2+. The left Achilles tendon is intact. There is good range of motion of the left knee. There is bruising behind the knee extending into the thigh area. There is tenderness to palpation in the mid thigh area on, and there is a moderate hematoma present at the medial mid thigh area. There is no effusion of the knee. There is good range of motion of the left hip.  Lymphadenopathy:       Head (right side): No submandibular adenopathy present.       Head (left side): No submandibular adenopathy present.    He has no cervical adenopathy.  Neurological: He is alert and oriented to person, place, and time. He has normal strength. No cranial nerve deficit or sensory deficit.  Skin: Skin is warm and dry.  Psychiatric: He has a normal mood and affect. His speech is normal.  Nursing note and vitals reviewed.    ED Treatments / Results  Labs (all labs ordered are listed, but only abnormal results are displayed) Labs Reviewed - No data to display  EKG  EKG Interpretation None       Radiology No results found.  Procedures Procedures (including critical care time)  Medications Ordered in ED Medications - No data to display   Initial Impression / Assessment and Plan / ED Course  I have reviewed the triage vital signs and the nursing  notes.  Pertinent labs & imaging results that were available during my care of the patient were reviewed by me and considered in my medical decision making (see chart for details).  Clinical Course    *I have reviewed nursing notes, vital signs, and all appropriate lab and imaging results for this patient.**  Final Clinical Impressions(s) / ED Diagnoses  I discussed with the patient the need to use an Ace bandage on. To apply ice to the left thigh. And crutches were given so that he can take some of the pressure off of his left leg. The patient is on Plavix on. And I feel that  this probably has a lot to do with some of the bruising that he has on the back of his knee and back of his thigh. There is no effusion of the knee at this time. I've encouraged patient to see his primary physician who can order him the MRI that he is looking for to better assess the muscles of his thigh. We also discussed the possibility of orthopedic evaluation on. The patient acknowledges understanding of the instruction and he will see his doctor next week. Patient will return to the emergency department if any emergent changes, problems, or concerns.    Final diagnoses:  Hamstring strain, left, initial encounter    New Prescriptions New Prescriptions   No medications on file     Lily Kocher, PA-C 12/02/15 1412    Dorie Rank, MD 12/02/15 1517

## 2015-12-02 NOTE — ED Triage Notes (Signed)
Pt states he was playing softball a week ago and thinks he tore his left hamstring.

## 2015-12-02 NOTE — Discharge Instructions (Signed)
Please use crutches to decrease the amount of weight Place on your left leg. Please keep your leg elevated above your waist. Apply ice. Continue to use your Ace bandage until seen by your primary physician. Please see your primary physician as sone as possible for evaluation, and possible MRI studies.

## 2015-12-08 ENCOUNTER — Other Ambulatory Visit (HOSPITAL_COMMUNITY): Payer: Self-pay | Admitting: Registered Nurse

## 2016-04-26 NOTE — Progress Notes (Signed)
Cardiology Office Note  Date: 04/27/2016   ID: Seth Graves, DOB 1960/06/12, MRN [id number]  PCP: [facility]  Primary Cardiologist: Rozann Lesches, MD   Chief Complaint  Patient presents with  . Coronary Artery Disease    History of Present Illness: Seth Graves is a 56 y.o. male last seen in April 2017. He presents for a routine follow-up visit. Reports no progressive angina symptoms or functional limitations. He did pull a hamstring playing softball since I saw him, but is recovering and back to exercising with a bicycle.  We initiated Crestor after rechecking his lipids in May of last year. He had had prior intolerance to Lipitor. He states that he has been able to take the Crestor without any obvious side effects. We discussed obtaining a follow-up liver and lipid panel.  Follow-up Myoview from April 2017 is outlined below. I reviewed his ECG today which shows sinus bradycardia.  Past Medical History:  Diagnosis Date  . Arthritis   . Coronary artery disease    NSTEMI 05/2012 s/p PTCA/DES of the mid RCA, ramus intermediate, and mid left circumflex, LVEF 55-60%  . Essential hypertension, benign   . Hypercholesteremia   . Hyperglycemia   . NSTEMI (non-ST elevated myocardial infarction) [facility])    February 2014    Past Surgical History:  Procedure Laterality Date  . BACK SURGERY    . HERNIA REPAIR    . LEFT HEART CATHETERIZATION WITH CORONARY ANGIOGRAM N/A 06/04/2012   Procedure: LEFT HEART CATHETERIZATION WITH CORONARY ANGIOGRAM;  Surgeon: Peter M Martinique, MD;  Location: [facility] CATH LAB;  Service: Cardiovascular;  Laterality: N/A;  . NECK SURGERY    . PERCUTANEOUS CORONARY STENT INTERVENTION (PCI-S)  06/04/2012   Procedure: PERCUTANEOUS CORONARY STENT INTERVENTION (PCI-S);  Surgeon: Peter M Martinique, MD;  Location: [facility] CATH LAB;  Service: Cardiovascular;;    Current Outpatient Prescriptions  Medication Sig Dispense Refill  . aspirin EC 81 MG EC tablet Take 1  tablet (81 mg total) by mouth daily.    . clopidogrel (PLAVIX) 75 MG tablet TAKE ONE TABLET BY MOUTH ONCE DAILY 90 tablet 3  . lisinopril (PRINIVIL,ZESTRIL) 5 MG tablet TAKE ONE TABLET BY MOUTH ONCE DAILY 90 tablet 3  . metoprolol tartrate (LOPRESSOR) 25 MG tablet Take 1 tablet (25 mg total) by mouth 2 (two) times daily. 180 tablet 3  . nitroGLYCERIN (NITROSTAT) 0.4 MG SL tablet Place 1 tablet (0.4 mg total) under the tongue every 5 (five) minutes as needed for chest pain (up to 3 doses). 25 tablet 4  . rosuvastatin (CRESTOR) 5 MG tablet Take 1 tablet (5 mg total) by mouth daily. 90 tablet 3   No current facility-administered medications for this visit.    Allergies:  Atorvastatin   Social History: The patient  reports that he quit smoking about 15 years ago. His smoking use included Cigarettes. He started smoking about 36 years ago. He has never used smokeless tobacco. He reports that he does not drink alcohol or use drugs.   ROS:  Please see the history of present illness. Otherwise, complete review of systems is positive for none.  All other systems are reviewed and negative.   Physical Exam: VS:  BP 138/84   Pulse 69   Ht 6' (1.829 m)   Wt 239 lb (108.4 kg)   SpO2 96%   BMI 32.41 kg/m , BMI Body mass index is 32.41 kg/m.  Wt Readings from Last 3 Encounters:  04/27/16 239 lb (  108.4 kg)  12/02/15 236 lb (107 kg)  07/27/15 236 lb (107 kg)    Appears comfortable at rest.  HEENT: Conjunctiva and lids normal, oropharynx clear.  Neck: Supple, no elevated JVP or carotid bruits, no thyromegaly.  Lungs: Clear to auscultation, nonlabored breathing at rest.  Cardiac: Regular rate and rhythm, no S3 or significant systolic murmur, no pericardial rub.  Abdomen: Soft, nontender, bowel sounds present.  Extremities: No pitting edema, distal pulses 2+.  Skin: Warm and dry.  Musculoskeletal: No kyphosis.  Neuropsychiatric: Alert and oriented x3, affect grossly appropriate.  ECG: I  personally reviewed the tracing from 01/19/2015 which showed sinus bradycardia.  Recent Labwork:    Component Value Date/Time   CHOL 212 (H) 09/02/2015 0847   TRIG 131 09/02/2015 0847   HDL 43 09/02/2015 0847   CHOLHDL 4.9 09/02/2015 0847   VLDL 26 09/02/2015 0847   LDLCALC 143 (H) 09/02/2015 0847    Other Studies Reviewed Today:  Cardiac catheterization and intervention 06/04/2012: Coronary dominance: right  Left mainstem: Large and normal  Left anterior descending (LAD): The left anterior descending artery is a large vessel extends around the apex. There is a long 30% stenosis in the mid vessel. The first diagonal is without significant disease.  Ramus intermediate: This is a large branch which has a 90-95% stenosis in the proximal vessel.  Left circumflex (LCx): There is a long segmental stenosis in the mid vessel up to 70%.  Right coronary artery (RCA): The right coronary is a very large dominant vessel. It has mild disease in the proximal vessel up to 20-30%. In the mid vessel there is a focal stenosis of 90-95% at an acute angulated bend. There is ectasia following this lesion and then a segment of 30% disease distal.  Left ventriculography: Left ventricular systolic function is normal, LVEF is estimated at 55-60%, there is mild mid anterior hypokinesis, there is no significant mitral regurgitation   PCI Note: Following the diagnostic procedure, the decision was made to proceed with PCI. The radial sheath was upsized to a 6 [place]. Brilinta 180 mg was given orally. Weight-based bivalirudin was given for anticoagulation. Once a therapeutic ACT was achieved, a 6 [place] XB RCA guide catheter was inserted. A pro-water coronary guidewire was used to cross the lesion in the mid RCA. The lesion was predilated with a 2.5 mm balloon. The lesion was then stented with a 4.0 x 28 mm Promus premier stent. The stent was postdilated with a 4.5 mm noncompliant balloon. Following PCI,  there was 0% residual stenosis and TIMI-3 flow. Final angiography confirmed an excellent result.  We then proceeded with intervention of the ramus intermediate branch. We switched to a XB LAD 3.5 guide. A pro-water coronary guidewire was used to cross the lesion. The lesion was predilated with a 2.5 mm balloon. The lesion was then stented with a 3.0 x 20 mm Promus premier stent the stent was postdilated with a 3.0 mm noncompliant balloon. Following PCI, there was 0% residual stenosis and TIMI grade 3 flow. Final angiography confirmed an excellent result.  We next proceeded with intervention of the mid left circumflex. This lesion was crossed with the pro-water. It was predilated using a 2.5 mm balloon. It was then stented with a 3.0 x 20 mm Promus premier stent. The stent was postdilated with a 3.0 mm noncompliant balloon. Following PCI, there was 0% residual stenosis and TIMI grade 3 flow. Final angiography confirmed an excellent result. The patient tolerated the procedure well. There  were no immediate procedural complications. A TR band was used for radial hemostasis. The patient was transferred to the post catheterization recovery area for further monitoring.  PCI Data: Vessel #1 - RCA/Segment - mid Percent Stenosis (pre) 90-95% TIMI-flow 3 Stent 4.0 x 28 mm Promus premier Percent Stenosis (post) 0% TIMI-flow (post) 3  Vessel #2-ramus intermediate/proximal Percent stenoses: 90-95% TIMI flow 3 Stent: 3.0 x 20 mm Promus premier Percent stenoses (post): 0% TIMI flow: 3  Vessel #3-left circumflex/mid Percent stenoses: 70% TIMI flow 3 Stent: 3.0 x 20 mm Promus premier Percent stenoses (post) 0% TIMI flow: 3  Final Conclusions:  1. Three-vessel obstructive coronary disease. 2. Good left ventricular function. 3. Successful stenting of the mid RCA, proximal ramus intermediate, and mid left circumflex with drug-eluting stents.   Lexiscan Myoview 08/05/2015:  There was no ST  segment deviation noted during stress.  Findings consistent with prior mild basal inferoseptal myocardial infarction with very mild peri-infarct ischemia.  This is a low risk study.  The left ventricular ejection fraction is normal (55-65%).  Assessment and Plan:  1. CAD status post DES to the RCA, ramus, and LAD in 2014 as outlined above. Follow-up stress testing from last year was overall low risk. Plan to continue medical therapy and observation.  2. Hyperlipidemia with intolerance to Lipitor. He states that he has been able to take Crestor so far. Follow-up FLP and LFTs.  3. Essential hypertension, no changes made to current regimen. He continues on Lopressor and lisinopril.  4. Tobacco abuse in remission.  Current medicines were reviewed with the patient today.   Orders Placed This Encounter  Procedures  . Lipid Profile  . Hepatic function panel  . EKG 12-Lead    Disposition: Follow-up in 6 months.  Signed, Satira Sark, MD, [facility] 04/27/2016 9:41 AM    [facility] [place]. [address], [place], [place] [address] Phone: [phone]; Fax: [phone]

## 2016-04-27 ENCOUNTER — Encounter: Payer: Self-pay | Admitting: Cardiology

## 2016-04-27 ENCOUNTER — Ambulatory Visit (INDEPENDENT_AMBULATORY_CARE_PROVIDER_SITE_OTHER): Payer: Managed Care, Other (non HMO) | Admitting: Cardiology

## 2016-04-27 ENCOUNTER — Telehealth: Payer: Self-pay

## 2016-04-27 VITALS — BP 138/84 | HR 69 | Ht 72.0 in | Wt 239.0 lb

## 2016-04-27 DIAGNOSIS — F17201 Nicotine dependence, unspecified, in remission: Secondary | ICD-10-CM | POA: Diagnosis not present

## 2016-04-27 DIAGNOSIS — I2510 Atherosclerotic heart disease of native coronary artery without angina pectoris: Secondary | ICD-10-CM | POA: Diagnosis not present

## 2016-04-27 DIAGNOSIS — E782 Mixed hyperlipidemia: Secondary | ICD-10-CM | POA: Diagnosis not present

## 2016-04-27 DIAGNOSIS — I10 Essential (primary) hypertension: Secondary | ICD-10-CM

## 2016-04-27 LAB — LIPID PANEL
Cholesterol: 186 mg/dL (ref <200)
HDL: 48 mg/dL (ref >40)
LDL Cholesterol: 107 mg/dL — ABNORMAL HIGH (ref <100)
Total CHOL/HDL Ratio: 3.9 Ratio (ref <5.0)
Triglycerides: 154 mg/dL — ABNORMAL HIGH (ref <150)
VLDL: 31 mg/dL — ABNORMAL HIGH (ref <30)

## 2016-04-27 LAB — HEPATIC FUNCTION PANEL
ALT: 17 U/L (ref 9–46)
AST: 18 U/L (ref 10–35)
Albumin: 4.2 g/dL (ref 3.6–5.1)
Alkaline Phosphatase: 44 U/L (ref 40–115)
BILIRUBIN DIRECT: 0.1 mg/dL (ref <=0.2)
BILIRUBIN INDIRECT: 0.5 mg/dL (ref 0.2–1.2)
Total Bilirubin: 0.6 mg/dL (ref 0.2–1.2)
Total Protein: 6.7 g/dL (ref 6.1–8.1)

## 2016-04-27 MED ORDER — ROSUVASTATIN CALCIUM 10 MG PO TABS: 10.0000 mg | ORAL_TABLET | Freq: Every day | ORAL | 3 refills | Status: DC

## 2016-04-27 NOTE — Telephone Encounter (Signed)
Pt agreed to increase crestor to 10 mg,escribed to pharmacy

## 2016-04-27 NOTE — Telephone Encounter (Signed)
-----   Message from Satira Sark, MD sent at 04/27/2016  3:36 PM EST ----- Results reviewed. Numbers have improved somewhat since being on Crestor with total cholesterol coming down to 186 and LDL down to 107. Might consider going up to Crestor 10 mg daily to get more optimal control in the setting of CAD. A copy of this test should be forwarded to [facility].

## 2016-04-27 NOTE — Patient Instructions (Signed)
Your physician wants you to follow-up in: 6 months Dr Ferne Reus will receive a reminder letter in the mail two months in advance. If you don't receive a letter, please call our office to schedule the follow-up appointment.    Your physician recommends that you return for lab work in: FASTING lipid and LFT's    Your physician recommends that you continue on your current medications as directed. Please refer to the Current Medication list given to you today.    If you need a refill on your cardiac medications before your next appointment, please call your pharmacy.    Thank you for choosing [facility] !

## 2016-07-01 ENCOUNTER — Other Ambulatory Visit: Payer: Self-pay | Admitting: Cardiology

## 2016-07-02 ENCOUNTER — Other Ambulatory Visit: Payer: Self-pay

## 2016-07-02 MED ORDER — LISINOPRIL 5 MG PO TABS: 5.0000 mg | ORAL_TABLET | Freq: Every day | ORAL | 1 refills | Status: DC

## 2016-07-02 NOTE — Telephone Encounter (Signed)
Refill to [facility] as requetsed

## 2016-10-10 ENCOUNTER — Other Ambulatory Visit: Payer: Self-pay | Admitting: Cardiology

## 2016-10-14 ENCOUNTER — Other Ambulatory Visit: Payer: Self-pay | Admitting: Cardiology

## 2017-02-17 ENCOUNTER — Other Ambulatory Visit: Payer: Self-pay | Admitting: Cardiology

## 2017-02-21 ENCOUNTER — Emergency Department (HOSPITAL_COMMUNITY)
Admission: EM | Admit: 2017-02-21 | Discharge: 2017-02-21 | Disposition: A | Discharge status: Home / Self Care | Payer: Managed Care, Other (non HMO) | Attending: Emergency Medicine | Admitting: Emergency Medicine

## 2017-02-21 ENCOUNTER — Other Ambulatory Visit: Payer: Self-pay

## 2017-02-21 ENCOUNTER — Encounter (HOSPITAL_COMMUNITY): Payer: Self-pay | Admitting: *Deleted

## 2017-02-21 ENCOUNTER — Emergency Department (HOSPITAL_COMMUNITY): Payer: Managed Care, Other (non HMO)

## 2017-02-21 DIAGNOSIS — S93402A Sprain of unspecified ligament of left ankle, initial encounter: Secondary | ICD-10-CM | POA: Diagnosis not present

## 2017-02-21 DIAGNOSIS — X509XXA Other and unspecified overexertion or strenuous movements or postures, initial encounter: Secondary | ICD-10-CM | POA: Diagnosis not present

## 2017-02-21 DIAGNOSIS — I252 Old myocardial infarction: Secondary | ICD-10-CM | POA: Insufficient documentation

## 2017-02-21 DIAGNOSIS — Y998 Other external cause status: Secondary | ICD-10-CM | POA: Insufficient documentation

## 2017-02-21 DIAGNOSIS — I10 Essential (primary) hypertension: Secondary | ICD-10-CM | POA: Insufficient documentation

## 2017-02-21 DIAGNOSIS — Y9301 Activity, walking, marching and hiking: Secondary | ICD-10-CM | POA: Insufficient documentation

## 2017-02-21 DIAGNOSIS — Y929 Unspecified place or not applicable: Secondary | ICD-10-CM | POA: Insufficient documentation

## 2017-02-21 DIAGNOSIS — Z7982 Long term (current) use of aspirin: Secondary | ICD-10-CM | POA: Diagnosis not present

## 2017-02-21 DIAGNOSIS — I2510 Atherosclerotic heart disease of native coronary artery without angina pectoris: Secondary | ICD-10-CM | POA: Insufficient documentation

## 2017-02-21 DIAGNOSIS — Z79899 Other long term (current) drug therapy: Secondary | ICD-10-CM | POA: Diagnosis not present

## 2017-02-21 DIAGNOSIS — Z7902 Long term (current) use of antithrombotics/antiplatelets: Secondary | ICD-10-CM | POA: Insufficient documentation

## 2017-02-21 DIAGNOSIS — Z87891 Personal history of nicotine dependence: Secondary | ICD-10-CM | POA: Insufficient documentation

## 2017-02-21 DIAGNOSIS — S99912A Unspecified injury of left ankle, initial encounter: Secondary | ICD-10-CM | POA: Diagnosis present

## 2017-02-21 MED ORDER — HYDROCODONE-ACETAMINOPHEN 5-325 MG PO TABS: 1.0000 | ORAL_TABLET | ORAL | 0 refills | Status: DC | PRN

## 2017-02-21 NOTE — ED Triage Notes (Signed)
Left ankle injury 2 days ago

## 2017-02-21 NOTE — ED Provider Notes (Signed)
[facility] EMERGENCY DEPARTMENT Provider Note   CSN: [id number] Arrival date & time: 02/21/17  1030     History   Chief Complaint Chief Complaint  Patient presents with  . Ankle Pain    HPI Seth Graves is a 56 y.o. male with left ankle pain which occurred suddenly when the patient inverted his ankle while walking 2 days ago.  Pain is aching, constant and worse with palpation, movement and weight bearing.  The patient was able to weight bear immediately after the event.  There is no radiation of pain and the patient denies numbness distal to the injury site.  The patients treatment prior to arrival included elevation, crutches and tramadol.  .  The history is provided by the patient.    Past Medical History:  Diagnosis Date  . Arthritis   . Coronary artery disease    NSTEMI 05/2012 s/p PTCA/DES of the mid RCA, ramus intermediate, and mid left circumflex, LVEF 55-60%  . Essential hypertension, benign   . Hypercholesteremia   . Hyperglycemia   . NSTEMI (non-ST elevated myocardial infarction) [facility])    February 2014    Patient Active Problem List   Diagnosis Date Noted  . Hyperlipidemia 06/11/2012  . Essential hypertension, benign 06/11/2012  . Hyperglycemia 06/11/2012  . Gout 06/11/2012  . Coronary atherosclerosis of native coronary artery 06/03/2012    Past Surgical History:  Procedure Laterality Date  . BACK SURGERY    . HERNIA REPAIR    . NECK SURGERY         Home Medications    Prior to Admission medications   Medication Sig Start Date End Date Taking? Authorizing Provider  aspirin EC 81 MG EC tablet Take 1 tablet (81 mg total) by mouth daily. 06/05/12   Dunn, Nedra Hai, PA-C  clopidogrel (PLAVIX) 75 MG tablet TAKE ONE TABLET BY MOUTH ONCE DAILY 10/10/16   Satira Sark, MD  clopidogrel (PLAVIX) 75 MG tablet TAKE ONE TABLET BY MOUTH ONCE DAILY 10/15/16   Satira Sark, MD  HYDROcodone-acetaminophen (NORCO/VICODIN) 5-325 MG tablet Take 1 tablet every  4 (four) hours as needed by mouth. 02/21/17   Evalee Jefferson, PA-C  lisinopril (PRINIVIL,ZESTRIL) 5 MG tablet TAKE 1 TABLET BY MOUTH ONCE DAILY 02/18/17   Satira Sark, MD  metoprolol tartrate (LOPRESSOR) 25 MG tablet Take 1 tablet (25 mg total) by mouth 2 (two) times daily. 05/10/14   Satira Sark, MD  metoprolol tartrate (LOPRESSOR) 25 MG tablet TAKE ONE TABLET BY MOUTH TWICE DAILY 07/02/16   Satira Sark, MD  nitroGLYCERIN (NITROSTAT) 0.4 MG SL tablet Place 1 tablet (0.4 mg total) under the tongue every 5 (five) minutes as needed for chest pain (up to 3 doses). 01/19/15   Satira Sark, MD  rosuvastatin (CRESTOR) 10 MG tablet Take 1 tablet (10 mg total) by mouth daily. 04/27/16 07/26/16  Satira Sark, MD    Family History Family History  Problem Relation Age of Onset  . CAD Father        MI in his 68s  . CVA Maternal Grandmother   . Aneurysm Maternal Grandfather   . Heart attack Paternal Grandmother   . Alzheimer's disease Paternal Grandmother   . Heart attack Paternal Grandfather     Social History Social History   Tobacco Use  . Smoking status: Former Smoker    Types: Cigarettes    Start date: 04/16/1980    Last attempt to quit: 04/16/2001    Years  since quitting: 15.8  . Smokeless tobacco: Never Used  Substance Use Topics  . Alcohol use: No    Alcohol/week: 0.0 oz  . Drug use: No     Allergies   Atorvastatin   Review of Systems Review of Systems  Musculoskeletal: Positive for arthralgias and joint swelling.  Skin: Negative for wound.  Neurological: Negative for weakness and numbness.     Physical Exam Updated Vital Signs BP (!) 166/101 (BP Location: Right Arm) Comment: pt reports that he missed pm dose of BP med and just took am dose PTA.  Pulse 75   Temp 98.6 F (37 C)   Resp 20   Ht 6' (1.829 m)   Wt 106.6 kg (235 lb)   SpO2 96%   BMI 31.87 kg/m   Physical Exam  Constitutional: He appears well-developed and well-nourished.  HENT:    Head: Normocephalic.  Cardiovascular: Normal rate and intact distal pulses. Exam reveals no decreased pulses.  Pulses:      Dorsalis pedis pulses are 2+ on the right side, and 2+ on the left side.       Posterior tibial pulses are 2+ on the right side, and 2+ on the left side.  Musculoskeletal: He exhibits edema and tenderness.       Left ankle: He exhibits swelling. He exhibits no ecchymosis, no deformity and normal pulse. Tenderness. Lateral malleolus tenderness found. No head of 5th metatarsal and no proximal fibula tenderness found. Achilles tendon normal.  Neurological: He is alert. No sensory deficit.  Skin: Skin is warm, dry and intact.  Nursing note and vitals reviewed.    ED Treatments / Results  Labs (all labs ordered are listed, but only abnormal results are displayed) Labs Reviewed - No data to display  EKG  EKG Interpretation None       Radiology Dg Ankle Complete Left  Result Date: 02/21/2017 CLINICAL DATA:  Twisting injury of the left ankle 2 days ago symptoms are centered over the lateral malleolus in our relieved with rest. Previous left ankle injury. EXAM: LEFT ANKLE COMPLETE - 3+ VIEW COMPARISON:  Left foot series of December 26, 2009 FINDINGS: The bones are subjectively adequately mineralized. There are chronic changes of the medial malleolus which appears stable. The ankle joint mortise is preserved. The talar dome is intact. No acute malleolar fracture is observed. There is mild soft tissue swelling anteriorly as well as both medially and laterally. IMPRESSION: Chronic post traumatic changes of the medial malleolus. Diffuse soft tissue swelling. No acute bony abnormality. Electronically Signed   By: David  Martinique M.D.   On: 02/21/2017 13:12    Procedures Procedures (including critical care time)  Medications Ordered in ED Medications - No data to display   Initial Impression / Assessment and Plan / ED Course  I have reviewed the triage vital signs and  the nursing notes.  Pertinent labs & imaging results that were available during my care of the patient were reviewed by me and considered in my medical decision making (see chart for details).     RICE, aso, pt has crutches.  Prn f/u pcp 1 week for recheck.  Patient unable to tolerate NSAIDs as he is on Plavix secondary to CAD.  Final Clinical Impressions(s) / ED Diagnoses   Final diagnoses:  Sprain of left ankle, unspecified ligament, initial encounter    ED Discharge Orders        Ordered    HYDROcodone-acetaminophen (NORCO/VICODIN) 5-325 MG tablet  Every 4 hours PRN  02/21/17 1333       Evalee Jefferson, PA-C 02/21/17 1338    Julianne Rice, MD 02/27/17 [id number]

## 2017-02-21 NOTE — Discharge Instructions (Signed)
Wear the splint given until your pain is completely resolved while walking and standing.  Continue using the crutches until you can comfortably weight-bear without increased pain.  Elevate and use ice as much as tolerated over the next 2 days. You may take the hydrocodone prescribed for pain relief.  This will make you drowsy - do not drive within 4 hours of taking this medication.

## 2017-07-12 ENCOUNTER — Ambulatory Visit: Payer: Managed Care, Other (non HMO) | Admitting: Student

## 2017-07-12 ENCOUNTER — Encounter: Payer: Self-pay | Admitting: Student

## 2017-07-12 VITALS — BP 140/80 | HR 82 | Ht 72.0 in | Wt 240.0 lb

## 2017-07-12 DIAGNOSIS — E782 Mixed hyperlipidemia: Secondary | ICD-10-CM | POA: Diagnosis not present

## 2017-07-12 DIAGNOSIS — I2510 Atherosclerotic heart disease of native coronary artery without angina pectoris: Secondary | ICD-10-CM

## 2017-07-12 DIAGNOSIS — I10 Essential (primary) hypertension: Secondary | ICD-10-CM

## 2017-07-12 MED ORDER — ROSUVASTATIN CALCIUM 10 MG PO TABS: 10.0000 mg | ORAL_TABLET | ORAL | 4 refills | Status: DC

## 2017-07-12 MED ORDER — LISINOPRIL 5 MG PO TABS: 5.0000 mg | ORAL_TABLET | Freq: Every day | ORAL | 4 refills | Status: DC

## 2017-07-12 MED ORDER — METOPROLOL TARTRATE 25 MG PO TABS: 25.0000 mg | ORAL_TABLET | Freq: Two times a day (BID) | ORAL | 4 refills | Status: DC

## 2017-07-12 MED ORDER — CLOPIDOGREL BISULFATE 75 MG PO TABS: 75.0000 mg | ORAL_TABLET | Freq: Every day | ORAL | 4 refills | Status: DC

## 2017-07-12 NOTE — Patient Instructions (Addendum)
Your physician wants you to follow-up in:6 months with Dr Ferne Reus will receive a reminder letter in the mail two months in advance. If you don't receive a letter, please call our office to schedule the follow-up appointment.     Take Crestor Every OTHER Day    I have refilled all your cardiac medications   Please call in 2 weeks with BP readings  No lab work or tests ordered today    If you need a refill on your cardiac medications before your next appointment, please call your pharmacy.      Thank you for choosing [facility] !

## 2017-07-12 NOTE — Progress Notes (Signed)
Cardiology Office Note    Date:  07/12/2017   ID:  Seth Graves, DOB [birth year], MRN [id number]  PCP:  [facility]  Cardiologist: Rozann Lesches, MD    Chief Complaint  Patient presents with  . Follow-up    Overdue annual visit    History of Present Illness:    Seth Graves is a 57 y.o. male with past medical history of CAD (s/p NSTEMI in 2014 with DES to RCA, RI, and LCx with low-risk NST in 07/2015), HTN, HLD, and prior tobacco use who presents to thefollo office today for overdue annual follow-up.   He was last examined by Dr. Domenic Polite in 04/2016 and denied any recent dyspnea on exertion or chest discomfort at that time. He was continued on ASA, Plavix, BB, and statin therapy.   In talking with the patient today, he overall reports doing well from a cardiac perspective since his last office visit. He did have a DOT physical last month and says his blood pressure was significantly elevated initially with SBP in the 160's. He sat down and relaxed and this improved prior to the completion of his testing. He has purchased a blood pressure cuff and says BP has been variable in the 130's -150's/80's-90's. Reports having anxiety and associated stress and feels like this might contribute to his elevated readings at various times.  He denies any recent exertional chest pain or dyspnea on exertion. 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 He has noted an occasional shooting pain that can occur along his left or right pectoral region and only lasts for a few seconds at a time and spontaneously resolves.  Denies any symptoms similar to when he required stent placement in 2014.  He reports good compliance with Aspirin and Plavix and denies any evidence of active bleeding. He did stop taking Crestor due to myalgias, but says these were less severe as compared to when he was on Lipitor or  Simvastatin in the past.    Past Medical  History:  Diagnosis Date  . Arthritis   . Coronary artery disease    NSTEMI 05/2012 s/p PTCA/DES of the mid RCA, ramus intermediate, and mid left circumflex, LVEF 55-60%  . Essential hypertension, benign   . Hypercholesteremia   . Hyperglycemia   . NSTEMI (non-ST elevated myocardial infarction) [facility])    February 2014    Past Surgical History:  Procedure Laterality Date  . BACK SURGERY    . HERNIA REPAIR    . LEFT HEART CATHETERIZATION WITH CORONARY ANGIOGRAM N/A 06/04/2012   Procedure: LEFT HEART CATHETERIZATION WITH CORONARY ANGIOGRAM;  Surgeon: Peter M Martinique, MD;  Location: [facility] CATH LAB;  Service: Cardiovascular;  Laterality: N/A;  . NECK SURGERY    . PERCUTANEOUS CORONARY STENT INTERVENTION (PCI-S)  06/04/2012   Procedure: PERCUTANEOUS CORONARY STENT INTERVENTION (PCI-S);  Surgeon: Peter M Martinique, MD;  Location: [facility] CATH LAB;  Service: Cardiovascular;;    Current Medications: Outpatient Medications Prior to Visit  Medication Sig Dispense Refill  . aspirin EC 81 MG EC tablet Take 1 tablet (81 mg total) by mouth daily.    . cholecalciferol (VITAMIN D) 1000 units tablet Take 1,000 Units by mouth daily.    . nitroGLYCERIN (NITROSTAT) 0.4 MG SL tablet Place 1 tablet (0.4 mg total) under the tongue every 5 (five) minutes as needed for chest pain (up to 3 doses). 25 tablet 4  . clopidogrel (PLAVIX) 75 MG tablet TAKE  ONE TABLET BY MOUTH ONCE DAILY 90 tablet 0  . clopidogrel (PLAVIX) 75 MG tablet TAKE ONE TABLET BY MOUTH ONCE DAILY 90 tablet 3  . HYDROcodone-acetaminophen (NORCO/VICODIN) 5-325 MG tablet Take 1 tablet every 4 (four) hours as needed by mouth. 15 tablet 0  . lisinopril (PRINIVIL,ZESTRIL) 5 MG tablet TAKE 1 TABLET BY MOUTH ONCE DAILY 90 tablet 3  . metoprolol tartrate (LOPRESSOR) 25 MG tablet Take 1 tablet (25 mg total) by mouth 2 (two) times daily. 180 tablet 3  . metoprolol tartrate (LOPRESSOR) 25 MG tablet TAKE ONE TABLET BY MOUTH TWICE DAILY 180 tablet 1  . rosuvastatin  (CRESTOR) 10 MG tablet Take 1 tablet (10 mg total) by mouth daily. 90 tablet 3   No facility-administered medications prior to visit.      Allergies:   Atorvastatin   Social History   Socioeconomic History  . Marital status: Divorced    Spouse name: Not on file  . Number of children: Not on file  . Years of education: Not on file  . Highest education level: Not on file  Occupational History  . Not on file  Social Needs  . Financial resource strain: Not on file  . Food insecurity:    Worry: Not on file    Inability: Not on file  . Transportation needs:    Medical: Not on file    Non-medical: Not on file  Tobacco Use  . Smoking status: Former Smoker    Types: Cigarettes    Start date: 04/16/1980    Last attempt to quit: 04/16/2001    Years since quitting: 16.2  . Smokeless tobacco: Never Used  Substance and Sexual Activity  . Alcohol use: No    Alcohol/week: 0.0 oz  . Drug use: No  . Sexual activity: Yes    Birth control/protection: None  Lifestyle  . Physical activity:    Days per week: Not on file    Minutes per session: Not on file  . Stress: Not on file  Relationships  . Social connections:    Talks on phone: Not on file    Gets together: Not on file    Attends religious service: Not on file    Active member of club or organization: Not on file    Attends meetings of clubs or organizations: Not on file    Relationship status: Not on file  Other Topics Concern  . Not on file  Social History Narrative  . Not on file     Family History:  The patient's family history includes Alzheimer's disease in his paternal grandmother; Aneurysm in his maternal grandfather; CAD in his father; CVA in his maternal grandmother; Heart attack in his paternal grandfather and paternal grandmother.   Review of Systems:   Please see the history of present illness.     General:  No chills, fever, night sweats or weight changes. Positive for anxiety and myalgias.  Cardiovascular:  No  chest pain, dyspnea on exertion, edema, orthopnea, palpitations, paroxysmal nocturnal dyspnea. Dermatological: No rash, lesions/masses Respiratory: No cough, dyspnea Urologic: No hematuria, dysuria Abdominal:   No nausea, vomiting, diarrhea, bright red blood per rectum, melena, or hematemesis Neurologic:  No visual changes, wkns, changes in mental status.  All other systems reviewed and are otherwise negative except as noted above.   Physical Exam:    VS:  BP 140/80 (BP Location: Left Arm)   Pulse 82   Ht 6' (1.829 m)   Wt 240 lb (108.9 kg)  SpO2 95%   BMI 32.55 kg/m    General: Well developed, well nourished Caucasian male appearing in no acute distress. Head: Normocephalic, atraumatic, sclera non-icteric, no xanthomas, nares are without discharge.  Neck: No carotid bruits. JVD not elevated.  Lungs: Respirations regular and unlabored, without wheezes or rales.  Heart: Regular rate and rhythm. No S3 or S4.  No murmur, no rubs, or gallops appreciated. Abdomen: Soft, non-tender, non-distended with normoactive bowel sounds. No hepatomegaly. No rebound/guarding. No obvious abdominal masses. Msk:  Strength and tone appear normal for age. No joint deformities or effusions. Extremities: No clubbing or cyanosis. No lower extremity edema.  Distal pedal pulses are 2+ bilaterally. Neuro: Alert and oriented X 3. Moves all extremities spontaneously. No focal deficits noted. Psych:  Responds to questions appropriately with a normal affect. Skin: No rashes or lesions noted  Wt Readings from Last 3 Encounters:  07/12/17 240 lb (108.9 kg)  02/21/17 235 lb (106.6 kg)  04/27/16 239 lb (108.4 kg)     Studies/Labs Reviewed:   EKG:  EKG is ordered today. The ekg ordered today demonstrates NSR, HR 77, with moderate LVH and inferior Q-waves. Isolated TWI along Lead III.   Recent Labs: No results found for requested labs within last 8760 hours.   Lipid Panel    Component Value Date/Time   CHOL  186 04/27/2016 0958   TRIG 154 (H) 04/27/2016 0958   HDL 48 04/27/2016 0958   CHOLHDL 3.9 04/27/2016 0958   VLDL 31 (H) 04/27/2016 0958   LDLCALC 107 (H) 04/27/2016 0958    Additional studies/ records that were reviewed today include:   Cardiac Catheterization: 05/2012 [facility] FINDINGS Hemodynamics: AO 132/85 with a mean of 108 mmHg LV 132/13 mmHg               Coronary angiography: Coronary dominance: right  Left mainstem: Large and normal  Left anterior descending (LAD): The left anterior descending artery is a large vessel extends around the apex. There is a long 30% stenosis in the mid vessel. The first diagonal is without significant disease.  Ramus intermediate: This is a large branch which has a 90-95% stenosis in the proximal vessel.  Left circumflex (LCx): There is a long segmental stenosis in the mid vessel up to 70%.  Right coronary artery (RCA):  The right coronary is a very large dominant vessel. It has mild disease in the proximal vessel up to 20-30%. In the mid vessel there is a focal stenosis of 90-95% at an acute angulated bend. There is ectasia following this lesion and then a segment of 30% disease distal.  Left ventriculography: Left ventricular systolic function is normal, LVEF is estimated at 55-60%, there is mild mid anterior hypokinesis, there is no significant mitral regurgitation   PCI Note:  Following the diagnostic procedure, the decision was made to proceed with PCI. The radial sheath was upsized to a 6 [place]. Brilinta 180 mg was given orally. Weight-based bivalirudin was given for anticoagulation. Once a therapeutic ACT was achieved, a 6 [place] XB RCA guide catheter was inserted.  A pro-water coronary guidewire was used to cross the lesion in the mid RCA.  The lesion was predilated with a 2.5 mm balloon.  The lesion was then stented with a 4.0 x 28 mm Promus premier stent.  The stent was postdilated with a 4.5 mm noncompliant balloon.  Following  PCI, there was 0% residual stenosis and TIMI-3 flow. Final angiography confirmed an excellent result.  We then proceeded with  intervention of the ramus intermediate branch. We switched to a XB LAD 3.5 guide. A pro-water coronary guidewire was used to cross the lesion. The lesion was predilated with a 2.5 mm balloon. The lesion was then stented with a 3.0 x 20 mm Promus premier stent the stent was postdilated with a 3.0 mm noncompliant balloon. Following PCI, there was 0% residual stenosis and TIMI grade 3 flow. Final angiography confirmed an excellent result.  We next proceeded with intervention of the mid left circumflex. This lesion was crossed with the pro-water. It was predilated using a 2.5 mm balloon. It was then stented with a 3.0 x 20 mm Promus premier stent. The stent was postdilated with a 3.0 mm noncompliant balloon. Following PCI, there was 0% residual stenosis and TIMI grade 3 flow. Final angiography confirmed an excellent result. The patient tolerated the procedure well. There were no immediate procedural complications. A TR band was used for radial hemostasis. The patient was transferred to the post catheterization recovery area for further monitoring.  PCI Data: Vessel #1 - RCA/Segment - mid Percent Stenosis (pre)  90-95% TIMI-flow 3 Stent 4.0 x 28 mm Promus premier Percent Stenosis (post) 0% TIMI-flow (post) 3  Vessel #2-ramus intermediate/proximal Percent stenoses: 90-95% TIMI flow 3 Stent: 3.0 x 20 mm Promus premier Percent stenoses (post): 0% TIMI flow: 3  Vessel #3-left circumflex/mid Percent stenoses: 70% TIMI flow 3 Stent: 3.0 x 20 mm Promus premier Percent stenoses (post) 0% TIMI flow: 3  Final Conclusions:   1. Three-vessel obstructive coronary disease. 2. Good left ventricular function. 3. Successful stenting of the mid RCA, proximal ramus intermediate, and mid left circumflex with drug-eluting stents.   Recommendations:  Risk factor modification.  Recommend dual antiplatelet therapy for one year.   NST: 07/2015  There was no ST segment deviation noted during stress.  Findings consistent with prior mild basal inferoseptal myocardial infarction with very mild peri-infarct ischemia.  This is a low risk study.  The left ventricular ejection fraction is normal (55-65%).  Assessment:    1. Coronary artery disease involving native coronary artery of native heart without angina pectoris   2. Essential hypertension   3. Mixed hyperlipidemia      Plan:   In order of problems listed above:  1. CAD - s/p NSTEMI in 2014 with DES to RCA, RI, and LCx with low-risk NST in 07/2015. - he does not exercise regularly but is active at baseline. Denies any exertional chest pain or dyspnea on exertion. He does report having shooting pains along his right and left pectoral regions only lasting for a few seconds at a time which seems very atypical for angina. No symptoms similar to prior UA episodes. - will continue with current medication regimen of ASA, Plavix, and BB therapy. Will restart statin therapy as outlined below.   2. HTN - BP is slightly elevated at 140/80 during today's visit. He has been getting variable readings when checking this at home over the past 2 weeks. I have encouraged him to continue to follow blood pressure in the ambulatory setting and to call back with his readings. - He is currently on Lisinopril 5 mg daily and Lopressor 25 mg twice daily. If BP is not at goal, would recommend titration of Lisinopril to 10 mg daily. He would need a repeat BMET within 2 weeks following dose adjustment.   3. HLD - FLP in 04/2016 showed total cholesterol 186, triglycerides 154, HDL 48, and LDL 107. Experienced muscle aches with high-dose  Lipitor and was switched to Crestor in 2017. - He has since self-discontinued Crestor. We reviewed trying this every other day or even just once weekly.  He is open to this and we will plan to start  Crestor 10 mg every other day. If compliant, would plan to recheck FLP and LFT's in 8 weeks.    Medication Adjustments/Labs and Tests Ordered: Current medicines are reviewed at length with the patient today.  Concerns regarding medicines are outlined above.  Medication changes, Labs and Tests ordered today are listed in the Patient Instructions below. Patient Instructions  Your physician wants you to follow-up in:6 months with Dr Ferne Reus will receive a reminder letter in the mail two months in advance. If you don't receive a letter, please call our office to schedule the follow-up appointment.   Take Crestor Every OTHER Day  I have refilled all your cardiac medications  Please call in 2 weeks with BP readings  No lab work or tests ordered today  If you need a refill on your cardiac medications before your next appointment, please call your pharmacy.  Thank you for choosing [facility] !          Signed, Erma Heritage, PA-C  07/12/2017 5:12 PM    [place] [address] [place], [place] [address] Phone: [phone]

## 2017-09-01 ENCOUNTER — Encounter (HOSPITAL_COMMUNITY): Payer: Self-pay | Admitting: Emergency Medicine

## 2017-09-01 ENCOUNTER — Emergency Department (HOSPITAL_COMMUNITY)
Admission: EM | Admit: 2017-09-01 | Discharge: 2017-09-02 | Disposition: A | Discharge status: Home / Self Care | Payer: Managed Care, Other (non HMO) | Attending: Emergency Medicine | Admitting: Emergency Medicine

## 2017-09-01 DIAGNOSIS — Z7902 Long term (current) use of antithrombotics/antiplatelets: Secondary | ICD-10-CM | POA: Insufficient documentation

## 2017-09-01 DIAGNOSIS — Z87891 Personal history of nicotine dependence: Secondary | ICD-10-CM | POA: Diagnosis not present

## 2017-09-01 DIAGNOSIS — I2510 Atherosclerotic heart disease of native coronary artery without angina pectoris: Secondary | ICD-10-CM | POA: Diagnosis not present

## 2017-09-01 DIAGNOSIS — H5789 Other specified disorders of eye and adnexa: Secondary | ICD-10-CM | POA: Diagnosis present

## 2017-09-01 DIAGNOSIS — Z79899 Other long term (current) drug therapy: Secondary | ICD-10-CM | POA: Diagnosis not present

## 2017-09-01 DIAGNOSIS — Z7982 Long term (current) use of aspirin: Secondary | ICD-10-CM | POA: Insufficient documentation

## 2017-09-01 DIAGNOSIS — H1031 Unspecified acute conjunctivitis, right eye: Secondary | ICD-10-CM | POA: Diagnosis not present

## 2017-09-01 DIAGNOSIS — I10 Essential (primary) hypertension: Secondary | ICD-10-CM | POA: Insufficient documentation

## 2017-09-01 MED ORDER — TOBRAMYCIN 0.3 % OP SOLN
1.0000 [drp] | Freq: Once | OPHTHALMIC | Status: AC
  Administered 2017-09-02: 1 [drp] via OPHTHALMIC
  Filled 2017-09-01: qty 5

## 2017-09-01 NOTE — ED Provider Notes (Signed)
[facility] EMERGENCY DEPARTMENT Provider Note   CSN: [id number] Arrival date & time: 09/01/17  2050     History   Chief Complaint Chief Complaint  Patient presents with  . Foreign Body in [place] Seth Graves is a 57 y.o. male with a history of CAD, hypertension, hyperglycemia on Plavix presenting with a small amount of bloody drainage from his right eye which occurred prior to arrival.  He is been sleeping when he awoke and felt irritation in his right eye and when he looked in the mirror there was a white swollen nodule behind his lower lid in the lateral corner which she stated looked like it had "blood vessels", his eye been started to bleed and this nodule has since disappeared.  He continues to have mild irritation of the eye, denies any vision changes.  He denies any trauma to the eye.  The bleeding has resolved.  The history is provided by the patient and the spouse.    Past Medical History:  Diagnosis Date  . Arthritis   . Coronary artery disease    NSTEMI 05/2012 s/p PTCA/DES of the mid RCA, ramus intermediate, and mid left circumflex, LVEF 55-60%  . Essential hypertension, benign   . Hypercholesteremia   . Hyperglycemia   . NSTEMI (non-ST elevated myocardial infarction) [facility])    February 2014    Patient Active Problem List   Diagnosis Date Noted  . Hyperlipidemia 06/11/2012  . Essential hypertension, benign 06/11/2012  . Hyperglycemia 06/11/2012  . Gout 06/11/2012  . Coronary atherosclerosis of native coronary artery 06/03/2012    Past Surgical History:  Procedure Laterality Date  . BACK SURGERY    . HERNIA REPAIR    . LEFT HEART CATHETERIZATION WITH CORONARY ANGIOGRAM N/A 06/04/2012   Procedure: LEFT HEART CATHETERIZATION WITH CORONARY ANGIOGRAM;  Surgeon: Peter M Martinique, MD;  Location: [facility] CATH LAB;  Service: Cardiovascular;  Laterality: N/A;  . NECK SURGERY    . PERCUTANEOUS CORONARY STENT INTERVENTION (PCI-S)  06/04/2012   Procedure: PERCUTANEOUS  CORONARY STENT INTERVENTION (PCI-S);  Surgeon: Peter M Martinique, MD;  Location: [facility] CATH LAB;  Service: Cardiovascular;;        Home Medications    Prior to Admission medications   Medication Sig Start Date End Date Taking? Authorizing Provider  aspirin EC 81 MG EC tablet Take 1 tablet (81 mg total) by mouth daily. 06/05/12   Dunn, Nedra Hai, PA-C  cholecalciferol (VITAMIN D) 1000 units tablet Take 1,000 Units by mouth daily.    [provider]  clopidogrel (PLAVIX) 75 MG tablet Take 1 tablet (75 mg total) by mouth daily. 07/12/17   Strader, Fransisco Hertz, PA-C  lisinopril (PRINIVIL,ZESTRIL) 5 MG tablet Take 1 tablet (5 mg total) by mouth daily. 07/12/17   Strader, Fransisco Hertz, PA-C  metoprolol tartrate (LOPRESSOR) 25 MG tablet Take 1 tablet (25 mg total) by mouth 2 (two) times daily. 07/12/17   Strader, Fransisco Hertz, PA-C  nitroGLYCERIN (NITROSTAT) 0.4 MG SL tablet Place 1 tablet (0.4 mg total) under the tongue every 5 (five) minutes as needed for chest pain (up to 3 doses). 01/19/15   Satira Sark, MD  rosuvastatin (CRESTOR) 10 MG tablet Take 1 tablet (10 mg total) by mouth every other day. 07/12/17 10/10/17  Erma Heritage, PA-C    Family History Family History  Problem Relation Age of Onset  . CAD Father        MI in his 34s  . CVA  Maternal Grandmother   . Aneurysm Maternal Grandfather   . Heart attack Paternal Grandmother   . Alzheimer's disease Paternal Grandmother   . Heart attack Paternal Grandfather     Social History Social History   Tobacco Use  . Smoking status: Former Smoker    Types: Cigarettes    Start date: 04/16/1980    Last attempt to quit: 04/16/2001    Years since quitting: 16.3  . Smokeless tobacco: Never Used  Substance Use Topics  . Alcohol use: No    Alcohol/week: 0.0 oz  . Drug use: No     Allergies   Atorvastatin   Review of Systems Review of Systems  Constitutional: Negative for fever.  HENT: Negative for congestion and sore throat.     Eyes: Positive for redness.  Respiratory: Negative for chest tightness and shortness of breath.   Cardiovascular: Negative for chest pain.  Gastrointestinal: Negative for abdominal pain and nausea.  Genitourinary: Negative.   Musculoskeletal: Negative for arthralgias, joint swelling and neck pain.  Skin: Negative.  Negative for rash and wound.  Neurological: Negative for dizziness, weakness, light-headedness, numbness and headaches.  Psychiatric/Behavioral: Negative.      Physical Exam Updated Vital Signs BP (!) 157/113 (BP Location: Right Arm)   Pulse 99   Temp 99.9 F (37.7 C) (Oral)   Resp 20   Ht 6' (1.829 m)   Wt 111.1 kg (245 lb)   SpO2 96%   BMI 33.23 kg/m   Physical Exam  Constitutional: He appears well-developed and well-nourished.  HENT:  Head: Normocephalic and atraumatic.  Eyes: Pupils are equal, round, and reactive to light. EOM and lids are normal. Lids are everted and swept, no foreign bodies found. Right eye exhibits no chemosis, no discharge and no exudate. No foreign body present in the right eye. Right conjunctiva is injected. Right conjunctiva has no hemorrhage. Left conjunctiva is not injected. Left conjunctiva has no hemorrhage.  Slit lamp exam:      The right eye shows no corneal abrasion, no corneal flare, no corneal ulcer, no foreign body and no fluorescein uptake.  Visual Acuity Bilateral Near: 20/20  Bilateral Distance: 20/20  R Near: 20/20  R Distance: 20/20  L Near: 20/20  L Distance: 20/20     Neck: Normal range of motion.  Cardiovascular: Normal rate, regular rhythm, normal heart sounds and intact distal pulses.  Pulmonary/Chest: Effort normal and breath sounds normal. He has no wheezes.  Abdominal: Soft. Bowel sounds are normal. There is no tenderness.  Musculoskeletal: Normal range of motion.  Neurological: He is alert.  Skin: Skin is warm and dry.  Psychiatric: He has a normal mood and affect.  Nursing note and vitals  reviewed.    ED Treatments / Results  Labs (all labs ordered are listed, but only abnormal results are displayed) Labs Reviewed - No data to display  EKG None  Radiology No results found.  Procedures Procedures (including critical care time)  Medications Ordered in ED Medications  tobramycin (TOBREX) 0.3 % ophthalmic solution 1 drop (1 drop Right Eye Given 09/02/17 0002)     Initial Impression / Assessment and Plan / ED Course  I have reviewed the triage vital signs and the nursing notes.  Pertinent labs & imaging results that were available during my care of the patient were reviewed by me and considered in my medical decision making (see chart for details).     Pt with mild appearing right conjunctivitis limited mostly to the right lateral  conjunctival.  Suspect he may have burst a small external vessel which has resolved. No bleeding currently. Vision stable.  Will cover for conjunctivitis, tobrex started.  Plan referral to ophthalmology for recheck for recheck, Dr. Manuella Ghazi.  Final Clinical Impressions(s) / ED Diagnoses   Final diagnoses:  Acute conjunctivitis of right eye, unspecified acute conjunctivitis type    ED Discharge Orders    None       Landis Martins 09/02/17 0005    Ezequiel Essex, MD 09/02/17 [phone]

## 2017-09-01 NOTE — ED Triage Notes (Addendum)
Pt states he thinks he has something in his Right eye. States when he looked in mirror, "it looked like blood was seeping out of it".

## 2017-09-02 NOTE — Discharge Instructions (Addendum)
As discussed, your eye exam is reassuring and normal at this time, except for some inflammation (redness) of the sclera of your eye.  Apply the tobrex given - 1 drop in your eye 4 times per day for the next week to help protect it from infection as this inflammation heals.  You should see an ophthalmologist if your symptoms persist or worsen.  You can call Dr. Manuella Ghazi for this appointment. Your blood pressure was elevated tonight at 188/112.  You should have this rechecked within the next week.

## 2018-01-06 ENCOUNTER — Other Ambulatory Visit: Payer: Self-pay

## 2018-01-06 ENCOUNTER — Emergency Department (HOSPITAL_COMMUNITY): Payer: Managed Care, Other (non HMO)

## 2018-01-06 ENCOUNTER — Emergency Department (HOSPITAL_COMMUNITY)
Admission: EM | Admit: 2018-01-06 | Discharge: 2018-01-06 | Disposition: A | Discharge status: Home / Self Care | Payer: Managed Care, Other (non HMO) | Attending: Emergency Medicine | Admitting: Emergency Medicine

## 2018-01-06 ENCOUNTER — Encounter (HOSPITAL_COMMUNITY): Payer: Self-pay | Admitting: *Deleted

## 2018-01-06 DIAGNOSIS — Z7982 Long term (current) use of aspirin: Secondary | ICD-10-CM | POA: Insufficient documentation

## 2018-01-06 DIAGNOSIS — Z7902 Long term (current) use of antithrombotics/antiplatelets: Secondary | ICD-10-CM | POA: Insufficient documentation

## 2018-01-06 DIAGNOSIS — I252 Old myocardial infarction: Secondary | ICD-10-CM | POA: Insufficient documentation

## 2018-01-06 DIAGNOSIS — I2510 Atherosclerotic heart disease of native coronary artery without angina pectoris: Secondary | ICD-10-CM | POA: Insufficient documentation

## 2018-01-06 DIAGNOSIS — I10 Essential (primary) hypertension: Secondary | ICD-10-CM | POA: Insufficient documentation

## 2018-01-06 DIAGNOSIS — Z79899 Other long term (current) drug therapy: Secondary | ICD-10-CM | POA: Diagnosis not present

## 2018-01-06 DIAGNOSIS — Z87891 Personal history of nicotine dependence: Secondary | ICD-10-CM | POA: Diagnosis not present

## 2018-01-06 DIAGNOSIS — R51 Headache: Secondary | ICD-10-CM | POA: Diagnosis present

## 2018-01-06 LAB — COMPREHENSIVE METABOLIC PANEL
ALT: 31 U/L (ref 0–44)
AST: 26 U/L (ref 15–41)
Albumin: 4.3 g/dL (ref 3.5–5.0)
Alkaline Phosphatase: 44 U/L (ref 38–126)
Anion gap: 9 (ref 5–15)
BUN: 12 mg/dL (ref 6–20)
CO2: 30 mmol/L (ref 22–32)
Calcium: 8.9 mg/dL (ref 8.9–10.3)
Chloride: 101 mmol/L (ref 98–111)
Creatinine, Ser: 1.3 mg/dL — ABNORMAL HIGH (ref 0.61–1.24)
GFR calc Af Amer: >60 mL/min (ref >60)
GFR calc non Af Amer: >60 mL/min (ref >60)
Glucose, Bld: 137 mg/dL — ABNORMAL HIGH (ref 70–99)
Potassium: 3.8 mmol/L (ref 3.5–5.1)
Sodium: 140 mmol/L (ref 135–145)
Total Bilirubin: 0.9 mg/dL (ref 0.3–1.2)
Total Protein: 7.6 g/dL (ref 6.5–8.1)

## 2018-01-06 LAB — TROPONIN I

## 2018-01-06 LAB — CBC WITH DIFFERENTIAL/PLATELET
Basophils Absolute: 0 10*3/uL (ref 0.0–0.1)
Basophils Relative: 0 %
Eosinophils Absolute: 0.1 10*3/uL (ref 0.0–0.7)
Eosinophils Relative: 1 %
HCT: 44.6 % (ref 39.0–52.0)
Hemoglobin: 15.1 g/dL (ref 13.0–17.0)
Lymphocytes Relative: 11 %
Lymphs Abs: 1 10*3/uL (ref 0.7–4.0)
MCH: 29.5 pg (ref 26.0–34.0)
MCHC: 33.9 g/dL (ref 30.0–36.0)
MCV: 87.3 fL (ref 78.0–100.0)
Monocytes Absolute: 0.4 10*3/uL (ref 0.1–1.0)
Monocytes Relative: 5 %
Neutro Abs: 8 10*3/uL — ABNORMAL HIGH (ref 1.7–7.7)
Neutrophils Relative %: 83 %
Platelets: 210 10*3/uL (ref 150–400)
RBC: 5.11 MIL/uL (ref 4.22–5.81)
RDW: 13.2 % (ref 11.5–15.5)
WBC: 9.6 10*3/uL (ref 4.0–10.5)

## 2018-01-06 MED ORDER — LISINOPRIL 5 MG PO TABS
5.0000 mg | ORAL_TABLET | Freq: Once | ORAL | Status: AC
  Administered 2018-01-06: 5 mg via ORAL
  Filled 2018-01-06: qty 1

## 2018-01-06 MED ORDER — METOPROLOL TARTRATE 25 MG PO TABS
25.0000 mg | ORAL_TABLET | Freq: Once | ORAL | Status: AC
  Administered 2018-01-06: 25 mg via ORAL
  Filled 2018-01-06: qty 1

## 2018-01-06 MED ORDER — HYDROCODONE-ACETAMINOPHEN 5-325 MG PO TABS: 1.0000 | ORAL_TABLET | Freq: Four times a day (QID) | ORAL | 0 refills | Status: DC | PRN

## 2018-01-06 MED ORDER — HYDROMORPHONE HCL 1 MG/ML IJ SOLN
1.0000 mg | Freq: Once | INTRAMUSCULAR | Status: AC
  Administered 2018-01-06: 1 mg via INTRAVENOUS
  Filled 2018-01-06: qty 1

## 2018-01-06 MED ORDER — ACETAMINOPHEN 325 MG PO TABS: 650.0000 mg | ORAL_TABLET | Freq: Once | ORAL | Status: DC

## 2018-01-06 MED ORDER — ONDANSETRON HCL 4 MG/2ML IJ SOLN
4.0000 mg | Freq: Once | INTRAMUSCULAR | Status: AC
  Administered 2018-01-06: 4 mg via INTRAVENOUS
  Filled 2018-01-06: qty 2

## 2018-01-06 NOTE — Discharge Instructions (Addendum)
Follow-up with your family doctor as planned next week.  Return as needed

## 2018-01-06 NOTE — ED Notes (Signed)
Pt states that he has high blood pressure in the office, lisinopril was increased from 5mg  to 10 mg 2-3 weeks ago, pt took lisinopril 5 mg at 18;00 today,

## 2018-01-06 NOTE — ED Triage Notes (Signed)
Pt c/o headache, nausea and elevated blood pressure, bp at home was 200/123

## 2018-01-07 NOTE — ED Provider Notes (Signed)
[facility] EMERGENCY DEPARTMENT Provider Note   CSN: [id number] Arrival date & time: 01/06/18  1941     History   Chief Complaint Chief Complaint  Patient presents with  . Hypertension    HPI Seth Graves is a 57 y.o. male.  Patient has a headache.  He has not taken his blood pressure medicines tonight.  Patient recently had his blood pressure medicine increased by his PCP and he will see him in a week  The history is provided by the patient. No language interpreter was used.  Illness  This is a new problem. The current episode started 12 to 24 hours ago. The problem occurs constantly. The problem has not changed since onset.Associated symptoms include headaches. Pertinent negatives include no chest pain and no abdominal pain. Nothing aggravates the symptoms. He has tried nothing for the symptoms. The treatment provided no relief.    Past Medical History:  Diagnosis Date  . Arthritis   . Coronary artery disease    NSTEMI 05/2012 s/p PTCA/DES of the mid RCA, ramus intermediate, and mid left circumflex, LVEF 55-60%  . Essential hypertension, benign   . Hypercholesteremia   . Hyperglycemia   . NSTEMI (non-ST elevated myocardial infarction) [facility])    February 2014    Patient Active Problem List   Diagnosis Date Noted  . Hyperlipidemia 06/11/2012  . Essential hypertension, benign 06/11/2012  . Hyperglycemia 06/11/2012  . Gout 06/11/2012  . Coronary atherosclerosis of native coronary artery 06/03/2012    Past Surgical History:  Procedure Laterality Date  . BACK SURGERY    . HERNIA REPAIR    . LEFT HEART CATHETERIZATION WITH CORONARY ANGIOGRAM N/A 06/04/2012   Procedure: LEFT HEART CATHETERIZATION WITH CORONARY ANGIOGRAM;  Surgeon: Peter M Martinique, MD;  Location: [facility] CATH LAB;  Service: Cardiovascular;  Laterality: N/A;  . NECK SURGERY    . PERCUTANEOUS CORONARY STENT INTERVENTION (PCI-S)  06/04/2012   Procedure: PERCUTANEOUS CORONARY STENT INTERVENTION (PCI-S);  Surgeon:  Peter M Martinique, MD;  Location: [facility] CATH LAB;  Service: Cardiovascular;;        Home Medications    Prior to Admission medications   Medication Sig Start Date End Date Taking? Authorizing Provider  aspirin EC 81 MG EC tablet Take 1 tablet (81 mg total) by mouth daily. 06/05/12  Yes Dunn, Dayna N, PA-C  clopidogrel (PLAVIX) 75 MG tablet Take 1 tablet (75 mg total) by mouth daily. 07/12/17  Yes Strader, [place] M, PA-C  cyclobenzaprine (FLEXERIL) 5 MG tablet Take 5 mg by mouth once as needed for muscle spasms.   Yes [provider]  lisinopril (PRINIVIL,ZESTRIL) 5 MG tablet Take 1 tablet (5 mg total) by mouth daily. Patient taking differently: Take 5 mg by mouth 2 (two) times daily.  07/12/17  Yes Strader, [place] M, PA-C  metoprolol tartrate (LOPRESSOR) 25 MG tablet Take 1 tablet (25 mg total) by mouth 2 (two) times daily. 07/12/17  Yes Strader, [place] M, PA-C  nitroGLYCERIN (NITROSTAT) 0.4 MG SL tablet Place 1 tablet (0.4 mg total) under the tongue every 5 (five) minutes as needed for chest pain (up to 3 doses). 01/19/15  Yes Satira Sark, MD  rosuvastatin (CRESTOR) 10 MG tablet Take 1 tablet (10 mg total) by mouth every other day. 07/12/17 01/06/18 Yes Strader, Fransisco Hertz, PA-C  HYDROcodone-acetaminophen (NORCO/VICODIN) 5-325 MG tablet Take 1 tablet by mouth every 6 (six) hours as needed for moderate pain. 01/06/18   Milton Ferguson, MD    Family History Family History  Problem Relation Age of Onset  . CAD Father        MI in his 17s  . CVA Maternal Grandmother   . Aneurysm Maternal Grandfather   . Heart attack Paternal Grandmother   . Alzheimer's disease Paternal Grandmother   . Heart attack Paternal Grandfather     Social History Social History   Tobacco Use  . Smoking status: Former Smoker    Types: Cigarettes    Start date: 04/16/1980    Last attempt to quit: 04/16/2001    Years since quitting: 16.7  . Smokeless tobacco: Never Used  Substance Use Topics  . Alcohol  use: No    Alcohol/week: 0.0 standard drinks  . Drug use: No     Allergies   Atorvastatin   Review of Systems Review of Systems  Constitutional: Negative for appetite change and fatigue.  HENT: Negative for congestion, ear discharge and sinus pressure.   Eyes: Negative for discharge.  Respiratory: Negative for cough.   Cardiovascular: Negative for chest pain.  Gastrointestinal: Negative for abdominal pain and diarrhea.  Genitourinary: Negative for frequency and hematuria.  Musculoskeletal: Negative for back pain.  Skin: Negative for rash.  Neurological: Positive for headaches. Negative for seizures.  Psychiatric/Behavioral: Negative for hallucinations.     Physical Exam Updated Vital Signs BP (!) 168/115   Pulse 76   Temp (!) 97.4 F (36.3 C) (Oral)   Resp 13   Ht 6' (1.829 m)   Wt 111.1 kg   SpO2 95%   BMI 33.23 kg/m   Physical Exam  Constitutional: He is oriented to person, place, and time. He appears well-developed.  HENT:  Head: Normocephalic.  Eyes: Conjunctivae and EOM are normal. No scleral icterus.  Neck: Neck supple. No thyromegaly present.  Cardiovascular: Normal rate and regular rhythm. Exam reveals no gallop and no friction rub.  No murmur heard. Pulmonary/Chest: No stridor. He has no wheezes. He has no rales. He exhibits no tenderness.  Abdominal: He exhibits no distension. There is no tenderness. There is no rebound.  Musculoskeletal: Normal range of motion. He exhibits no edema.  Lymphadenopathy:    He has no cervical adenopathy.  Neurological: He is oriented to person, place, and time. He exhibits normal muscle tone. Coordination normal.  Skin: No rash noted. No erythema.  Psychiatric: He has a normal mood and affect. His behavior is normal.     ED Treatments / Results  Labs (all labs ordered are listed, but only abnormal results are displayed) Labs Reviewed  CBC WITH DIFFERENTIAL/PLATELET - Abnormal; Notable for the following components:        Result Value   Neutro Abs 8.0 (*)    All other components within normal limits  COMPREHENSIVE METABOLIC PANEL - Abnormal; Notable for the following components:   Glucose, Bld 137 (*)    Creatinine, Ser 1.30 (*)    All other components within normal limits  TROPONIN I    EKG None  Radiology Ct Head Wo Contrast  Result Date: 01/06/2018 CLINICAL DATA:  57 year old male with altered mental status. EXAM: CT HEAD WITHOUT CONTRAST TECHNIQUE: Contiguous axial images were obtained from the base of the skull through the vertex without intravenous contrast. COMPARISON:  Head CT dated 08/09/2012 FINDINGS: Brain: The ventricles and sulci appropriate size for patient's age. The gray-white matter discrimination is preserved. There is no acute intracranial hemorrhage. No mass effect or midline shift. No extra-axial fluid collection. Vascular: No hyperdense vessel or unexpected calcification. Skull: Normal. Negative for fracture  or focal lesion. Sinuses/Orbits: No acute finding. Other: None IMPRESSION: No acute intracranial pathology. Electronically Signed   By: Anner Crete M.D.   On: 01/06/2018 22:34    Procedures Procedures (including critical care time)  Medications Ordered in ED Medications  HYDROmorphone (DILAUDID) injection 1 mg (1 mg Intravenous Given 01/06/18 2151)  ondansetron (ZOFRAN) injection 4 mg (4 mg Intravenous Given 01/06/18 2150)  metoprolol tartrate (LOPRESSOR) tablet 25 mg (25 mg Oral Given 01/06/18 2347)  lisinopril (PRINIVIL,ZESTRIL) tablet 5 mg (5 mg Oral Given 01/06/18 2347)     Initial Impression / Assessment and Plan / ED Course  I have reviewed the triage vital signs and the nursing notes.  Pertinent labs & imaging results that were available during my care of the patient were reviewed by me and considered in my medical decision making (see chart for details).     T scan unremarkable.  Patient symptoms improved with pain medicine.  He was given his dose of blood  pressure medicine and told that he needs to make sure he sees his doctor in the next week.  Final Clinical Impressions(s) / ED Diagnoses   Final diagnoses:  Essential hypertension    ED Discharge Orders         Ordered    HYDROcodone-acetaminophen (NORCO/VICODIN) 5-325 MG tablet  Every 6 hours PRN     01/06/18 2341           Milton Ferguson, MD 01/07/18 1620

## 2018-02-11 NOTE — Progress Notes (Signed)
Cardiology Office Note  Date: 02/12/2018   ID: Seth Graves, DOB November 17, 1960, MRN [id number]  PCP: [place]  Primary Cardiologist: Rozann Lesches, MD   Chief Complaint  Patient presents with  . Coronary Artery Disease    History of Present Illness: Seth Graves is a 57 y.o. male last seen by Ms. Strader PA-C in March.  He is here today for a routine follow-up visit.  He does not report any angina symptoms or increasing shortness of breath.  He has had some trouble with elevated blood pressure however, I reviewed interval ER notes.  Medications have been adjusted by PCP, he is now on Benicar along with Lopressor.  Was also told that there may be some concern for obstructive sleep apnea and he has pending evaluation for this through PCP.  I reviewed his cardiac medications which are outlined below and otherwise stable.  He has not used any recent nitroglycerin.  He states that he will have his lipids checked later this year with PCP and remains on Crestor.  Follow-up ischemic testing from 2017 is outlined below.  I did review his recent ECG from September.  Past Medical History:  Diagnosis Date  . Arthritis   . Coronary artery disease    NSTEMI 05/2012 s/p PTCA/DES of the mid RCA, ramus intermediate, and mid left circumflex, LVEF 55-60%  . Essential hypertension, benign   . Hypercholesteremia   . Hyperglycemia   . NSTEMI (non-ST elevated myocardial infarction) [facility])    February 2014    Past Surgical History:  Procedure Laterality Date  . BACK SURGERY    . HERNIA REPAIR    . LEFT HEART CATHETERIZATION WITH CORONARY ANGIOGRAM N/A 06/04/2012   Procedure: LEFT HEART CATHETERIZATION WITH CORONARY ANGIOGRAM;  Surgeon: Peter M Martinique, MD;  Location: [facility] CATH LAB;  Service: Cardiovascular;  Laterality: N/A;  . NECK SURGERY    . PERCUTANEOUS CORONARY STENT INTERVENTION (PCI-S)  06/04/2012   Procedure: PERCUTANEOUS CORONARY STENT INTERVENTION (PCI-S);  Surgeon:  Peter M Martinique, MD;  Location: [facility] CATH LAB;  Service: Cardiovascular;;    Current Outpatient Medications  Medication Sig Dispense Refill  . aspirin EC 81 MG EC tablet Take 1 tablet (81 mg total) by mouth daily.    . clopidogrel (PLAVIX) 75 MG tablet Take 1 tablet (75 mg total) by mouth daily. 90 tablet 4  . cyclobenzaprine (FLEXERIL) 5 MG tablet Take 5 mg by mouth once as needed for muscle spasms.    . metoprolol tartrate (LOPRESSOR) 25 MG tablet Take 1 tablet (25 mg total) by mouth 2 (two) times daily. 180 tablet 4  . nitroGLYCERIN (NITROSTAT) 0.4 MG SL tablet Place 1 tablet (0.4 mg total) under the tongue every 5 (five) minutes as needed for chest pain (up to 3 doses). 25 tablet 4  . olmesartan (BENICAR) 40 MG tablet Take 40 mg by mouth daily.  3  . rosuvastatin (CRESTOR) 10 MG tablet Take 1 tablet (10 mg total) by mouth every other day. 45 tablet 4   No current facility-administered medications for this visit.    Allergies:  Atorvastatin   Social History: The patient  reports that he quit smoking about 16 years ago. His smoking use included cigarettes. He started smoking about 37 years ago. He has never used smokeless tobacco. He reports that he does not drink alcohol or use drugs.   ROS:  Please see the history of present illness. Otherwise, complete review of systems is positive for none.  All other systems are reviewed and negative.   Physical Exam: VS:  BP (!) 150/90 (BP Location: Left Arm)   Pulse 79   Ht 6' (1.829 m)   Wt 247 lb (112 kg)   SpO2 97%   BMI 33.50 kg/m , BMI Body mass index is 33.5 kg/m.  Wt Readings from Last 3 Encounters:  02/12/18 247 lb (112 kg)  01/06/18 245 lb (111.1 kg)  09/01/17 245 lb (111.1 kg)    General: Patient appears comfortable at rest. HEENT: Conjunctiva and lids normal, oropharynx clear. Neck: Supple, no elevated JVP or carotid bruits, no thyromegaly. Lungs: Clear to auscultation, nonlabored breathing at rest. Cardiac: Regular rate and  rhythm, no S3 or significant systolic murmur. Abdomen: Soft, nontender, bowel sounds present. Extremities: No pitting edema, distal pulses 2+. Skin: Warm and dry. Musculoskeletal: No kyphosis. Neuropsychiatric: Alert and oriented x3, affect grossly appropriate.  ECG: I personally reviewed the tracing from 01/06/2018 which showed sinus rhythm with nonspecific T wave changes.  Recent Labwork: 01/06/2018: ALT 31; AST 26; BUN 12; Creatinine, Ser 1.30; Hemoglobin 15.1; Platelets 210; Potassium 3.8; Sodium 140     Component Value Date/Time   CHOL 186 04/27/2016 0958   TRIG 154 (H) 04/27/2016 0958   HDL 48 04/27/2016 0958   CHOLHDL 3.9 04/27/2016 0958   VLDL 31 (H) 04/27/2016 0958   LDLCALC 107 (H) 04/27/2016 0958    Other Studies Reviewed Today:  [facility] Myoview 08/05/2015:  There was no ST segment deviation noted during stress.  Findings consistent with prior mild basal inferoseptal myocardial infarction with very mild peri-infarct ischemia.  This is a low risk study.  The left ventricular ejection fraction is normal (55-65%).  Assessment and Plan:  1.  Multivessel CAD status post DES to the RCA, ramus intermedius, and LAD in 2014.  He reports no active angina symptoms at this time and underwent ischemic testing approximately 2 years ago.  Continue antiplatelet regimen along with statin.  Recent ECG reviewed.  2.  Mixed hyperlipidemia, tolerating Crestor.  Recommended follow-up lipid panel with PCP.  3.  Essential hypertension, now on Lopressor and Benicar with follow-up per PCP.  4.  At risk for obstructive sleep apnea.  He states that he was observed to have some apneic episodes while sleeping at ER visit and referred to PCP to arrange sleep testing.  Current medicines were reviewed with the patient today.  Disposition: Follow-up in 6 months.  Signed, Satira Sark, MD, [facility] 02/12/2018 10:15 AM    [facility] [place]. [address]  [place],  [address] Phone: [phone]; Fax: [phone]

## 2018-02-12 ENCOUNTER — Ambulatory Visit (INDEPENDENT_AMBULATORY_CARE_PROVIDER_SITE_OTHER): Payer: Managed Care, Other (non HMO) | Admitting: Cardiology

## 2018-02-12 ENCOUNTER — Encounter: Payer: Self-pay | Admitting: Cardiology

## 2018-02-12 ENCOUNTER — Ambulatory Visit: Payer: Managed Care, Other (non HMO) | Admitting: Cardiology

## 2018-02-12 VITALS — BP 150/90 | HR 79 | Ht 72.0 in | Wt 247.0 lb

## 2018-02-12 DIAGNOSIS — E782 Mixed hyperlipidemia: Secondary | ICD-10-CM | POA: Diagnosis not present

## 2018-02-12 DIAGNOSIS — Z9189 Other specified personal risk factors, not elsewhere classified: Secondary | ICD-10-CM

## 2018-02-12 DIAGNOSIS — I25119 Atherosclerotic heart disease of native coronary artery with unspecified angina pectoris: Secondary | ICD-10-CM

## 2018-02-12 DIAGNOSIS — I10 Essential (primary) hypertension: Secondary | ICD-10-CM | POA: Diagnosis not present

## 2018-02-12 NOTE — Patient Instructions (Signed)
Medication Instructions:  Your physician recommends that you continue on your current medications as directed. Please refer to the Current Medication list given to you today.  If you need a refill on your cardiac medications before your next appointment, please call your pharmacy.   Lab work: none If you have labs (blood work) drawn today and your tests are completely normal, you will receive your results only by: . MyChart Message (if you have MyChart) OR . A paper copy in the mail If you have any lab test that is abnormal or we need to change your treatment, we will call you to review the results.  Testing/Procedures: none  Follow-Up: At [facility], you and your health needs are our priority.  As part of our continuing mission to provide you with exceptional heart care, we have created designated Provider Care Teams.  These Care Teams include your primary Cardiologist (physician) and Advanced Practice Providers (APPs -  Physician Assistants and Nurse Practitioners) who all work together to provide you with the care you need, when you need it. You will need a follow up appointment in 6 months.  Please call our office 2 months in advance to schedule this appointment.  You may see Samuel McDowell, MD or one of the following Advanced Practice Providers on your designated Care Team:   Brittany Strader, PA-C ([place] Office) . Michele Lenze, PA-C ( Office)  Any Other Special Instructions Will Be Listed Below (If Applicable). NONE   

## 2018-05-14 ENCOUNTER — Other Ambulatory Visit: Payer: Self-pay

## 2018-05-14 MED ORDER — NITROGLYCERIN 0.4 MG SL SUBL: 0.4000 mg | SUBLINGUAL_TABLET | SUBLINGUAL | 4 refills | Status: DC | PRN

## 2018-07-15 ENCOUNTER — Other Ambulatory Visit: Payer: Self-pay | Admitting: Student

## 2018-08-26 ENCOUNTER — Telehealth: Payer: Self-pay | Admitting: Cardiology

## 2018-08-26 NOTE — Telephone Encounter (Signed)
Virtual Visit Pre-Appointment Phone Call  "(Name), I am calling you today to discuss your upcoming appointment. We are currently trying to limit exposure to the virus that causes COVID-19 by seeing patients at home rather than in the office."  1. "What is the BEST phone number to call the day of the visit?" - include this in appointment notes  2. Do you have or have access to (through a family member/friend) a smartphone with video capability that we can use for your visit?" a. If yes - list this number in appt notes as cell (if different from BEST phone #) and list the appointment type as a VIDEO visit in appointment notes b. If no - list the appointment type as a PHONE visit in appointment notes  3. Confirm consent - "In the setting of the current Covid19 crisis, you are scheduled for a (phone or video) visit with your provider on (date) at (time).  Just as we do with many in-office visits, in order for you to participate in this visit, we must obtain consent.  If you'd like, I can send this to your mychart (if signed up) or email for you to review.  Otherwise, I can obtain your verbal consent now.  All virtual visits are billed to your insurance company just like a normal visit would be.  By agreeing to a virtual visit, we'd like you to understand that the technology does not allow for your provider to perform an examination, and thus may limit your provider's ability to fully assess your condition. If your provider identifies any concerns that need to be evaluated in person, we will make arrangements to do so.  Finally, though the technology is pretty good, we cannot assure that it will always work on either your or our end, and in the setting of a video visit, we may have to convert it to a phone-only visit.  In either situation, we cannot ensure that we have a secure connection.  Are you willing to proceed?" STAFF: Did the patient verbally acknowledge consent to telehealth visit? Document  YES/NO here: Yes  4. Advise patient to be prepared - "Two hours prior to your appointment, go ahead and check your blood pressure, pulse, oxygen saturation, and your weight (if you have the equipment to check those) and write them all down. When your visit starts, your provider will ask you for this information. If you have an Apple Watch or Kardia device, please plan to have heart rate information ready on the day of your appointment. Please have a pen and paper handy nearby the day of the visit as well."  5. Give patient instructions for MyChart download to smartphone OR [email or web address] as below if video visit (depending on what platform provider is using)  6. Inform patient they will receive a phone call 15 minutes prior to their appointment time (may be from unknown caller ID) so they should be prepared to answer    TELEPHONE CALL NOTE  Seth Graves has been deemed a candidate for a follow-up tele-health visit to limit community exposure during the Covid-19 pandemic. I spoke with the patient via phone to ensure availability of phone/video source, confirm preferred email & phone number, and discuss instructions and expectations.  I reminded Seth Graves to be prepared with any vital sign and/or heart rhythm information that could potentially be obtained via home monitoring, at the time of his visit. I reminded Seth Graves to expect a phone call prior to  his visit.  Seth Graves 08/26/2018 12:06 PM

## 2018-09-03 NOTE — Progress Notes (Signed)
Virtual Visit via Telephone Note   This visit type was conducted due to national recommendations for restrictions regarding the COVID-19 Pandemic (e.g. social distancing) in an effort to limit this patient's exposure and mitigate transmission in our community.  Due to his co-morbid illnesses, this patient is at least at moderate risk for complications without adequate follow up.  This format is felt to be most appropriate for this patient at this time.  The patient did not have access to video technology/had technical difficulties with video requiring transitioning to audio format only (telephone).  All issues noted in this document were discussed and addressed.  No physical exam could be performed with this format.  Please refer to the patient's chart for his  consent to telehealth for [facility].   Date:  09/04/2018   ID:  Seth Graves, DOB 04-27-1960, MRN [id number]  Patient Location: Home Provider Location: Office  [facility]P:  [facility]  Cardiologist:  Rozann Lesches, MD  Evaluation Performed:  Follow-Up Visit  Chief Complaint:   Cardiac follow-up  History of Present Illness:    Seth Graves is a 58 y.o. male last seen in October 2019.  We communicated by video conferencing today.  He tells me that he has done well since last encounter, no angina symptoms or nitroglycerin use. Still driving a truck locally.  He reports NYHA class II dyspnea, no palpitations or syncope.  I reviewed his medications which are outlined below.  He reports compliance and no intolerances.  He continues to follow with [facility].  The patient does not have symptoms concerning for COVID-19 infection (fever, chills, cough, or new shortness of breath).    Past Medical History:  Diagnosis Date  . Arthritis   . Coronary artery disease    NSTEMI 05/2012 s/p PTCA/DES of the mid RCA, ramus intermediate, and mid left circumflex, LVEF 55-60%  . Essential hypertension, benign   .  Hypercholesteremia   . Hyperglycemia   . NSTEMI (non-ST elevated myocardial infarction) [facility])    February 2014   Past Surgical History:  Procedure Laterality Date  . BACK SURGERY    . HERNIA REPAIR    . LEFT HEART CATHETERIZATION WITH CORONARY ANGIOGRAM N/A 06/04/2012   Procedure: LEFT HEART CATHETERIZATION WITH CORONARY ANGIOGRAM;  Surgeon: Peter M Martinique, MD;  Location: [facility] CATH LAB;  Service: Cardiovascular;  Laterality: N/A;  . NECK SURGERY    . PERCUTANEOUS CORONARY STENT INTERVENTION (PCI-S)  06/04/2012   Procedure: PERCUTANEOUS CORONARY STENT INTERVENTION (PCI-S);  Surgeon: Peter M Martinique, MD;  Location: [facility] CATH LAB;  Service: Cardiovascular;;     Current Meds  Medication Sig  . aspirin EC 81 MG EC tablet Take 1 tablet (81 mg total) by mouth daily.  . clopidogrel (PLAVIX) 75 MG tablet Take 1 tablet by mouth once daily  . cyclobenzaprine (FLEXERIL) 5 MG tablet Take 5 mg by mouth once as needed for muscle spasms.  Marland Kitchen escitalopram (LEXAPRO) 10 MG tablet Take 1 tablet by mouth daily.  . metoprolol tartrate (LOPRESSOR) 25 MG tablet Take 1 tablet (25 mg total) by mouth 2 (two) times daily.  . nitroGLYCERIN (NITROSTAT) 0.4 MG SL tablet Place 1 tablet (0.4 mg total) under the tongue every 5 (five) minutes as needed for chest pain (up to 3 doses).  . olmesartan (BENICAR) 40 MG tablet Take 40 mg by mouth daily.  . rosuvastatin (CRESTOR) 10 MG tablet TAKE 1 TABLET BY MOUTH EVERY OTHER DAY     Allergies:  Atorvastatin   Social History   Tobacco Use  . Smoking status: Former Smoker    Types: Cigarettes    Start date: 04/16/1980    Last attempt to quit: 04/16/2001    Years since quitting: 17.3  . Smokeless tobacco: Never Used  Substance Use Topics  . Alcohol use: No    Alcohol/week: 0.0 standard drinks  . Drug use: No     Family Hx: The patient's family history includes Alzheimer's disease in his paternal grandmother; Aneurysm in his maternal grandfather; CAD in his father; CVA in his  maternal grandmother; Heart attack in his paternal grandfather and paternal grandmother.  ROS:   Please see the history of present illness.    All other systems reviewed and are negative.   Prior CV studies:   The following studies were reviewed today:  Lexiscan Myoview 08/05/2015:  There was no ST segment deviation noted during stress.  Findings consistent with prior mild basal inferoseptal myocardial infarction with very mild peri-infarct ischemia.  This is a low risk study.  The left ventricular ejection fraction is normal (55-65%).  Labs/Other Tests and Data Reviewed:    EKG:  An ECG dated 01/06/2018 was personally reviewed today and demonstrated:  Sinus rhythm with nonspecific T wave changes.  Recent Labs: 01/06/2018: ALT 31; BUN 12; Creatinine, Ser 1.30; Hemoglobin 15.1; Platelets 210; Potassium 3.8; Sodium 140   Recent Lipid Panel Lab Results  Component Value Date/Time   CHOL 186 04/27/2016 09:58 AM   TRIG 154 (H) 04/27/2016 09:58 AM   HDL 48 04/27/2016 09:58 AM   CHOLHDL 3.9 04/27/2016 09:58 AM   LDLCALC 107 (H) 04/27/2016 09:58 AM    Wt Readings from Last 3 Encounters:  09/04/18 247 lb (112 kg)  02/12/18 247 lb (112 kg)  01/06/18 245 lb (111.1 kg)     Objective:    Vital Signs:  BP 112/78   Ht 6' (1.829 m)   Wt 247 lb (112 kg)   BMI 33.50 kg/m    Blood pressure recorded today was from a recent office visit with PCP. General: Patient appears comfortable at rest, seated and walking in his home. HEENT: Conjunctiva and lids normal. Lungs: Patient spoke in full sentences, not short of breath.  No audible wheezing. Skin: Normal appearance of color and turgor. Neuropsychiatric: Gaze conjugate, speech pattern normal, moves all extremities and observed to walk.  Affect appropriate.  ASSESSMENT & PLAN:    1.  Multivessel CAD status post DES to the RCA, ramus intermedius, and LAD in 2014.  Last ischemic testing was in 2017, low risk.  He reports no active angina  on medical therapy and we will continue with observation for now.  2.  Mixed hyperlipidemia on Crestor.  Keep follow-up with PCP.  3.  Essential hypertension, no changes made to present regimen.  COVID-19 Education: The signs and symptoms of COVID-19 were discussed with the patient and how to seek care for testing (follow up with PCP or arrange E-visit).  The importance of social distancing was discussed today.  Time:   Today, I have spent 7 minutes with the patient with telehealth technology discussing the above problems.     Medication Adjustments/Labs and Tests Ordered: Current medicines are reviewed at length with the patient today.  Concerns regarding medicines are outlined above.   Tests Ordered: No orders of the defined types were placed in this encounter.   Medication Changes: No orders of the defined types were placed in this encounter.   Disposition:  Follow up 6 months in the [place] office.  Signed, Rozann Lesches, MD  09/04/2018 2:10 PM    [place]

## 2018-09-04 ENCOUNTER — Telehealth (INDEPENDENT_AMBULATORY_CARE_PROVIDER_SITE_OTHER): Payer: Managed Care, Other (non HMO) | Admitting: Cardiology

## 2018-09-04 ENCOUNTER — Encounter: Payer: Self-pay | Admitting: Cardiology

## 2018-09-04 ENCOUNTER — Other Ambulatory Visit: Payer: Self-pay

## 2018-09-04 VITALS — BP 112/78 | Ht 72.0 in | Wt 247.0 lb

## 2018-09-04 DIAGNOSIS — Z7189 Other specified counseling: Secondary | ICD-10-CM | POA: Diagnosis not present

## 2018-09-04 DIAGNOSIS — I10 Essential (primary) hypertension: Secondary | ICD-10-CM

## 2018-09-04 DIAGNOSIS — I25119 Atherosclerotic heart disease of native coronary artery with unspecified angina pectoris: Secondary | ICD-10-CM | POA: Diagnosis not present

## 2018-09-04 DIAGNOSIS — E782 Mixed hyperlipidemia: Secondary | ICD-10-CM

## 2018-09-04 NOTE — Patient Instructions (Addendum)

## 2018-12-29 ENCOUNTER — Encounter: Payer: Self-pay | Admitting: Podiatry

## 2018-12-29 ENCOUNTER — Other Ambulatory Visit: Payer: Self-pay

## 2018-12-29 ENCOUNTER — Ambulatory Visit: Payer: Managed Care, Other (non HMO) | Admitting: Podiatry

## 2018-12-29 VITALS — BP 135/83 | HR 75

## 2018-12-29 DIAGNOSIS — L600 Ingrowing nail: Secondary | ICD-10-CM

## 2018-12-29 NOTE — Patient Instructions (Signed)

## 2018-12-31 NOTE — Progress Notes (Signed)
   HPI: 58 y.o. male presenting today as a new patient with a chief complaint of an ingrown nail of the lateral border of the left hallux that appeared two months ago. He states he was evaluated and treated by his PCP and was prescribed two courses of antibiotics and received an injection for treatment. He reports tenderness, redness and swelling that has since resolved. He denies modifying factors at this time. Patient is here for further evaluation and treatment.   Past Medical History:  Diagnosis Date  . Arthritis   . Coronary artery disease    NSTEMI 05/2012 s/p PTCA/DES of the mid RCA, ramus intermediate, and mid left circumflex, LVEF 55-60%  . Essential hypertension, benign   . Hypercholesteremia   . Hyperglycemia   . NSTEMI (non-ST elevated myocardial infarction) [facility])    February 2014     Physical Exam: General: The patient is alert and oriented x3 in no acute distress.  Dermatology: Skin is warm, dry and supple bilateral lower extremities. Negative for open lesions or macerations.  Vascular: Palpable pedal pulses bilaterally. No edema or erythema noted. Capillary refill within normal limits.  Neurological: Epicritic and protective threshold grossly intact bilaterally.   Musculoskeletal Exam: Range of motion within normal limits to all pedal and ankle joints bilateral. Muscle strength 5/5 in all groups bilateral.   Assessment: 1. Ingrown nail lateral border left hallux - resolved    Plan of Care:  1. Patient evaluated.   2. Light debridement of the nail performed with a nail nipper.  3. Recommended good shoe gear.  4. Return to clinic as needed.      Edrick Kins, DPM [facility]  Dr. Edrick Kins, DPM    [address], [place] [address]                Office [phone]  Fax [phone]

## 2019-01-16 ENCOUNTER — Telehealth: Payer: Self-pay | Admitting: *Deleted

## 2019-01-16 NOTE — Telephone Encounter (Signed)
The patient verbally consented for a telehealth phone visit with [facility] and understands that his/her insurance company will be billed for the encounter.   Will try to have vitals & medications ready.

## 2019-01-19 ENCOUNTER — Other Ambulatory Visit: Payer: Self-pay | Admitting: Student

## 2019-02-03 ENCOUNTER — Encounter: Payer: Self-pay | Admitting: Cardiology

## 2019-02-03 ENCOUNTER — Telehealth (INDEPENDENT_AMBULATORY_CARE_PROVIDER_SITE_OTHER): Payer: Managed Care, Other (non HMO) | Admitting: Cardiology

## 2019-02-03 VITALS — BP 121/80 | HR 70 | Ht 72.0 in | Wt 257.0 lb

## 2019-02-03 DIAGNOSIS — I25119 Atherosclerotic heart disease of native coronary artery with unspecified angina pectoris: Secondary | ICD-10-CM

## 2019-02-03 DIAGNOSIS — E782 Mixed hyperlipidemia: Secondary | ICD-10-CM | POA: Diagnosis not present

## 2019-02-03 DIAGNOSIS — I10 Essential (primary) hypertension: Secondary | ICD-10-CM

## 2019-02-03 NOTE — Progress Notes (Signed)
Virtual Visit via Telephone Note   This visit type was conducted due to national recommendations for restrictions regarding the COVID-19 Pandemic (e.g. social distancing) in an effort to limit this patient's exposure and mitigate transmission in our community.  Due to his co-morbid illnesses, this patient is at least at moderate risk for complications without adequate follow up.  This format is felt to be most appropriate for this patient at this time.  The patient did not have access to video technology/had technical difficulties with video requiring transitioning to audio format only (telephone).  All issues noted in this document were discussed and addressed.  No physical exam could be performed with this format.  Please refer to the patient's chart for his  consent to telehealth for [facility].   Date:  02/03/2019   ID:  Seth Graves, DOB 07-Jun-1960, MRN [id number]  Patient Location: Home Provider Location: Office  [facility]P:  [facility]  Cardiologist:  Rozann Lesches, MD Electrophysiologist:  None   Evaluation Performed:  Follow-Up Visit  Chief Complaint:   Cardiac follow-up  History of Present Illness:    Seth Graves is a 58 y.o. male last assessed via telehealth encounter in May.  We spoke by phone today.  He tells me that a few weeks ago he began experiencing back pain, sometimes with radiation to the left arm and on one occasion with discomfort in his lower jaw.  This in some ways reminded him of prior angina, but not completely.  He started seeing a chiropractor, and with adjustments the symptoms have improved.  He is still concerned because the jaw pain reminded him of prior angina.  He did not try nitroglycerin.  He continues to work as a Engineer, drilling.  He reports compliance with his medications which are outlined below.  He is fairly sedentary otherwise and has gained weight but is trying to work on his diet.  The patient does not have symptoms  concerning for COVID-19 infection (fever, chills, cough, or new shortness of breath).    Past Medical History:  Diagnosis Date  . Arthritis   . Coronary artery disease    NSTEMI 05/2012 s/p PTCA/DES of the mid RCA, ramus intermediate, and mid left circumflex, LVEF 55-60%  . Essential hypertension, benign   . Hypercholesteremia   . Hyperglycemia   . NSTEMI (non-ST elevated myocardial infarction) [facility])    February 2014   Past Surgical History:  Procedure Laterality Date  . BACK SURGERY    . HERNIA REPAIR    . LEFT HEART CATHETERIZATION WITH CORONARY ANGIOGRAM N/A 06/04/2012   Procedure: LEFT HEART CATHETERIZATION WITH CORONARY ANGIOGRAM;  Surgeon: Peter M Martinique, MD;  Location: [facility] CATH LAB;  Service: Cardiovascular;  Laterality: N/A;  . NECK SURGERY    . PERCUTANEOUS CORONARY STENT INTERVENTION (PCI-S)  06/04/2012   Procedure: PERCUTANEOUS CORONARY STENT INTERVENTION (PCI-S);  Surgeon: Peter M Martinique, MD;  Location: [facility] CATH LAB;  Service: Cardiovascular;;     Current Meds  Medication Sig  . aspirin EC 81 MG EC tablet Take 1 tablet (81 mg total) by mouth daily.  . clopidogrel (PLAVIX) 75 MG tablet Take 1 tablet by mouth once daily  . metoprolol tartrate (LOPRESSOR) 25 MG tablet Take 1 tablet by mouth twice daily  . nitroGLYCERIN (NITROSTAT) 0.4 MG SL tablet Place 1 tablet (0.4 mg total) under the tongue every 5 (five) minutes as needed for chest pain (up to 3 doses).  . olmesartan (BENICAR) 40 MG  tablet Take 40 mg by mouth daily.  . rosuvastatin (CRESTOR) 10 MG tablet TAKE 1 TABLET BY MOUTH EVERY OTHER DAY     Allergies:   Atorvastatin   Social History   Tobacco Use  . Smoking status: Former Smoker    Types: Cigarettes    Start date: 04/16/1980    Quit date: 04/16/2001    Years since quitting: 17.8  . Smokeless tobacco: Never Used  Substance Use Topics  . Alcohol use: No    Alcohol/week: 0.0 standard drinks  . Drug use: No     Family Hx: The patient's family history  includes Alzheimer's disease in his paternal grandmother; Aneurysm in his maternal grandfather; CAD in his father; CVA in his maternal grandmother; Heart attack in his paternal grandfather and paternal grandmother.  ROS:   Please see the history of present illness. All other systems reviewed and are negative.   Prior CV studies:   The following studies were reviewed today:  Lexiscan Myoview 08/05/2015:  There was no ST segment deviation noted during stress.  Findings consistent with prior mild basal inferoseptal myocardial infarction with very mild peri-infarct ischemia.  This is a low risk study.  The left ventricular ejection fraction is normal (55-65%).  Labs/Other Tests and Data Reviewed:    EKG:  An ECG dated 01/06/2018 was personally reviewed today and demonstrated:  Sinus rhythm with nonspecific T wave changes.  Recent Labs:  01/06/2018: ALT 31; BUN 12; Creatinine, Ser 1.30; Hemoglobin 15.1; Platelets 210; Potassium 3.8; Sodium 140   Wt Readings from Last 3 Encounters:  02/03/19 257 lb (116.6 kg)  09/04/18 247 lb (112 kg)  02/12/18 247 lb (112 kg)     Objective:    Vital Signs:  BP 121/80   Pulse 70   Ht 6' (1.829 m)   Wt 257 lb (116.6 kg)   BMI 34.86 kg/m    Patient spoke in full sentences, not short of breath. No audible wheezing or coughing. Speech pattern normal.  ASSESSMENT & PLAN:    1.  Multivessel CAD status post DES to the RCA, ramus intermedius, and LAD in 2014.  He reports interval symptoms as discussed above, somewhat atypical overall but did remind him to some degree of previous angina.  Plan will be to obtain a follow-up Lexiscan Myoview to reassess ischemic burden.  Continue medical therapy for now.  2.  Mixed hyperlipidemia on Crestor.  3.  Essential hypertension, reported blood pressure is well controlled today.  COVID-19 Education: The signs and symptoms of COVID-19 were discussed with the patient and how to seek care for testing (follow  up with PCP or arrange E-visit).  The importance of social distancing was discussed today.  Time:   Today, I have spent 8 minutes with the patient with telehealth technology discussing the above problems.     Medication Adjustments/Labs and Tests Ordered: Current medicines are reviewed at length with the patient today.  Concerns regarding medicines are outlined above.   Tests Ordered: Orders Placed This Encounter  Procedures  . NM Myocar Multi W/Spect W/Wall Motion / EF    Medication Changes: No orders of the defined types were placed in this encounter.   Follow Up:  Call with test results.   Signed, Rozann Lesches, MD  02/03/2019 10:04 AM    [place]

## 2019-02-03 NOTE — Patient Instructions (Signed)
Medication Instructions:  Your physician recommends that you continue on your current medications as directed. Please refer to the Current Medication list given to you today.  *If you need a refill on your cardiac medications before your next appointment, please call your pharmacy*  Lab Work: None today If you have labs (blood work) drawn today and your tests are completely normal, you will receive your results only by: Marland Kitchen MyChart Message (if you have MyChart) OR . A paper copy in the mail If you have any lab test that is abnormal or we need to change your treatment, we will call you to review the results.  Testing/Procedures: Your physician has requested that you have a lexiscan myoview. For further information please visit [email or web address]. Please follow instruction sheet, as given.    Follow-Up: At [facility], you and your health needs are our priority.  As part of our continuing mission to provide you with exceptional heart care, we have created designated Provider Care Teams.  These Care Teams include your primary Cardiologist (physician) and Advanced Practice Providers (APPs -  Physician Assistants and Nurse Practitioners) who all work together to provide you with the care you need, when you need it.  Your next appointment:   To be determined after lexiscan  The format for your next appointment:   We will call you with results      Thank you for choosing [facility] !

## 2019-02-20 ENCOUNTER — Encounter (HOSPITAL_COMMUNITY)
Admission: RE | Admit: 2019-02-20 | Discharge: 2019-02-20 | Disposition: A | Discharge status: Home / Self Care | Payer: Managed Care, Other (non HMO) | Source: Ambulatory Visit | Attending: Cardiology | Admitting: Cardiology

## 2019-02-20 ENCOUNTER — Other Ambulatory Visit: Payer: Self-pay

## 2019-02-20 ENCOUNTER — Encounter (HOSPITAL_BASED_OUTPATIENT_CLINIC_OR_DEPARTMENT_OTHER)
Admission: RE | Admit: 2019-02-20 | Discharge: 2019-02-20 | Disposition: A | Discharge status: Home / Self Care | Payer: Managed Care, Other (non HMO) | Source: Ambulatory Visit | Attending: Cardiology | Admitting: Cardiology

## 2019-02-20 DIAGNOSIS — I25119 Atherosclerotic heart disease of native coronary artery with unspecified angina pectoris: Secondary | ICD-10-CM | POA: Insufficient documentation

## 2019-02-20 LAB — NM MYOCAR MULTI W/SPECT W/WALL MOTION / EF
LV dias vol: 115 mL (ref 62–150)
LV sys vol: 56 mL
Peak HR: 90 {beats}/min
RATE: 0.32
Rest HR: 55 {beats}/min
SDS: 2
SRS: 0
SSS: 2
TID: 1.15

## 2019-02-20 MED ORDER — TECHNETIUM TC 99M TETROFOSMIN IV KIT
10.0000 | PACK | Freq: Once | INTRAVENOUS | Status: AC | PRN
  Administered 2019-02-20: 10:00:00 11 via INTRAVENOUS

## 2019-02-20 MED ORDER — SODIUM CHLORIDE FLUSH 0.9 % IV SOLN
INTRAVENOUS | Status: AC
  Administered 2019-02-20: 10 mL via INTRAVENOUS
  Filled 2019-02-20: qty 10

## 2019-02-20 MED ORDER — TECHNETIUM TC 99M TETROFOSMIN IV KIT
30.0000 | PACK | Freq: Once | INTRAVENOUS | Status: AC | PRN
  Administered 2019-02-20: 32 via INTRAVENOUS

## 2019-02-20 MED ORDER — REGADENOSON 0.4 MG/5ML IV SOLN
INTRAVENOUS | Status: AC
  Administered 2019-02-20: 5 mL via INTRAVENOUS
  Filled 2019-02-20: qty 5

## 2019-03-19 ENCOUNTER — Other Ambulatory Visit: Payer: Self-pay | Admitting: Student

## 2019-03-19 NOTE — Telephone Encounter (Signed)
This is a [place] pt.  °

## 2019-07-21 ENCOUNTER — Other Ambulatory Visit: Payer: Self-pay | Admitting: Student

## 2019-07-22 NOTE — Telephone Encounter (Signed)
This is a [place] pt.  °

## 2019-11-01 IMAGING — CT CT HEAD W/O CM
3 series · 16 of 47 positions shown, 19 images · non-contrast
Comparison: Head CT dated 08/09/2012

CLINICAL DATA: 57-year-old male with altered mental status.

EXAM:
CT HEAD WITHOUT CONTRAST
TECHNIQUE: Contiguous axial images were obtained from the base of the skull
through the vertex without intravenous contrast.

[Series 2: head trauma wo · axial · 0.49mm/px · z∈[+167,+292]mm · 10 of 31 slices shown, 13 images]
[im 3/31  brain]
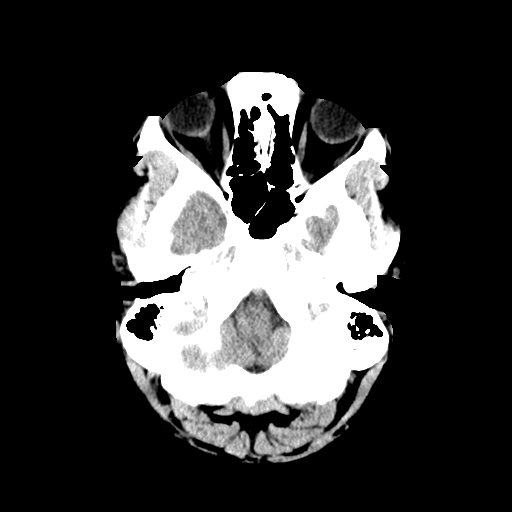
[im 3/31  bone]
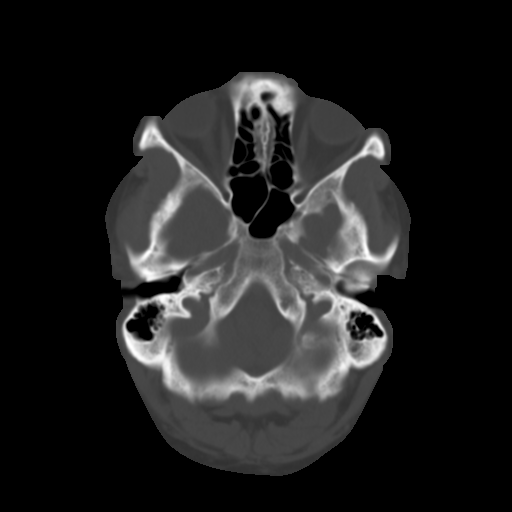
[im 6/31  brain]
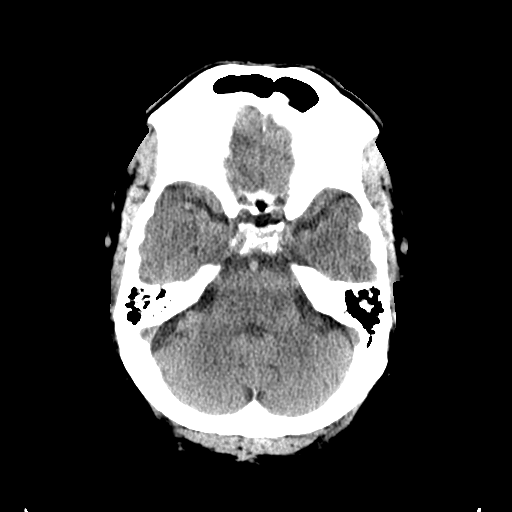
[im 9/31  brain]
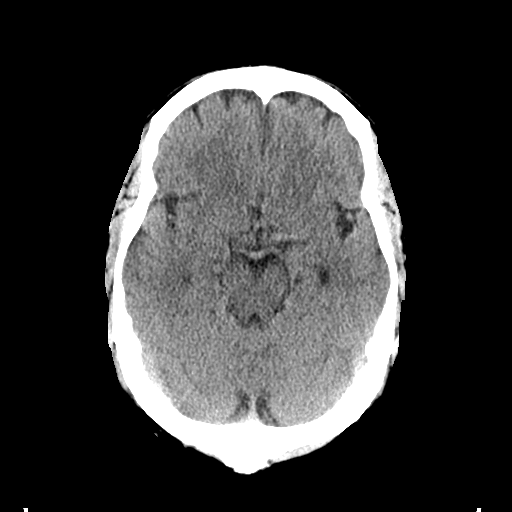
[im 11/31  brain]
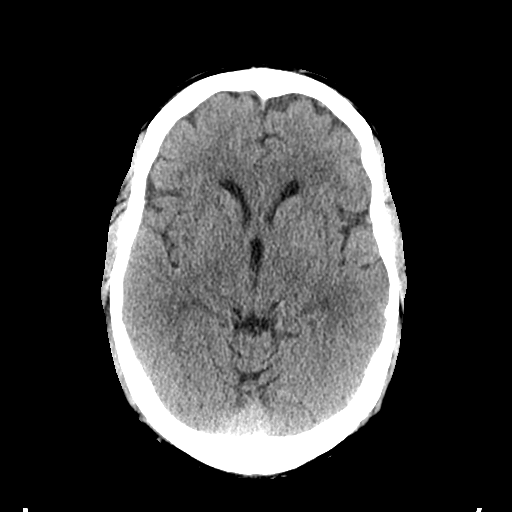
[im 14/31  brain]
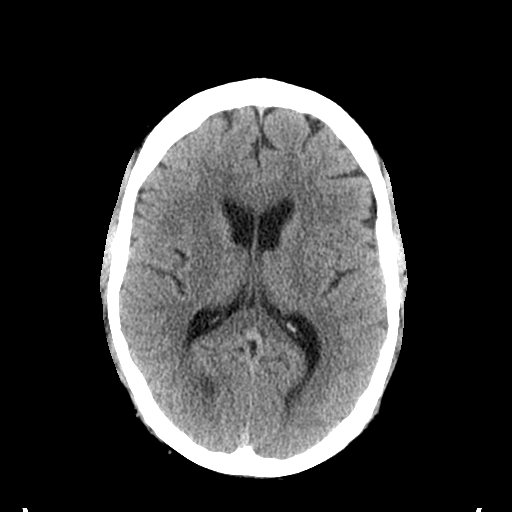
[im 14/31  bone]
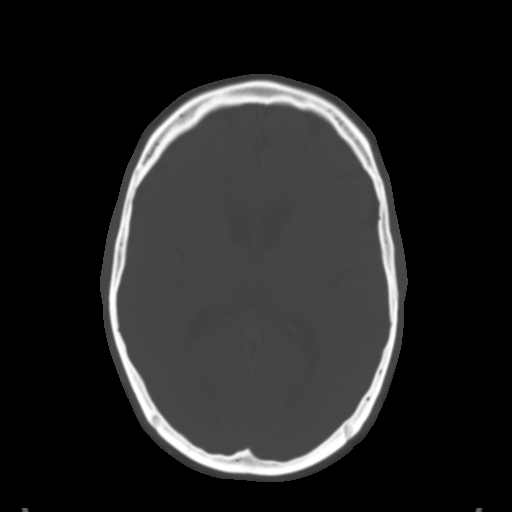
[im 17/31  brain]
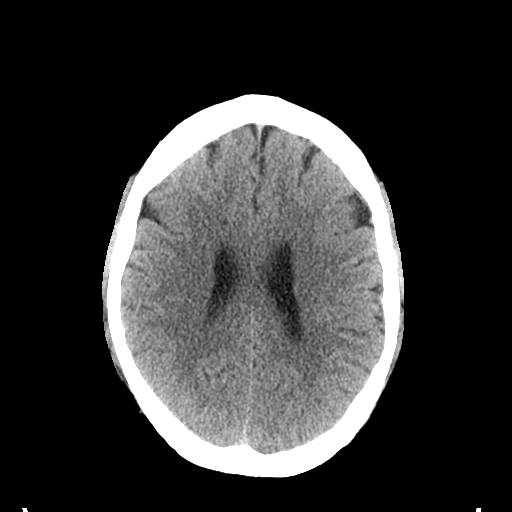
[im 20/31  brain]
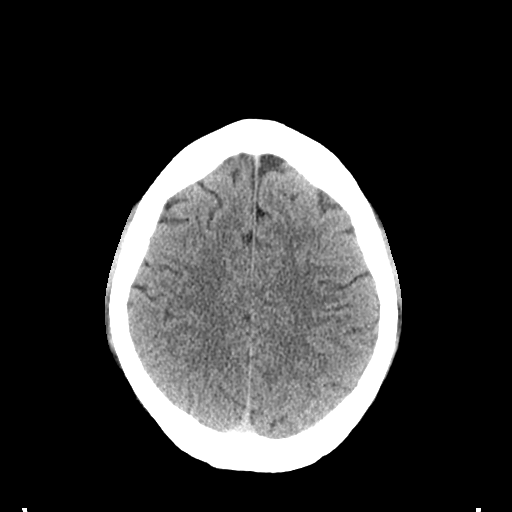
[im 23/31  brain]
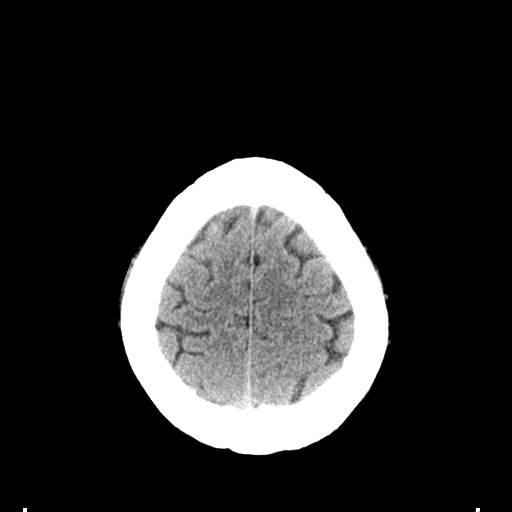
[im 25/31  brain]
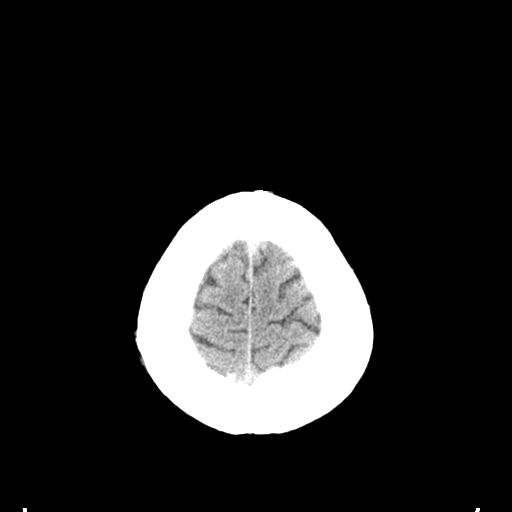
[im 25/31  bone]
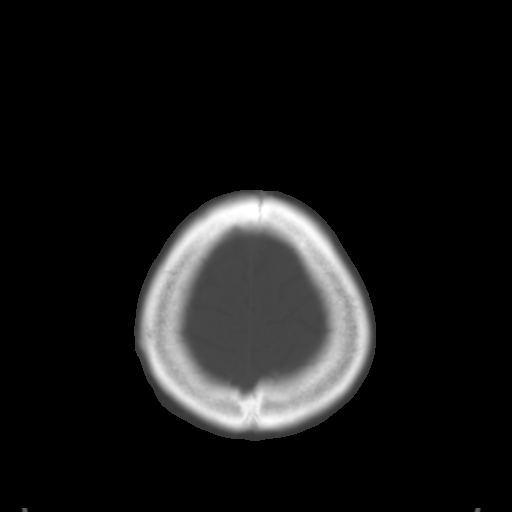
[im 28/31  brain]
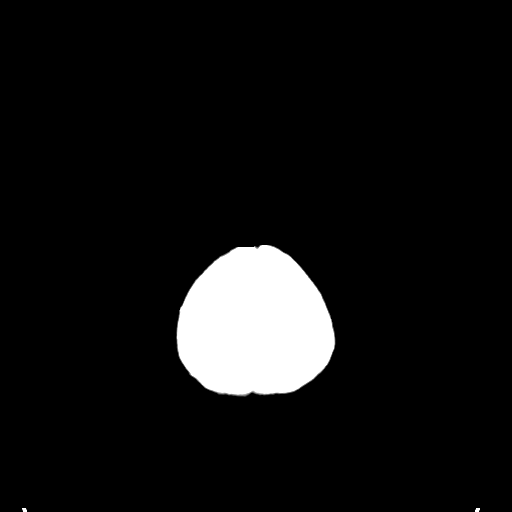

[Series 4: coronal soft tissue · coronal · 0.34mm/px · 3 of 71 slices shown]
[im 24/71  brain]
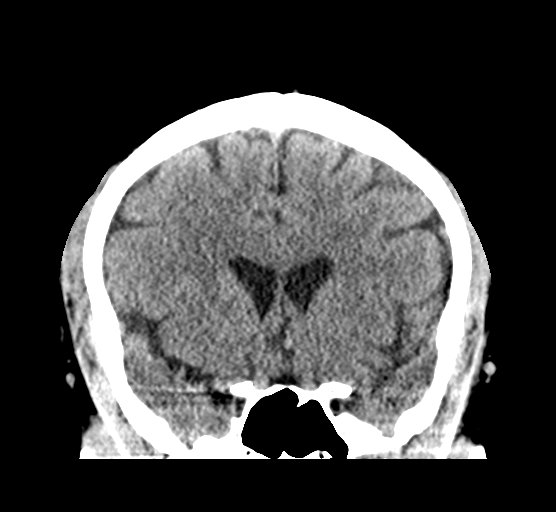
[im 32/71  brain]
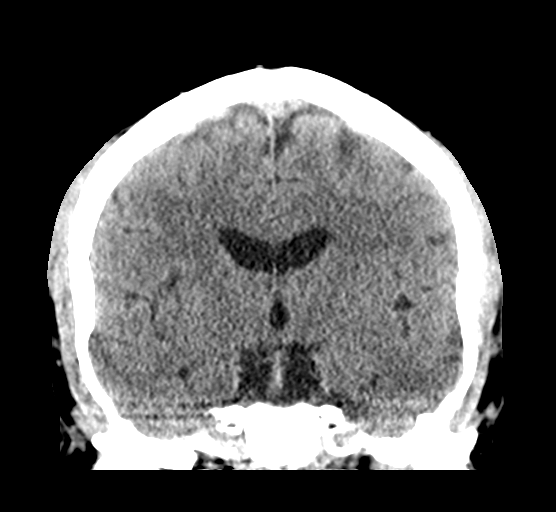
[im 39/71  brain]
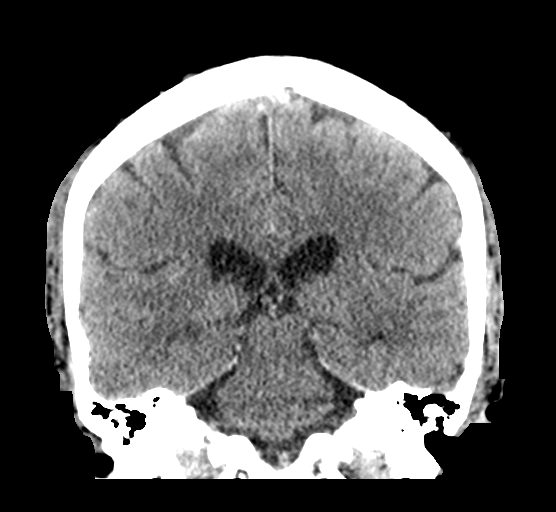

[Series 5: sagittal soft tissue · sagittal · 0.31mm/px · 3 of 58 slices shown]
[im 20/58  brain]
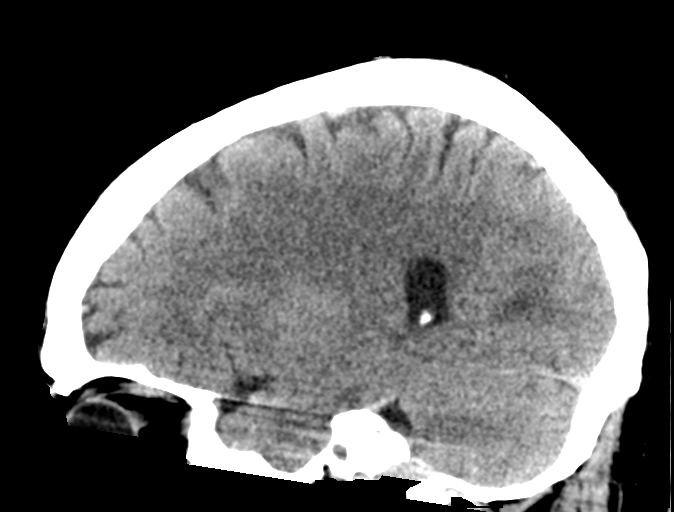
[im 29/58  brain]
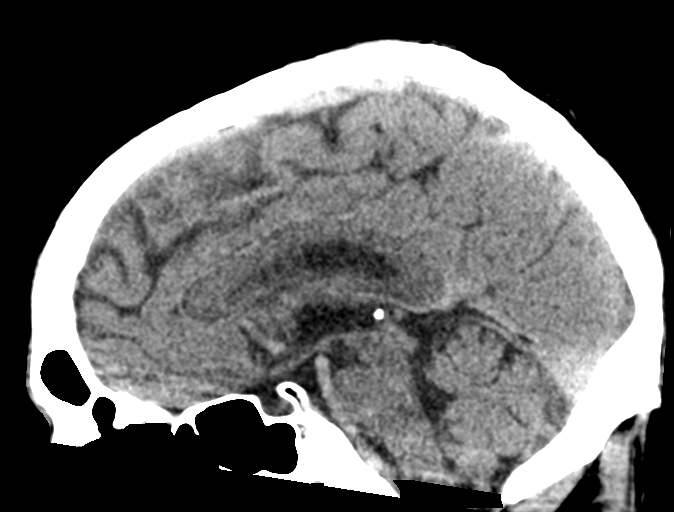
[im 39/58  brain]
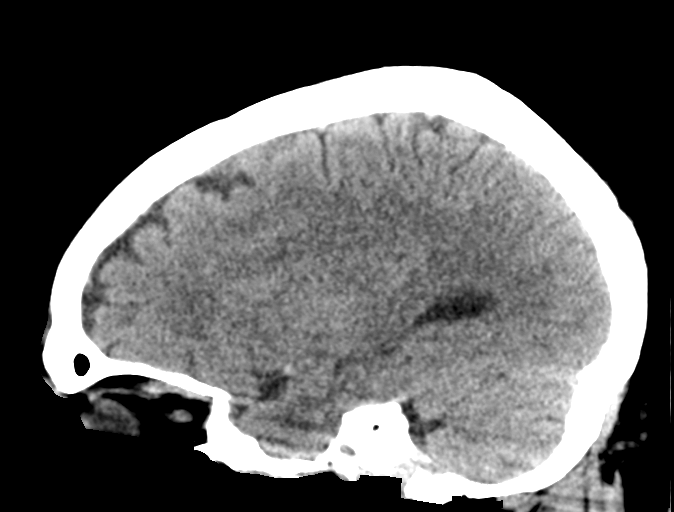

[16 of 47 positions shown; findings below may reference images not displayed]

FINDINGS: Brain: The ventricles and sulci appropriate size for patient's age.
The gray-white matter discrimination is preserved. There is no acute
intracranial hemorrhage. No mass effect or midline shift. No
extra-axial fluid collection.

Vascular: No hyperdense vessel or unexpected calcification.

Skull: Normal. Negative for fracture or focal lesion.

Sinuses/Orbits: No acute finding.

Other: None
IMPRESSION: No acute intracranial pathology.

## 2019-11-17 ENCOUNTER — Other Ambulatory Visit: Payer: Self-pay | Admitting: Cardiology

## 2020-02-29 ENCOUNTER — Other Ambulatory Visit: Payer: Self-pay | Admitting: Cardiology

## 2020-03-28 ENCOUNTER — Other Ambulatory Visit: Payer: Self-pay | Admitting: Cardiology

## 2020-06-06 ENCOUNTER — Other Ambulatory Visit: Payer: Self-pay | Admitting: Cardiology

## 2020-10-02 ENCOUNTER — Other Ambulatory Visit: Payer: Self-pay | Admitting: Cardiology

## 2020-11-24 ENCOUNTER — Other Ambulatory Visit: Payer: Self-pay | Admitting: Cardiology

## 2020-11-29 ENCOUNTER — Other Ambulatory Visit: Payer: Self-pay | Admitting: Cardiology

## 2020-12-01 ENCOUNTER — Telehealth: Payer: Self-pay | Admitting: Cardiology

## 2020-12-01 MED ORDER — CLOPIDOGREL BISULFATE 75 MG PO TABS: 75.0000 mg | ORAL_TABLET | Freq: Every day | ORAL | 1 refills | Status: DC

## 2020-12-01 NOTE — Telephone Encounter (Signed)
   1. Which medications need to be refilled? (please list name of each medication and dose if known) clopidogrel 75 mg   2. Which pharmacy/location (including street and city if local pharmacy) is medication to be sent to? [place], [place]   3. Do they need a 30 day or 90 day supply? 30   Patient is out of his medication. States that [employer] sent request.

## 2020-12-01 NOTE — Telephone Encounter (Signed)
Patient has appt in September, refilled to [place]

## 2020-12-23 ENCOUNTER — Encounter: Payer: Self-pay | Admitting: Physician Assistant

## 2020-12-23 NOTE — Progress Notes (Addendum)
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 Last cath was in 2014, results outlined below, normal EF at that time. He had a stress test in 2020 due to some residual chest discomfort that was low risk.   He presents for follow-up back today overall clinically stable from symptom standpoint. No chest pain. No syncope, palpitations, edema reported. He has had chronic dyspnea "since I was a kid" and denies any significant change from this. Although BP is high today, he states it is frequently normal at other times when checked outside the office. Historically it has been frequently elevated in the office but reported as normal in telehealth visits. He has not been checking it consistently recently though. He was late arriving to the appointment today (called to let us know) and got stuck behind bus traffic so thinks this may have driven it up. We reviewed the labs pulling in from [facility] 08/2020 below at which time his LDL was high at 105, triglycerides were up, and he had worsening kidney function. He states he has been referred to nephrology and will see them next month. Recheck BP by me 160/102.  Labwork independently reviewed: 08/2020 HDL 38, LDL 105, trig 333, A1C 6.4, BUN 17, Cr 1.720, TSH wnl 12/2017 troponin negative, K 3.8, Cr 1.30, LFTs wnl, CBC ok 2018 LDL 107    Past Medical History:  Diagnosis Date   Arthritis    Chronic kidney disease, stage 2 (mild)    Coronary artery disease    NSTEMI 05/2012 s/p PTCA/DES of the mid RCA, ramus intermediate, and mid  left circumflex, LVEF 55-60%   Essential hypertension, benign    Gout 06/11/2012   Hypercholesteremia    Hyperglycemia    NSTEMI (non-ST elevated myocardial infarction) [facility])    February 2014    Past Surgical History:  Procedure Laterality Date   BACK SURGERY     HERNIA REPAIR     LEFT HEART CATHETERIZATION WITH CORONARY ANGIOGRAM N/A 06/04/2012   Procedure: LEFT HEART CATHETERIZATION WITH CORONARY ANGIOGRAM;  Surgeon: Peter M Martinique, MD;  Location: [facility] CATH LAB;  Service: Cardiovascular;  Laterality: N/A;   NECK SURGERY     PERCUTANEOUS CORONARY STENT INTERVENTION (PCI-S)  06/04/2012   Procedure: PERCUTANEOUS CORONARY STENT INTERVENTION (PCI-S);  Surgeon: Peter M Martinique, MD;  Location: [facility] CATH LAB;  Service: Cardiovascular;;    Current Medications: Current Meds  Medication Sig   aspirin EC 81 MG EC tablet Take 1 tablet (81 mg total) by mouth daily.   cholecalciferol (VITAMIN D3) 25 MCG (1000 UNIT) tablet Take 1,000 Units by mouth daily.   clopidogrel (PLAVIX) 75 MG tablet Take 1 tablet (75 mg total) by mouth daily.   colchicine 0.6 MG tablet Take by mouth as needed.   metoprolol tartrate (LOPRESSOR) 25 MG tablet Take 1 tablet by mouth twice daily   nitroGLYCERIN (NITROSTAT) 0.4 MG SL tablet Place 1 tablet (0.4 mg total) under the tongue every 5 (five) minutes as needed for chest pain (up  to 3 doses).   olmesartan (BENICAR) 40 MG tablet Take 40 mg by mouth daily.   Omega-3 Fatty Acids (FISH OIL) 1000 MG CAPS Take by mouth daily.   rosuvastatin (CRESTOR) 10 MG tablet TAKE 1 TABLET BY MOUTH EVERY OTHER DAY      Allergies:   Atorvastatin and Meperidine hcl   Social History   Socioeconomic History   Marital status: Divorced    Spouse name: Not on file   Number of children: Not on file   Years of education: Not on file   Highest education level: Not on file  Occupational History   Not on file  Tobacco Use   Smoking status: Former    Types: Cigarettes    Start date: 04/16/1980     Quit date: 04/16/2001    Years since quitting: 19.7   Smokeless tobacco: Never  Vaping Use   Vaping Use: Never used  Substance and Sexual Activity   Alcohol use: No    Alcohol/week: 0.0 standard drinks   Drug use: No   Sexual activity: Yes    Birth control/protection: None  Other Topics Concern   Not on file  Social History Narrative   Not on file   Social Determinants of Health   Financial Resource Strain: Not on file  Food Insecurity: Not on file  Transportation Needs: Not on file  Physical Activity: Not on file  Stress: Not on file  Social Connections: Not on file     Family History:  The patient's family history includes Alzheimer's disease in his paternal grandmother; Aneurysm in his maternal grandfather; CAD in his father; CVA in his maternal grandmother; Heart attack in his paternal grandfather and paternal grandmother.  ROS:   Please see the history of present illness.  All other systems are reviewed and otherwise negative.    EKGs/Labs/Other Studies Reviewed:    Studies reviewed are outlined and summarized above. Reports included below if pertinent.  NST 02/2019 MPI result is not available in the report itself but Dr. Myles Gip result note states: "Please let him know that the stress test was low risk, did not show any large ischemic territories to suggest progressive obstructive CAD."  Cath Catheterization 2014 Cardiac Catheterization Procedure Note   Name: Seth Graves MRN: [id number] DOB: 06-Jul-1960   Procedure: Left Heart Cath, Selective Coronary Angiography, LV angiography, PTCA and stenting of the mid RCA, Ramus intermediate, and mid left circumflex arteries.   Indication: 60 year old white male with history of hypertension, hyperlipidemia, and family history of early coronary disease presents with a non-ST elevation myocardial infarction. He had recurrent chest pain at rest despite optimal medical therapy.   Procedural Details:  The right wrist was  prepped, draped, and anesthetized with 1% lidocaine. Using the modified Seldinger technique, a 5 French sheath was introduced into the right radial artery. 3 mg of verapamil was administered through the sheath, weight-based unfractionated heparin was administered intravenously. Standard Judkins catheters were used for selective coronary angiography and left ventriculography. Catheter exchanges were performed over an exchange length guidewire.   PROCEDURAL FINDINGS Hemodynamics: AO 132/85 with a mean of 108 mmHg LV 132/13 mmHg               Coronary angiography: Coronary dominance: right   Left mainstem: Large and normal   Left anterior descending (LAD): The left anterior descending artery is a large vessel extends around the apex. There is a long 30% stenosis in the mid vessel. The first diagonal is without significant disease.  Ramus intermediate: This is a large branch which has a 90-95% stenosis in the proximal vessel.   Left circumflex (LCx): There is a long segmental stenosis in the mid vessel up to 70%.   Right coronary artery (RCA):  The right coronary is a very large dominant vessel. It has mild disease in the proximal vessel up to 20-30%. In the mid vessel there is a focal stenosis of 90-95% at an acute angulated bend. There is ectasia following this lesion and then a segment of 30% disease distal.   Left ventriculography: Left ventricular systolic function is normal, LVEF is estimated at 55-60%, there is mild mid anterior hypokinesis, there is no significant mitral regurgitation    PCI Note:  Following the diagnostic procedure, the decision was made to proceed with PCI. The radial sheath was upsized to a 6 [place]. Brilinta 180 mg was given orally. Weight-based bivalirudin was given for anticoagulation. Once a therapeutic ACT was achieved, a 6 [place] XB RCA guide catheter was inserted.  A pro-water coronary guidewire was used to cross the lesion in the mid RCA.  The lesion was  predilated with a 2.5 mm balloon.  The lesion was then stented with a 4.0 x 28 mm Promus premier stent.  The stent was postdilated with a 4.5 mm noncompliant balloon.  Following PCI, there was 0% residual stenosis and TIMI-3 flow. Final angiography confirmed an excellent result.   We then proceeded with intervention of the ramus intermediate branch. We switched to a XB LAD 3.5 guide. A pro-water coronary guidewire was used to cross the lesion. The lesion was predilated with a 2.5 mm balloon. The lesion was then stented with a 3.0 x 20 mm Promus premier stent the stent was postdilated with a 3.0 mm noncompliant balloon. Following PCI, there was 0% residual stenosis and TIMI grade 3 flow. Final angiography confirmed an excellent result.   We next proceeded with intervention of the mid left circumflex. This lesion was crossed with the pro-water. It was predilated using a 2.5 mm balloon. It was then stented with a 3.0 x 20 mm Promus premier stent. The stent was postdilated with a 3.0 mm noncompliant balloon. Following PCI, there was 0% residual stenosis and TIMI grade 3 flow. Final angiography confirmed an excellent result. The patient tolerated the procedure well. There were no immediate procedural complications. A TR band was used for radial hemostasis. The patient was transferred to the post catheterization recovery area for further monitoring.   PCI Data: Vessel #1 - RCA/Segment - mid Percent Stenosis (pre)  90-95% TIMI-flow 3 Stent 4.0 x 28 mm Promus premier Percent Stenosis (post) 0% TIMI-flow (post) 3   Vessel #2-ramus intermediate/proximal Percent stenoses: 90-95% TIMI flow 3 Stent: 3.0 x 20 mm Promus premier Percent stenoses (post): 0% TIMI flow: 3   Vessel #3-left circumflex/mid Percent stenoses: 70% TIMI flow 3 Stent: 3.0 x 20 mm Promus premier Percent stenoses (post) 0% TIMI flow: 3   Final Conclusions:   1. Three-vessel obstructive coronary disease. 2. Good left ventricular  function. 3. Successful stenting of the mid RCA, proximal ramus intermediate, and mid left circumflex with drug-eluting stents.    Recommendations:  Risk factor modification. Recommend dual antiplatelet therapy for one year.   [facility] 06/04/2012, 10:49 AM     EKG:  EKG is ordered today, personally reviewed, demonstrating NSR 61bpm no acute STT changes   Recent Labs: No results found for requested labs within last 8760 hours.  Recent Lipid Panel  Component Value Date/Time   CHOL 186 04/27/2016 0958   TRIG 154 (H) 04/27/2016 0958   HDL 48 04/27/2016 0958   CHOLHDL 3.9 04/27/2016 0958   VLDL 31 (H) 04/27/2016 0958   LDLCALC 107 (H) 04/27/2016 0958    PHYSICAL EXAM:    VS:  BP (!) 158/91   Pulse 61   Ht 6' (1.829 m)   Wt 250 lb (113.4 kg)   SpO2 96%   BMI 33.91 kg/m   BMI: Body mass index is 33.91 kg/m.  GEN: Well nourished, well developed male in no acute distress HEENT: normocephalic, atraumatic Neck: no JVD, carotid bruits, or masses Cardiac: RRR; no murmurs, rubs, or gallops, no edema  Respiratory:  clear to auscultation bilaterally, normal work of breathing GI: soft, nontender, nondistended, + BS MS: no deformity or atrophy Skin: warm and dry, no rash Neuro:  Alert and Oriented x 3, Strength and sensation are intact, follows commands Psych: euthymic mood, full affect  Wt Readings from Last 3 Encounters:  12/26/20 250 lb (113.4 kg)  02/03/19 257 lb (116.6 kg)  09/04/18 247 lb (112 kg)     ASSESSMENT & PLAN:   1. CAD - clinically stable from symptom standpoint, but needs work on cardiac risk factors. We had long discussion about this today. He states he is of the mindset that whatever will happen will happen, but I outlined the rationale of treating risk factors to decrease time to progression of CAD, stroke risk, kidney failure. Dr. Domenic Polite has maintained him on long term DAPT with ASA and Plavix. Continue metoprolol. Lipids are not at goal. See  below.  2. Essential HTN - Suboptimal blood pressure control noted today. He is wary that it is actually his medications causing his recent kidney issues. I am concerned he may have developed progressive CKD due to poorly controlled BP. We need a better idea of what it's running at home. The patient was provided instructions on monitoring blood pressure at home the next few days and relaying results on Friday. If BP remains elevated, would consider addition of amlodipine +/- transition metoprolol to carvedilol. Will defer to primary care on continuation of ARB who have been trending his CKD.  3. Hyperlipidemia goal LDL <70 - discussed finding with patient. Previously had myalgias on atorvastatin and simvastatin but tolerating rosuvastatin '10mg'$  every other day. Would benefit from referral from pharmD team to discuss PCSK9i or alternative escalation of therapy. He states his cholesterol was up because he was off krill oil at the time but has since resumed fish oil. He does not wish to revisit lab testing/management in our office but will reach out to primary care to discuss a plan for rechecking.  4. CKD stage III, unspecified a-b (no GFR available to review) - last Cr 08/2020 was 1.720, higher than previous values in 2019, followed by primary care. He states he has been referred to nephrology for evaluation. Anticipate they will repeat labs at that [place]. I emphasized the importance of good BP control.   Disposition: F/u with Dr. Domenic Polite in 3 months to ensure risk factors addressed and controlled from this OV. 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   Medication Adjustments/Labs and Tests Ordered: Current medicines are reviewed at length with the patient today.  Concerns regarding medicines are outlined above. Medication changes, Labs and Tests ordered today are summarized above and listed in the Patient Instructions accessible in Encounters.    Signed, Charlie Pitter, PA-C  12/26/2020 3:24 PM     [facility] - [place] Location in [place], [place] [address] Ph: [phone]; Fax [phone]

## 2020-12-26 ENCOUNTER — Ambulatory Visit: Payer: Managed Care, Other (non HMO) | Admitting: Physician Assistant

## 2020-12-26 ENCOUNTER — Other Ambulatory Visit: Payer: Self-pay

## 2020-12-26 ENCOUNTER — Encounter: Payer: Self-pay | Admitting: Physician Assistant

## 2020-12-26 VITALS — BP 158/91 | HR 61 | Ht 72.0 in | Wt 250.0 lb

## 2020-12-26 DIAGNOSIS — E785 Hyperlipidemia, unspecified: Secondary | ICD-10-CM

## 2020-12-26 DIAGNOSIS — N183 Chronic kidney disease, stage 3 unspecified: Secondary | ICD-10-CM

## 2020-12-26 DIAGNOSIS — I10 Essential (primary) hypertension: Secondary | ICD-10-CM

## 2020-12-26 DIAGNOSIS — N182 Chronic kidney disease, stage 2 (mild): Secondary | ICD-10-CM

## 2020-12-26 DIAGNOSIS — I2510 Atherosclerotic heart disease of native coronary artery without angina pectoris: Secondary | ICD-10-CM | POA: Diagnosis not present

## 2020-12-26 NOTE — Patient Instructions (Signed)
Medication Instructions:  Your physician recommends that you continue on your current medications as directed. Please refer to the Current Medication list given to you today.  *If you need a refill on your cardiac medications before your next appointment, please call your pharmacy*   Lab Work: NONE   If you have labs (blood work) drawn today and your tests are completely normal, you will receive your results only by: [place] (if you have MyChart) OR A paper copy in the mail If you have any lab test that is abnormal or we need to change your treatment, we will call you to review the results.   Testing/Procedures: NONE    Follow-Up: At [facility], you and your health needs are our priority.  As part of our continuing mission to provide you with exceptional heart care, we have created designated Provider Care Teams.  These Care Teams include your primary Cardiologist (physician) and Advanced Practice Providers (APPs -  Physician Assistants and Nurse Practitioners) who all work together to provide you with the care you need, when you need it.  We recommend signing up for the patient portal called "MyChart".  Sign up information is provided on this After Visit Summary.  MyChart is used to connect with patients for Virtual Visits (Telemedicine).  Patients are able to view lab/test results, encounter notes, upcoming appointments, etc.  Non-urgent messages can be sent to your provider as well.   To learn more about what you can do with MyChart, go to [email or web address].    Your next appointment:   3 -4 month(s)  The format for your next appointment:   In Person  Provider:   Rozann Lesches, MD   Other Instructions Thank you for choosing [facility]!   We need to get a better idea of what your blood pressure is running at home. Here are some instructions to follow: - I would recommend using a blood pressure cuff that goes on your arm. The wrist ones can be  inaccurate. If you're purchasing one for the first time, try to select one that also reports your heart rate because this can be helpful information as well. - To check your blood pressure, choose a time at least 3 hours after taking your blood pressure medicines. If you can sample it at different times of the day, that's great - it might give you more information about how your blood pressure fluctuates. Remain seated in a chair for 5 minutes quietly beforehand, then check it.  - Please record a list of those readings and call us/send in MyChart message with them for our review by Friday 12/30/20.

## 2021-02-08 ENCOUNTER — Other Ambulatory Visit: Payer: Self-pay | Admitting: Cardiology

## 2021-03-20 NOTE — Progress Notes (Signed)
Cardiology Office Note    Date:  03/27/2021   ID:  Seth Graves, DOB 29-Jul-1960, MRN [id number]   PCP:  Seth Sites, MD   [place]  Cardiologist:  Seth Lesches, MD   Advanced Practice Provider:  Imogene Burn, PA-C Electrophysiologist:  None   [id number]}   Chief Complaint  Patient presents with   Follow-up    History of Present Illness:  Seth Graves is a 60 y.o. male  with history of CAD s/p DES to the RCA, ramus intermedius and LCx in 2014, low risk NST 2020, HTN, HLD, arthritis, suspected CKD stage stage III   Patient was last seen by Seth Graves 12/26/2020 at which time blood pressure was elevated and he was asked to monitor at home.  Consider amlodipine and possibly transition to carvedilol.  Patient comes in for f/u. Has been keeping track of BP at home ranging 140-170/80's. Pulse usually in the 70's.This am running high. Has gout, had a large coffee and walked dog today. Took meds several hrs ago. Says he always feels better with a higher BP. Has freq indigestion. He's Tums weekly. Seeing kidney specialist next week. Eats Tuna/salmon from pouch and chicken nuggets, eats out a lot-biscuits etc.  Past Medical History:  Diagnosis Date   Arthritis    CKD (chronic kidney disease)    Coronary artery disease    NSTEMI 05/2012 s/p PTCA/DES of the mid RCA, ramus intermediate, and mid left circumflex, LVEF 55-60%   Essential hypertension, benign    Gout 06/11/2012   Hypercholesteremia    Hyperglycemia    NSTEMI (non-ST elevated myocardial infarction) [facility]    February 2014    Past Surgical History:  Procedure Laterality Date   BACK SURGERY     HERNIA REPAIR     LEFT HEART CATHETERIZATION WITH CORONARY ANGIOGRAM N/A 06/04/2012   Procedure: LEFT HEART CATHETERIZATION WITH CORONARY ANGIOGRAM;  Surgeon: Seth M Martinique, MD;  Location: [facility] CATH LAB;  Service: Cardiovascular;  Laterality: N/A;   NECK SURGERY     PERCUTANEOUS CORONARY STENT  INTERVENTION (PCI-S)  06/04/2012   Procedure: PERCUTANEOUS CORONARY STENT INTERVENTION (PCI-S);  Surgeon: Seth M Martinique, MD;  Location: [facility] CATH LAB;  Service: Cardiovascular;;    Current Medications: Current Meds  Medication Sig   amLODipine (NORVASC) 5 MG tablet Take 1 tablet (5 mg total) by mouth daily.   Ascorbic Acid (VITAMIN C) 100 MG CHEW Chew by mouth.   aspirin EC 81 MG EC tablet Take 1 tablet (81 mg total) by mouth daily.   cholecalciferol (VITAMIN D3) 25 MCG (1000 UNIT) tablet Take 1,000 Units by mouth daily.   clopidogrel (PLAVIX) 75 MG tablet Take 1 tablet by mouth once daily   metoprolol tartrate (LOPRESSOR) 25 MG tablet Take 1 tablet by mouth twice daily   Multiple Vitamin (MULTIVITAMIN) tablet Take 1 tablet by mouth daily.   nitroGLYCERIN (NITROSTAT) 0.4 MG SL tablet Place 1 tablet (0.4 mg total) under the tongue every 5 (five) minutes as needed for chest pain (up to 3 doses).   olmesartan (BENICAR) 40 MG tablet Take 40 mg by mouth daily.   Omega-3 Fatty Acids (FISH OIL) 1000 MG CAPS Take by mouth daily.   rosuvastatin (CRESTOR) 10 MG tablet TAKE 1 TABLET BY MOUTH EVERY OTHER DAY     Allergies:   Atorvastatin and Meperidine hcl   Social History   Socioeconomic History   Marital status: Married    Spouse name: Not on  file   Number of children: Not on file   Years of education: Not on file   Highest education level: Not on file  Occupational History   Not on file  Tobacco Use   Smoking status: Former    Types: Cigarettes    Start date: 04/16/1980    Quit date: 04/16/2001    Years since quitting: 19.9   Smokeless tobacco: Never  Vaping Use   Vaping Use: Never used  Substance and Sexual Activity   Alcohol use: No    Alcohol/week: 0.0 standard drinks   Drug use: No   Sexual activity: Yes    Birth control/protection: None  Other Topics Concern   Not on file  Social History Narrative   Not on file   Social Determinants of Health   Financial Resource Strain:  Not on file  Food Insecurity: Not on file  Transportation Needs: Not on file  Physical Activity: Not on file  Stress: Not on file  Social Connections: Not on file     Family History:  The patient's  family history includes Alzheimer's disease in his paternal grandmother; Aneurysm in his maternal grandfather; CAD in his father; CVA in his maternal grandmother; Heart attack in his paternal grandfather and paternal grandmother.   ROS:   Please see the history of present illness.    ROS All other systems reviewed and are negative.   PHYSICAL EXAM:   VS:  BP (!) 160/102   Pulse 62   Ht 6' (1.829 m)   Wt 251 lb 6.4 oz (114 kg)   SpO2 97%   BMI 34.10 kg/m   Physical Exam  GEN: Well nourished, well developed, in no acute distress  Neck: no JVD, carotid bruits, or masses Cardiac:RRR; no murmurs, rubs, or gallops  Respiratory:  clear to auscultation bilaterally, normal work of breathing GI: soft, nontender, nondistended, + BS Ext: without cyanosis, clubbing, or edema, Good distal pulses bilaterally Neuro:  Alert and Oriented x 3 Psych: euthymic mood, full affect  Wt Readings from Last 3 Encounters:  03/27/21 251 lb 6.4 oz (114 kg)  12/26/20 250 lb (113.4 kg)  02/03/19 257 lb (116.6 kg)      Studies/Labs Reviewed:   EKG:  EKG is not ordered today.     Recent Labs: No results found for requested labs within last 8760 hours.   Lipid Panel    Component Value Date/Time   CHOL 186 04/27/2016 0958   TRIG 154 (H) 04/27/2016 0958   HDL 48 04/27/2016 0958   CHOLHDL 3.9 04/27/2016 0958   VLDL 31 (H) 04/27/2016 0958   LDLCALC 107 (H) 04/27/2016 0958    Additional studies/ records that were reviewed today include:    NST 02/2019 MPI result is not available in the report itself but Dr. Myles [facility] result note states: "Please let him know that the stress test was low risk, did not show any large ischemic territories to suggest progressive obstructive CAD."   Cath Catheterization  2014 Cardiac Catheterization Procedure Note   Name: Seth Graves MRN: [id number] DOB: Aug 17, 1960   Procedure: Left Heart Cath, Selective Coronary Angiography, LV angiography, PTCA and stenting of the mid RCA, Ramus intermediate, and mid left circumflex arteries.   Indication: 60 year old white male with history of hypertension, hyperlipidemia, and family history of early coronary disease presents with a non-ST elevation myocardial infarction. He had recurrent chest pain at rest despite optimal medical therapy.   Procedural Details:  The right wrist was prepped, draped, and  anesthetized with 1% lidocaine. Using the modified Seldinger technique, a 5 French sheath was introduced into the right radial artery. 3 mg of verapamil was administered through the sheath, weight-based unfractionated heparin was administered intravenously. Standard Judkins catheters were used for selective coronary angiography and left ventriculography. Catheter exchanges were performed over an exchange length guidewire.   PROCEDURAL FINDINGS Hemodynamics: AO 132/85 with a mean of 108 mmHg LV 132/13 mmHg               Coronary angiography: Coronary dominance: right   Left mainstem: Large and normal   Left anterior descending (LAD): The left anterior descending artery is a large vessel extends around the apex. There is a long 30% stenosis in the mid vessel. The first diagonal is without significant disease.   Ramus intermediate: This is a large branch which has a 90-95% stenosis in the proximal vessel.   Left circumflex (LCx): There is a long segmental stenosis in the mid vessel up to 70%.   Right coronary artery (RCA):  The right coronary is a very large dominant vessel. It has mild disease in the proximal vessel up to 20-30%. In the mid vessel there is a focal stenosis of 90-95% at an acute angulated bend. There is ectasia following this lesion and then a segment of 30% disease distal.   Left ventriculography: Left  ventricular systolic function is normal, LVEF is estimated at 55-60%, there is mild mid anterior hypokinesis, there is no significant mitral regurgitation    PCI Note:  Following the diagnostic procedure, the decision was made to proceed with PCI. The radial sheath was upsized to a 6 [place]. Brilinta 180 mg was given orally. Weight-based bivalirudin was given for anticoagulation. Once a therapeutic ACT was achieved, a 6 [place] XB RCA guide catheter was inserted.  A pro-water coronary guidewire was used to cross the lesion in the mid RCA.  The lesion was predilated with a 2.5 mm balloon.  The lesion was then stented with a 4.0 x 28 mm Promus premier stent.  The stent was postdilated with a 4.5 mm noncompliant balloon.  Following PCI, there was 0% residual stenosis and TIMI-3 flow. Final angiography confirmed an excellent result.   We then proceeded with intervention of the ramus intermediate branch. We switched to a XB LAD 3.5 guide. A pro-water coronary guidewire was used to cross the lesion. The lesion was predilated with a 2.5 mm balloon. The lesion was then stented with a 3.0 x 20 mm Promus premier stent the stent was postdilated with a 3.0 mm noncompliant balloon. Following PCI, there was 0% residual stenosis and TIMI grade 3 flow. Final angiography confirmed an excellent result.   We next proceeded with intervention of the mid left circumflex. This lesion was crossed with the pro-water. It was predilated using a 2.5 mm balloon. It was then stented with a 3.0 x 20 mm Promus premier stent. The stent was postdilated with a 3.0 mm noncompliant balloon. Following PCI, there was 0% residual stenosis and TIMI grade 3 flow. Final angiography confirmed an excellent result. The patient tolerated the procedure well. There were no immediate procedural complications. A TR band was used for radial hemostasis. The patient was transferred to the post catheterization recovery area for further monitoring.   PCI  Data: Vessel #1 - RCA/Segment - mid Percent Stenosis (pre)  90-95% TIMI-flow 3 Stent 4.0 x 28 mm Promus premier Percent Stenosis (post) 0% TIMI-flow (post) 3   Vessel #2-ramus intermediate/proximal Percent stenoses: 90-95% TIMI  flow 3 Stent: 3.0 x 20 mm Promus premier Percent stenoses (post): 0% TIMI flow: 3   Vessel #3-left circumflex/mid Percent stenoses: 70% TIMI flow 3 Stent: 3.0 x 20 mm Promus premier Percent stenoses (post) 0% TIMI flow: 3   Final Conclusions:   1. Three-vessel obstructive coronary disease. 2. Good left ventricular function. 3. Successful stenting of the mid RCA, proximal ramus intermediate, and mid left circumflex with drug-eluting stents.    Recommendations:  Risk factor modification. Recommend dual antiplatelet therapy for one year.   [facility] 06/04/2012, 10:49 AM       Risk Assessment/Calculations:         ASSESSMENT:    1. Coronary artery disease involving native coronary artery of native heart without angina pectoris   2. Essential hypertension   3. Hyperlipidemia, unspecified hyperlipidemia type   4. Stage 3 chronic kidney disease, unspecified whether stage 3a or 3b CKD ([place])      PLAN:  In order of problems listed above:  CAD status post DES to the RCA, ramus intermedius and circumflex in 2014, low risk NST 2020. No chest pain. No regular exercise because of gout and working nights.  Hypertension BP running high-getting extra salt in diet, has gout, extra caffeine. Will add amlodipine 5 mg once daily and low sodium diet. Reassess in a couple weeks  HLD LDL goal less than 70, previous myalgias but tolerating rosuvastatin 10 mg every other day.  CKD stage III-seeing kidney specialist next week  Shared Decision Making/Informed Consent        Medication Adjustments/Labs and Tests Ordered: Current medicines are reviewed at length with the patient today.  Concerns regarding medicines are outlined above.  Medication  changes, Labs and Tests ordered today are listed in the Patient Instructions below. Patient Instructions  Medication Instructions:  Your physician has recommended you make the following change in your medication:  START Amlodipine (Norvasc) 5 mg tablets daily  *If you need a refill on your cardiac medications before your next appointment, please call your pharmacy*   Lab Work: None If you have labs (blood work) drawn today and your tests are completely normal, you will receive your results only by: [place] (if you have MyChart) OR A paper copy in the mail If you have any lab test that is abnormal or we need to change your treatment, we will call you to review the results.   Testing/Procedures: None   Follow-Up: At [facility], you and your health needs are our priority.  As part of our continuing mission to provide you with exceptional heart care, we have created designated Provider Care Teams.  These Care Teams include your primary Cardiologist (physician) and Advanced Practice Providers (APPs -  Physician Assistants and Nurse Practitioners) who all work together to provide you with the care you need, when you need it.  We recommend signing up for the patient portal called "MyChart".  Sign up information is provided on this After Visit Summary.  MyChart is used to connect with patients for Virtual Visits (Telemedicine).  Patients are able to view lab/test results, encounter notes, upcoming appointments, etc.  Non-urgent messages can be sent to your provider as well.   To learn more about what you can do with MyChart, go to [email or web address].    Your next appointment:   1 month(s)  The format for your next appointment:   In Person  Provider:   Ermalinda Barrios, PA-C    Other Instructions Mediterranean Diet A Mediterranean diet refers  to food and lifestyle choices that are based on the traditions of countries located on the [employer]. It focuses on eating  more fruits, vegetables, whole grains, beans, nuts, seeds, and heart-healthy fats, and eating less dairy, meat, eggs, and processed foods with added sugar, salt, and fat. This way of eating has been shown to help prevent certain conditions and improve outcomes for people who have chronic diseases, like kidney disease and heart disease. What are tips for following this plan? Reading food labels Check the serving size of packaged foods. For foods such as rice and pasta, the serving size refers to the amount of cooked product, not dry. Check the total fat in packaged foods. Avoid foods that have saturated fat or trans fats. Check the ingredient list for added sugars, such as corn syrup. Shopping  Buy a variety of foods that offer a balanced diet, including: Fresh fruits and vegetables (produce). Grains, beans, nuts, and seeds. Some of these may be available in unpackaged forms or large amounts (in bulk). Fresh seafood. Poultry and eggs. Low-fat dairy products. Buy whole ingredients instead of prepackaged foods. Buy fresh fruits and vegetables in-season from local farmers markets. Buy plain frozen fruits and vegetables. If you do not have access to quality fresh seafood, buy precooked frozen shrimp or canned fish, such as tuna, salmon, or sardines. Stock your pantry so you always have certain foods on hand, such as olive oil, canned tuna, canned tomatoes, rice, pasta, and beans. Cooking Cook foods with extra-virgin olive oil instead of using butter or other vegetable oils. Have meat as a side dish, and have vegetables or grains as your main dish. This means having meat in small portions or adding small amounts of meat to foods like pasta or stew. Use beans or vegetables instead of meat in common dishes like chili or lasagna. Experiment with different cooking methods. Try roasting, broiling, steaming, and sauting vegetables. Add frozen vegetables to soups, stews, pasta, or rice. Add nuts or  seeds for added healthy fats and plant protein at each meal. You can add these to yogurt, salads, or vegetable dishes. Marinate fish or vegetables using olive oil, lemon juice, garlic, and fresh herbs. Meal planning Plan to eat one vegetarian meal one day each week. Try to work up to two vegetarian meals, if possible. Eat seafood two or more times a week. Have healthy snacks readily available, such as: Vegetable sticks with hummus. Greek yogurt. Fruit and nut trail mix. Eat balanced meals throughout the week. This includes: Fruit: 2-3 servings a day. Vegetables: 4-5 servings a day. Low-fat dairy: 2 servings a day. Fish, poultry, or lean meat: 1 serving a day. Beans and legumes: 2 or more servings a week. Nuts and seeds: 1-2 servings a day. Whole grains: 6-8 servings a day. Extra-virgin olive oil: 3-4 servings a day. Limit red meat and sweets to only a few servings a month. Lifestyle  Cook and eat meals together with your family, when possible. Drink enough fluid to keep your urine pale yellow. Be physically active every day. This includes: Aerobic exercise like running or swimming. Leisure activities like gardening, walking, or housework. Get 7-8 hours of sleep each night. If recommended by your health care provider, drink red wine in moderation. This means 1 glass a day for nonpregnant women and 2 glasses a day for men. A glass of wine equals 5 oz (150 mL). What foods should I eat? Fruits Apples. Apricots. Avocado. Berries. Bananas. Cherries. Dates. Figs. Grapes. Lemons. Melon. Oranges.  Peaches. Plums. Pomegranate. Vegetables Artichokes. Beets. Broccoli. Cabbage. Carrots. Eggplant. Green beans. Chard. Kale. Spinach. Onions. Leeks. Peas. Squash. Tomatoes. Peppers. Radishes. Grains Whole-grain pasta. Brown rice. Bulgur wheat. Polenta. Couscous. Whole-wheat bread. [place]. Meats and other proteins Beans. Almonds. Sunflower seeds. Pine nuts. Peanuts. [place]. Salmon. Scallops.  Shrimp. [place]. Tilapia. Clams. Oysters. Eggs. Poultry without skin. Dairy Low-fat milk. Cheese. Greek yogurt. Fats and oils Extra-virgin olive oil. Avocado oil. Grapeseed oil. Beverages Water. Red wine. Herbal tea. Sweets and desserts Greek yogurt with honey. Baked apples. Poached pears. Trail mix. Seasonings and condiments Basil. Cilantro. Coriander. Cumin. Mint. Parsley. Sage. Rosemary. Tarragon. Garlic. Oregano. Thyme. Pepper. Balsamic vinegar. Tahini. Hummus. Tomato sauce. Olives. Mushrooms. The items listed above may not be a complete list of foods and beverages you can eat. Contact a dietitian for more information. What foods should I limit? This is a list of foods that should be eaten rarely or only on special occasions. Fruits Fruit canned in syrup. Vegetables Deep-fried potatoes (french fries). Grains Prepackaged pasta or rice dishes. Prepackaged cereal with added sugar. Prepackaged snacks with added sugar. Meats and other proteins Beef. Pork. Lamb. Poultry with skin. Hot dogs. [place] Salines. Dairy Ice cream. Sour cream. Whole milk. Fats and oils Butter. Canola oil. Vegetable oil. Beef fat (tallow). Lard. Beverages Juice. Sugar-sweetened soft drinks. Beer. Liquor and spirits. Sweets and desserts Cookies. Cakes. Pies. Candy. Seasonings and condiments Mayonnaise. Pre-made sauces and marinades. The items listed above may not be a complete list of foods and beverages you should limit. Contact a dietitian for more information. Summary The Mediterranean diet includes both food and lifestyle choices. Eat a variety of fresh fruits and vegetables, beans, nuts, seeds, and whole grains. Limit the amount of red meat and sweets that you eat. If recommended by your health care provider, drink red wine in moderation. This means 1 glass a day for nonpregnant women and 2 glasses a day for men. A glass of wine equals 5 oz (150 mL). This information is not intended to replace advice given to you  by your health care provider. Make sure you discuss any questions you have with your health care provider. Document Revised: 05/08/2019 Document Reviewed: 03/05/2019 Elsevier Patient Education  2022 [place].    Two Gram Sodium Diet 2000 mg  What is Sodium? Sodium is a mineral found naturally in many foods. The most significant source of sodium in the diet is table salt, which is about 40% sodium.  Processed, convenience, and preserved foods also contain a large amount of sodium.  The body needs only 500 mg of sodium daily to function,  A normal diet provides more than enough sodium even if you do not use salt.  Why Limit Sodium? A build up of sodium in the body can cause thirst, increased blood pressure, shortness of breath, and water retention.  Decreasing sodium in the diet can reduce edema and risk of heart attack or stroke associated with high blood pressure.  Keep in mind that there are many other factors involved in these health problems.  Heredity, obesity, lack of exercise, cigarette smoking, stress and what you eat all play a role.  General Guidelines: Do not add salt at the table or in cooking.  One teaspoon of salt contains over 2 grams of sodium. Read food labels Avoid processed and convenience foods Ask your dietitian before eating any foods not dicussed in the menu planning guidelines Consult your physician if you wish to use a salt substitute or a sodium containing  medication such as antacids.  Limit milk and milk products to 16 oz (2 cups) per day.  Shopping Hints: READ LABELS!! "Dietetic" does not necessarily mean low sodium. Salt and other sodium ingredients are often added to foods during processing.    Menu Planning Guidelines Food Group Choose More Often Avoid  Beverages (see also the milk group All fruit juices, low-sodium, salt-free vegetables juices, low-sodium carbonated beverages Regular vegetable or tomato juices, commercially softened water used for  drinking or cooking  Breads and Cereals Enriched white, wheat, rye and pumpernickel bread, hard rolls and dinner rolls; muffins, cornbread and waffles; most dry cereals, cooked cereal without added salt; unsalted crackers and breadsticks; low sodium or homemade bread crumbs Bread, rolls and crackers with salted tops; quick breads; instant hot cereals; pancakes; commercial bread stuffing; self-rising flower and biscuit mixes; regular bread crumbs or cracker crumbs  Desserts and Sweets Desserts and sweets mad with mild should be within allowance Instant pudding mixes and cake mixes  Fats Butter or margarine; vegetable oils; unsalted salad dressings, regular salad dressings limited to 1 Tbs; light, sour and heavy cream Regular salad dressings containing bacon fat, bacon bits, and salt pork; snack dips made with instant soup mixes or processed cheese; salted nuts  Fruits Most fresh, frozen and canned fruits Fruits processed with salt or sodium-containing ingredient (some dried fruits are processed with sodium sulfites        Vegetables Fresh, frozen vegetables and low- sodium canned vegetables Regular canned vegetables, sauerkraut, pickled vegetables, and others prepared in brine; frozen vegetables in sauces; vegetables seasoned with ham, bacon or salt pork  Condiments, Sauces, Miscellaneous  Salt substitute with physician's approval; pepper, herbs, spices; vinegar, lemon or lime juice; hot pepper sauce; garlic powder, onion powder, low sodium soy sauce (1 Tbs.); low sodium condiments (ketchup, chili sauce, mustard) in limited amounts (1 tsp.) fresh ground horseradish; unsalted tortilla chips, pretzels, potato chips, popcorn, salsa (1/4 cup) Any seasoning made with salt including garlic salt, celery salt, onion salt, and seasoned salt; sea salt, rock salt, kosher salt; meat tenderizers; monosodium glutamate; mustard, regular soy sauce, barbecue, sauce, chili sauce, teriyaki sauce, steak sauce,  Worcestershire sauce, and most flavored vinegars; canned gravy and mixes; regular condiments; salted snack foods, olives, picles, relish, horseradish sauce, catsup   Food preparation: Try these seasonings Meats:    Pork Sage, onion Serve with applesauce  Chicken Poultry seasoning, thyme, parsley Serve with cranberry sauce  Lamb Curry powder, rosemary, garlic, thyme Serve with mint sauce or jelly  Veal Marjoram, basil Serve with current jelly, cranberry sauce  Beef Pepper, bay leaf Serve with dry mustard, unsalted chive butter  Fish Bay leaf, dill Serve with unsalted lemon butter, unsalted parsley butter  Vegetables:    Asparagus Lemon juice   Broccoli Lemon juice   Carrots Mustard dressing parsley, mint, nutmeg, glazed with unsalted butter and sugar   Green beans Marjoram, lemon juice, nutmeg,dill seed   Tomatoes Basil, marjoram, onion   Spice /blend for [employer] [facility]" 4 tsp ground thyme 1 tsp ground sage 3 tsp ground rosemary 4 tsp ground marjoram   Test your knowledge A product that says "Salt Free" may still contain sodium. True or False Garlic Powder and Hot Pepper Sauce an be used as alternative seasonings.True or False Processed foods have more sodium than fresh foods.  True or False Canned Vegetables have less sodium than froze True or False   WAYS TO DECREASE YOUR SODIUM INTAKE Avoid the use of added salt in  cooking and at the table.  Table salt (and other prepared seasonings which contain salt) is probably one of the greatest sources of sodium in the diet.  Unsalted foods can gain flavor from the sweet, sour, and butter taste sensations of herbs and spices.  Instead of using salt for seasoning, try the following seasonings with the foods listed.  Remember: how you use them to enhance natural food flavors is limited only by your creativity... Allspice-Meat, fish, eggs, fruit, peas, red and yellow vegetables Almond Extract-Fruit baked goods Anise Seed-Sweet breads, fruit,  carrots, beets, cottage cheese, cookies (tastes like licorice) Basil-Meat, fish, eggs, vegetables, rice, vegetables salads, soups, sauces Bay Leaf-Meat, fish, stews, poultry Burnet-Salad, vegetables (cucumber-like flavor) Caraway Seed-Bread, cookies, cottage cheese, meat, vegetables, cheese, rice Cardamon-Baked goods, fruit, soups Celery Powder or seed-Salads, salad dressings, sauces, meatloaf, soup, bread.Do not use  celery salt Chervil-Meats, salads, fish, eggs, vegetables, cottage cheese (parsley-like flavor) Chili Power-Meatloaf, chicken cheese, corn, eggplant, egg dishes Chives-Salads cottage cheese, egg dishes, soups, vegetables, sauces Cilantro-Salsa, casseroles Cinnamon-Baked goods, fruit, pork, lamb, chicken, carrots Cloves-Fruit, baked goods, fish, pot roast, green beans, beets, carrots Coriander-Pastry, cookies, meat, salads, cheese (lemon-orange flavor) Cumin-Meatloaf, fish,cheese, eggs, cabbage,fruit pie (caraway flavor) [employer], fruit, eggs, fish, poultry, cottage cheese, vegetables Dill Seed-Meat, cottage cheese, poultry, vegetables, fish, salads, bread Fennel Seed-Bread, cookies, apples, pork, eggs, fish, beets, cabbage, cheese, Licorice-like flavor Garlic-(buds or powder) Salads, meat, poultry, fish, bread, butter, vegetables, potatoes.Do not  use garlic salt Ginger-Fruit, vegetables, baked goods, meat, fish, poultry Horseradish Root-Meet, vegetables, butter Lemon Juice or Extract-Vegetables, fruit, tea, baked goods, fish salads Mace-Baked goods fruit, vegetables, fish, poultry (taste like nutmeg) Maple Extract-Syrups Marjoram-Meat, chicken, fish, vegetables, breads, green salads (taste like Sage) Mint-Tea, lamb, sherbet, vegetables, desserts, carrots, cabbage Mustard, Dry or Seed-Cheese, eggs, meats, vegetables, poultry Nutmeg-Baked goods, fruit, chicken, eggs, vegetables, desserts Onion Powder-Meat, fish, poultry, vegetables, cheese, eggs, bread, rice salads  (Do not use   Onion salt) Orange Extract-Desserts, baked goods Oregano-Pasta, eggs, cheese, onions, pork, lamb, fish, chicken, vegetables, green salads Paprika-Meat, fish, poultry, eggs, cheese, vegetables Parsley Flakes-Butter, vegetables, meat fish, poultry, eggs, bread, salads (certain forms may   Contain sodium Pepper-Meat fish, poultry, vegetables, eggs Peppermint Extract-Desserts, baked goods Poppy Seed-Eggs, bread, cheese, fruit dressings, baked goods, noodles, vegetables, cottage  [employer], poultry, meat, fish, cauliflower, turnips,eggs bread Saffron-Rice, bread, veal, chicken, fish, eggs Sage-Meat, fish, poultry, onions, eggplant, tomateos, pork, stews Savory-Eggs, salads, poultry, meat, rice, vegetables, soups, pork Tarragon-Meat, poultry, fish, eggs, butter, vegetables (licorice-like flavor)  Thyme-Meat, poultry, fish, eggs, vegetables, (clover-like flavor), sauces, soups Tumeric-Salads, butter, eggs, fish, rice, vegetables (saffron-like flavor) Vanilla Extract-Baked goods, candy Vinegar-Salads, vegetables, meat marinades Walnut Extract-baked goods, candy   2. Choose your Foods Wisely   The following is a list of foods to avoid which are high in sodium:  Meats-Avoid all smoked, canned, salt cured, dried and kosher meat and fish as well as Anchovies   [employer] meats:Bologna, Liverwurst, Pastrami Canned meat or fish  Marinated herring Caviar    Pepperoni Corned Beef   Pizza Dried chipped beef  Salami Frozen breaded fish or meat Salt pork Frankfurters or hot dogs  Sardines Gefilte fish   Sausage Ham (boiled ham, Proscuitto Smoked butt    spiced ham)   Spam      TV Dinners Vegetables Canned vegetables (Regular) Relish Canned mushrooms  Sauerkraut Olives    Tomato juice Pickles  Bakery and Dessert Products Canned puddings  Cream  pies Cheesecake   Decorated cakes Cookies  Beverages/Juices Tomato juice,  regular  Gatorade   V-8 vegetable juice, regular  Breads and Cereals Biscuit mixes   Salted potato chips, corn chips, pretzels Bread stuffing mixes  Salted crackers and rolls Pancake and waffle mixes Self-rising flour  Seasonings Accent    Meat sauces Barbecue sauce  Meat tenderizer Catsup    Monosodium glutamate (MSG) Celery salt   Onion salt Chili sauce   Prepared mustard Garlic salt   Salt, seasoned salt, sea salt Gravy mixes   Soy sauce Horseradish   Steak sauce Ketchup   Tartar sauce Lite salt    Teriyaki sauce Marinade mixes   Worcestershire sauce  Others Baking powder   Cocoa and cocoa mixes Baking soda   Commercial casserole mixes Candy-caramels, chocolate  Dehydrated soups    Bars, fudge,nougats  Instant rice and pasta mixes Canned broth or soup  Maraschino cherries Cheese, aged and processed cheese and cheese spreads  Learning Assessment Quiz  Indicated T (for True) or F (for False) for each of the following statements:  _____ Fresh fruits and vegetables and unprocessed grains are generally low in sodium _____ Water may contain a considerable amount of sodium, depending on the source _____ You can always tell if a food is high in sodium by tasting it _____ Certain laxatives my be high in sodium and should be avoided unless prescribed   by a physician or pharmacist _____ Salt substitutes may be used freely by anyone on a sodium restricted diet _____ Sodium is present in table salt, food additives and as a natural component of   most foods _____ Table salt is approximately 90% sodium _____ Limiting sodium intake may help prevent excess fluid accumulation in the body _____ On a sodium-restricted diet, seasonings such as bouillon soy sauce, and    cooking wine should be used in place of table salt _____ On an ingredient list, a product which lists monosodium glutamate as the first   ingredient is an appropriate food to include on a low sodium diet  Circle the best  answer(s) to the following statements (Hint: there may be more than one correct answer)  11. On a low-sodium diet, some acceptable snack items are:    A. Olives  F. Bean dip   K. Grapefruit juice    B. Salted Pretzels G. Commercial Popcorn   L. Canned peaches    C. Carrot Sticks  H. Bouillon   M. Unsalted nuts   D. [place] fries  I. Peanut butter crackers N. Salami   E. Sweet pickles J. Tomato Juice   O. Pizza  12.  Seasonings that may be used freely on a reduced - sodium diet include   A. Lemon wedges F.Monosodium glutamate K. Celery seed    B.Soysauce   G. Pepper   L. Mustard powder   C. Sea salt  H. Cooking wine  M. Onion flakes   D. Vinegar  E. Prepared horseradish N. Salsa   E. Sage   J. Worcestershire sauce  O. [address]     Sumner Boast, PA-C  03/27/2021 11:25 AM    [facility] [place], [address], [place]  [address] Phone: [phone]; Fax: [phone]

## 2021-03-24 ENCOUNTER — Other Ambulatory Visit: Payer: Self-pay | Admitting: Nephrology

## 2021-03-24 ENCOUNTER — Other Ambulatory Visit (HOSPITAL_COMMUNITY): Payer: Self-pay | Admitting: Nephrology

## 2021-03-24 DIAGNOSIS — N183 Chronic kidney disease, stage 3 unspecified: Secondary | ICD-10-CM

## 2021-03-27 ENCOUNTER — Other Ambulatory Visit: Payer: Self-pay

## 2021-03-27 ENCOUNTER — Ambulatory Visit: Payer: Managed Care, Other (non HMO) | Admitting: Physician Assistant

## 2021-03-27 ENCOUNTER — Encounter: Payer: Self-pay | Admitting: Physician Assistant

## 2021-03-27 VITALS — BP 160/102 | HR 62 | Ht 72.0 in | Wt 251.4 lb

## 2021-03-27 DIAGNOSIS — N183 Chronic kidney disease, stage 3 unspecified: Secondary | ICD-10-CM | POA: Diagnosis not present

## 2021-03-27 DIAGNOSIS — I10 Essential (primary) hypertension: Secondary | ICD-10-CM | POA: Diagnosis not present

## 2021-03-27 DIAGNOSIS — E785 Hyperlipidemia, unspecified: Secondary | ICD-10-CM

## 2021-03-27 DIAGNOSIS — I2510 Atherosclerotic heart disease of native coronary artery without angina pectoris: Secondary | ICD-10-CM | POA: Diagnosis not present

## 2021-03-27 MED ORDER — AMLODIPINE BESYLATE 5 MG PO TABS: 5.0000 mg | ORAL_TABLET | Freq: Every day | ORAL | 3 refills | Status: DC

## 2021-03-27 NOTE — Patient Instructions (Signed)
Medication Instructions:  Your physician has recommended you make the following change in your medication:  START Amlodipine (Norvasc) 5 mg tablets daily  *If you need a refill on your cardiac medications before your next appointment, please call your pharmacy*   Lab Work: None If you have labs (blood work) drawn today and your tests are completely normal, you will receive your results only by: [place] (if you have MyChart) OR A paper copy in the mail If you have any lab test that is abnormal or we need to change your treatment, we will call you to review the results.   Testing/Procedures: None   Follow-Up: At [facility], you and your health needs are our priority.  As part of our continuing mission to provide you with exceptional heart care, we have created designated Provider Care Teams.  These Care Teams include your primary Cardiologist (physician) and Advanced Practice Providers (APPs -  Physician Assistants and Nurse Practitioners) who all work together to provide you with the care you need, when you need it.  We recommend signing up for the patient portal called "MyChart".  Sign up information is provided on this After Visit Summary.  MyChart is used to connect with patients for Virtual Visits (Telemedicine).  Patients are able to view lab/test results, encounter notes, upcoming appointments, etc.  Non-urgent messages can be sent to your provider as well.   To learn more about what you can do with MyChart, go to [email or web address].    Your next appointment:   1 month(s)  The format for your next appointment:   In Person  Provider:   Ermalinda Barrios, PA-C    Other Instructions Mediterranean Diet A Mediterranean diet refers to food and lifestyle choices that are based on the traditions of countries located on the [place]. It focuses on eating more fruits, vegetables, whole grains, beans, nuts, seeds, and heart-healthy fats, and eating less dairy,  meat, eggs, and processed foods with added sugar, salt, and fat. This way of eating has been shown to help prevent certain conditions and improve outcomes for people who have chronic diseases, like kidney disease and heart disease. What are tips for following this plan? Reading food labels Check the serving size of packaged foods. For foods such as rice and pasta, the serving size refers to the amount of cooked product, not dry. Check the total fat in packaged foods. Avoid foods that have saturated fat or trans fats. Check the ingredient list for added sugars, such as corn syrup. Shopping  Buy a variety of foods that offer a balanced diet, including: Fresh fruits and vegetables (produce). Grains, beans, nuts, and seeds. Some of these may be available in unpackaged forms or large amounts (in bulk). Fresh seafood. Poultry and eggs. Low-fat dairy products. Buy whole ingredients instead of prepackaged foods. Buy fresh fruits and vegetables in-season from local farmers markets. Buy plain frozen fruits and vegetables. If you do not have access to quality fresh seafood, buy precooked frozen shrimp or canned fish, such as tuna, salmon, or sardines. Stock your pantry so you always have certain foods on hand, such as olive oil, canned tuna, canned tomatoes, rice, pasta, and beans. Cooking Cook foods with extra-virgin olive oil instead of using butter or other vegetable oils. Have meat as a side dish, and have vegetables or grains as your main dish. This means having meat in small portions or adding small amounts of meat to foods like pasta or stew. Use beans or vegetables instead of  meat in common dishes like chili or lasagna. Experiment with different cooking methods. Try roasting, broiling, steaming, and sauting vegetables. Add frozen vegetables to soups, stews, pasta, or rice. Add nuts or seeds for added healthy fats and plant protein at each meal. You can add these to yogurt, salads, or vegetable  dishes. Marinate fish or vegetables using olive oil, lemon juice, garlic, and fresh herbs. Meal planning Plan to eat one vegetarian meal one day each week. Try to work up to two vegetarian meals, if possible. Eat seafood two or more times a week. Have healthy snacks readily available, such as: Vegetable sticks with hummus. Greek yogurt. Fruit and nut trail mix. Eat balanced meals throughout the week. This includes: Fruit: 2-3 servings a day. Vegetables: 4-5 servings a day. Low-fat dairy: 2 servings a day. Fish, poultry, or lean meat: 1 serving a day. Beans and legumes: 2 or more servings a week. Nuts and seeds: 1-2 servings a day. Whole grains: 6-8 servings a day. Extra-virgin olive oil: 3-4 servings a day. Limit red meat and sweets to only a few servings a month. Lifestyle  Cook and eat meals together with your family, when possible. Drink enough fluid to keep your urine pale yellow. Be physically active every day. This includes: Aerobic exercise like running or swimming. Leisure activities like gardening, walking, or housework. Get 7-8 hours of sleep each night. If recommended by your health care provider, drink red wine in moderation. This means 1 glass a day for nonpregnant women and 2 glasses a day for men. A glass of wine equals 5 oz (150 mL). What foods should I eat? Fruits Apples. Apricots. Avocado. Berries. Bananas. Cherries. Dates. Figs. Grapes. Lemons. Melon. Oranges. Peaches. Plums. Pomegranate. Vegetables Artichokes. Beets. Broccoli. Cabbage. Carrots. Eggplant. Green beans. Chard. Kale. Spinach. Onions. Leeks. Peas. Squash. Tomatoes. Peppers. Radishes. Grains Whole-grain pasta. Brown rice. Bulgur wheat. Polenta. Couscous. Whole-wheat bread. [place]. Meats and other proteins Beans. Almonds. Sunflower seeds. Pine nuts. Peanuts. [place]. Salmon. Scallops. Shrimp. [place]. Tilapia. Clams. Oysters. Eggs. Poultry without skin. Dairy Low-fat milk. Cheese. Greek  yogurt. Fats and oils Extra-virgin olive oil. Avocado oil. Grapeseed oil. Beverages Water. Red wine. Herbal tea. Sweets and desserts Greek yogurt with honey. Baked apples. Poached pears. Trail mix. Seasonings and condiments Basil. Cilantro. Coriander. Cumin. Mint. Parsley. Sage. Rosemary. Tarragon. Garlic. Oregano. Thyme. Pepper. Balsamic vinegar. Tahini. Hummus. Tomato sauce. Olives. Mushrooms. The items listed above may not be a complete list of foods and beverages you can eat. Contact a dietitian for more information. What foods should I limit? This is a list of foods that should be eaten rarely or only on special occasions. Fruits Fruit canned in syrup. Vegetables Deep-fried potatoes (french fries). Grains Prepackaged pasta or rice dishes. Prepackaged cereal with added sugar. Prepackaged snacks with added sugar. Meats and other proteins Beef. Pork. Lamb. Poultry with skin. Hot dogs. Berniece Salines. Dairy Ice cream. Sour cream. Whole milk. Fats and oils Butter. Canola oil. Vegetable oil. Beef fat (tallow). Lard. Beverages Juice. Sugar-sweetened soft drinks. Beer. Liquor and spirits. Sweets and desserts Cookies. Cakes. Pies. Candy. Seasonings and condiments Mayonnaise. Pre-made sauces and marinades. The items listed above may not be a complete list of foods and beverages you should limit. Contact a dietitian for more information. Summary The Mediterranean diet includes both food and lifestyle choices. Eat a variety of fresh fruits and vegetables, beans, nuts, seeds, and whole grains. Limit the amount of red meat and sweets that you eat. If recommended by your health care  provider, drink red wine in moderation. This means 1 glass a day for nonpregnant women and 2 glasses a day for men. A glass of wine equals 5 oz (150 mL). This information is not intended to replace advice given to you by your health care provider. Make sure you discuss any questions you have with your health care  provider. Document Revised: 05/08/2019 Document Reviewed: 03/05/2019 Elsevier Patient Education  2022 [place].    Two Gram Sodium Diet 2000 mg  What is Sodium? Sodium is a mineral found naturally in many foods. The most significant source of sodium in the diet is table salt, which is about 40% sodium.  Processed, convenience, and preserved foods also contain a large amount of sodium.  The body needs only 500 mg of sodium daily to function,  A normal diet provides more than enough sodium even if you do not use salt.  Why Limit Sodium? A build up of sodium in the body can cause thirst, increased blood pressure, shortness of breath, and water retention.  Decreasing sodium in the diet can reduce edema and risk of heart attack or stroke associated with high blood pressure.  Keep in mind that there are many other factors involved in these health problems.  Heredity, obesity, lack of exercise, cigarette smoking, stress and what you eat all play a role.  General Guidelines: Do not add salt at the table or in cooking.  One teaspoon of salt contains over 2 grams of sodium. Read food labels Avoid processed and convenience foods Ask your dietitian before eating any foods not dicussed in the menu planning guidelines Consult your physician if you wish to use a salt substitute or a sodium containing medication such as antacids.  Limit milk and milk products to 16 oz (2 cups) per day.  Shopping Hints: READ LABELS!! "Dietetic" does not necessarily mean low sodium. Salt and other sodium ingredients are often added to foods during processing.    Menu Planning Guidelines Food Group Choose More Often Avoid  Beverages (see also the milk group All fruit juices, low-sodium, salt-free vegetables juices, low-sodium carbonated beverages Regular vegetable or tomato juices, commercially softened water used for drinking or cooking  Breads and Cereals Enriched white, wheat, rye and pumpernickel bread, hard rolls  and dinner rolls; muffins, cornbread and waffles; most dry cereals, cooked cereal without added salt; unsalted crackers and breadsticks; low sodium or homemade bread crumbs Bread, rolls and crackers with salted tops; quick breads; instant hot cereals; pancakes; commercial bread stuffing; self-rising flower and biscuit mixes; regular bread crumbs or cracker crumbs  Desserts and Sweets Desserts and sweets mad with mild should be within allowance Instant pudding mixes and cake mixes  Fats Butter or margarine; vegetable oils; unsalted salad dressings, regular salad dressings limited to 1 Tbs; light, sour and heavy cream Regular salad dressings containing bacon fat, bacon bits, and salt pork; snack dips made with instant soup mixes or processed cheese; salted nuts  Fruits Most fresh, frozen and canned fruits Fruits processed with salt or sodium-containing ingredient (some dried fruits are processed with sodium sulfites        Vegetables Fresh, frozen vegetables and low- sodium canned vegetables Regular canned vegetables, sauerkraut, pickled vegetables, and others prepared in brine; frozen vegetables in sauces; vegetables seasoned with ham, bacon or salt pork  Condiments, Sauces, Miscellaneous  Salt substitute with physician's approval; pepper, herbs, spices; vinegar, lemon or lime juice; hot pepper sauce; garlic powder, onion powder, low sodium soy sauce (1 Tbs.); low  sodium condiments (ketchup, chili sauce, mustard) in limited amounts (1 tsp.) fresh ground horseradish; unsalted tortilla chips, pretzels, potato chips, popcorn, salsa (1/4 cup) Any seasoning made with salt including garlic salt, celery salt, onion salt, and seasoned salt; sea salt, rock salt, kosher salt; meat tenderizers; monosodium glutamate; mustard, regular soy sauce, barbecue, sauce, chili sauce, teriyaki sauce, steak sauce, Worcestershire sauce, and most flavored vinegars; canned gravy and mixes; regular condiments; salted snack foods,  olives, picles, relish, horseradish sauce, catsup   Food preparation: Try these seasonings Meats:    Pork Sage, onion Serve with applesauce  Chicken Poultry seasoning, thyme, parsley Serve with cranberry sauce  Lamb Curry powder, rosemary, garlic, thyme Serve with mint sauce or jelly  Veal Marjoram, basil Serve with current jelly, cranberry sauce  Beef Pepper, bay leaf Serve with dry mustard, unsalted chive butter  Fish [place] leaf, dill Serve with unsalted lemon butter, unsalted parsley butter  Vegetables:    Asparagus Lemon juice   Broccoli Lemon juice   Carrots Mustard dressing parsley, mint, nutmeg, glazed with unsalted butter and sugar   Green beans Marjoram, lemon juice, nutmeg,dill seed   Tomatoes Basil, marjoram, onion   Spice /blend for [employer]" 4 tsp ground thyme 1 tsp ground sage 3 tsp ground rosemary 4 tsp ground marjoram   Test your knowledge A product that says "Salt Free" may still contain sodium. True or False Garlic Powder and Hot Pepper Sauce an be used as alternative seasonings.True or False Processed foods have more sodium than fresh foods.  True or False Canned Vegetables have less sodium than froze True or False   WAYS TO DECREASE YOUR SODIUM INTAKE Avoid the use of added salt in cooking and at the table.  Table salt (and other prepared seasonings which contain salt) is probably one of the greatest sources of sodium in the diet.  Unsalted foods can gain flavor from the sweet, sour, and butter taste sensations of herbs and spices.  Instead of using salt for seasoning, try the following seasonings with the foods listed.  Remember: how you use them to enhance natural food flavors is limited only by your creativity... Allspice-Meat, fish, eggs, fruit, peas, red and yellow vegetables Almond Extract-Fruit baked goods Anise Seed-Sweet breads, fruit, carrots, beets, cottage cheese, cookies (tastes like licorice) Basil-Meat, fish, eggs, vegetables, rice, vegetables  salads, soups, sauces Bay Leaf-Meat, fish, stews, poultry Burnet-Salad, vegetables (cucumber-like flavor) Caraway Seed-Bread, cookies, cottage cheese, meat, vegetables, cheese, rice Cardamon-Baked goods, fruit, soups Celery Powder or seed-Salads, salad dressings, sauces, meatloaf, soup, bread.Do not use  celery salt Chervil-Meats, salads, fish, eggs, vegetables, cottage cheese (parsley-like flavor) Chili Power-Meatloaf, chicken cheese, corn, eggplant, egg dishes Chives-Salads cottage cheese, egg dishes, soups, vegetables, sauces Cilantro-Salsa, casseroles Cinnamon-Baked goods, fruit, pork, lamb, chicken, carrots Cloves-Fruit, baked goods, fish, pot roast, green beans, beets, carrots Coriander-Pastry, cookies, meat, salads, cheese (lemon-orange flavor) Cumin-Meatloaf, fish,cheese, eggs, cabbage,fruit pie (caraway flavor) [employer], fruit, eggs, fish, poultry, cottage cheese, vegetables Dill Seed-Meat, cottage cheese, poultry, vegetables, fish, salads, bread Fennel Seed-Bread, cookies, apples, pork, eggs, fish, beets, cabbage, cheese, Licorice-like flavor Garlic-(buds or powder) Salads, meat, poultry, fish, bread, butter, vegetables, potatoes.Do not  use garlic salt Ginger-Fruit, vegetables, baked goods, meat, fish, poultry Horseradish Root-Meet, vegetables, butter Lemon Juice or Extract-Vegetables, fruit, tea, baked goods, fish salads Mace-Baked goods fruit, vegetables, fish, poultry (taste like nutmeg) Maple Extract-Syrups Marjoram-Meat, chicken, fish, vegetables, breads, green salads (taste like Sage) Mint-Tea, lamb, sherbet, vegetables, desserts, carrots, cabbage Mustard, Dry or Seed-Cheese, eggs, meats,  vegetables, poultry Nutmeg-Baked goods, fruit, chicken, eggs, vegetables, desserts Onion Powder-Meat, fish, poultry, vegetables, cheese, eggs, bread, rice salads (Do not use   Onion salt) Orange Extract-Desserts, baked goods Oregano-Pasta, eggs, cheese, onions, pork, lamb,  fish, chicken, vegetables, green salads Paprika-Meat, fish, poultry, eggs, cheese, vegetables Parsley Flakes-Butter, vegetables, meat fish, poultry, eggs, bread, salads (certain forms may   Contain sodium Pepper-Meat fish, poultry, vegetables, eggs Peppermint Extract-Desserts, baked goods Poppy Seed-Eggs, bread, cheese, fruit dressings, baked goods, noodles, vegetables, cottage  [employer], poultry, meat, fish, cauliflower, turnips,eggs bread Saffron-Rice, bread, veal, chicken, fish, eggs Sage-Meat, fish, poultry, onions, eggplant, tomateos, pork, stews Savory-Eggs, salads, poultry, meat, rice, vegetables, soups, pork Tarragon-Meat, poultry, fish, eggs, butter, vegetables (licorice-like flavor)  Thyme-Meat, poultry, fish, eggs, vegetables, (clover-like flavor), sauces, soups Tumeric-Salads, butter, eggs, fish, rice, vegetables (saffron-like flavor) Vanilla Extract-Baked goods, candy Vinegar-Salads, vegetables, meat marinades Walnut Extract-baked goods, candy   2. Choose your Foods Wisely   The following is a list of foods to avoid which are high in sodium:  Meats-Avoid all smoked, canned, salt cured, dried and kosher meat and fish as well as Anchovies   [employer] meats:Bologna, Liverwurst, Pastrami Canned meat or fish  Marinated herring Caviar    Pepperoni Corned Beef   Pizza Dried chipped beef  Salami Frozen breaded fish or meat Salt pork Frankfurters or hot dogs  Sardines Gefilte fish   Sausage Ham (boiled ham, Proscuitto Smoked butt    spiced ham)   Spam      TV Dinners Vegetables Canned vegetables (Regular) Relish Canned mushrooms  Sauerkraut Olives    Tomato juice Pickles  Bakery and Dessert Products Canned puddings  Cream pies Cheesecake   Decorated cakes Cookies  Beverages/Juices Tomato juice, regular  Gatorade   V-8 vegetable juice, regular  Breads and Cereals Biscuit mixes   Salted potato chips, corn  chips, pretzels Bread stuffing mixes  Salted crackers and rolls Pancake and waffle mixes Self-rising flour  Seasonings Accent    Meat sauces Barbecue sauce  Meat tenderizer Catsup    Monosodium glutamate (MSG) Celery salt   Onion salt Chili sauce   Prepared mustard Garlic salt   Salt, seasoned salt, sea salt Gravy mixes   Soy sauce Horseradish   Steak sauce Ketchup   Tartar sauce Lite salt    Teriyaki sauce Marinade mixes   Worcestershire sauce  Others Baking powder   Cocoa and cocoa mixes Baking soda   Commercial casserole mixes Candy-caramels, chocolate  Dehydrated soups    Bars, fudge,nougats  Instant rice and pasta mixes Canned broth or soup  Maraschino cherries Cheese, aged and processed cheese and cheese spreads  Learning Assessment Quiz  Indicated T (for True) or F (for False) for each of the following statements:  _____ Fresh fruits and vegetables and unprocessed grains are generally low in sodium _____ Water may contain a considerable amount of sodium, depending on the source _____ You can always tell if a food is high in sodium by tasting it _____ Certain laxatives my be high in sodium and should be avoided unless prescribed   by a physician or pharmacist _____ Salt substitutes may be used freely by anyone on a sodium restricted diet _____ Sodium is present in table salt, food additives and as a natural component of   most foods _____ Table salt is approximately 90% sodium _____ Limiting sodium intake may help prevent excess fluid accumulation in the body _____ On a sodium-restricted diet, seasonings  such as bouillon soy sauce, and    cooking wine should be used in place of table salt _____ On an ingredient list, a product which lists monosodium glutamate as the first   ingredient is an appropriate food to include on a low sodium diet  Circle the best answer(s) to the following statements (Hint: there may be more than one correct answer)  11. On a low-sodium diet,  some acceptable snack items are:    A. Olives  F. Bean dip   K. Grapefruit juice    B. Salted Pretzels G. Commercial Popcorn   L. Canned peaches    C. Carrot Sticks  H. Bouillon   M. Unsalted nuts   D. [place] fries  I. Peanut butter crackers N. Salami   E. Sweet pickles J. Tomato Juice   O. Pizza  12.  Seasonings that may be used freely on a reduced - sodium diet include   A. Lemon wedges F.Monosodium glutamate K. Celery seed    B.Soysauce   G. Pepper   L. Mustard powder   C. Sea salt  H. Cooking wine  M. Onion flakes   D. Vinegar  E. Prepared horseradish N. Salsa   E. Sage   J. Worcestershire sauce  O. Chutney

## 2021-04-02 ENCOUNTER — Other Ambulatory Visit: Payer: Self-pay | Admitting: Cardiology

## 2021-04-03 ENCOUNTER — Other Ambulatory Visit: Payer: Self-pay

## 2021-04-03 ENCOUNTER — Ambulatory Visit (HOSPITAL_COMMUNITY)
Admission: RE | Admit: 2021-04-03 | Discharge: 2021-04-03 | Disposition: A | Discharge status: Home / Self Care | Payer: Managed Care, Other (non HMO) | Source: Ambulatory Visit | Attending: Nephrology | Admitting: Nephrology

## 2021-04-03 DIAGNOSIS — N183 Chronic kidney disease, stage 3 unspecified: Secondary | ICD-10-CM | POA: Insufficient documentation

## 2021-04-17 ENCOUNTER — Other Ambulatory Visit: Payer: Self-pay | Admitting: Cardiology

## 2021-04-24 NOTE — Progress Notes (Signed)
Cardiology Office Note    Date:  05/01/2021   ID:  [name], DOB 01-Jun-1960, MRN [id number]   PCP:  Sharilyn Sites, MD   Seth Graves  Cardiologist:  Rozann Lesches, MD   Advanced Practice Provider:  Imogene Burn, PA-C Electrophysiologist:  None   [id number]   Chief Complaint  Patient presents with   Follow-up    History of Present Illness:  Seth Graves is a 61 y.o. male  with history of CAD s/p DES to the RCA, ramus intermedius and LCx in 2014, low risk NST 2020, HTN, HLD, arthritis, suspected CKD stage stage III    Patient was last seen by Ms. Dunn 12/26/2020 at which time blood pressure was elevated and he was asked to monitor at home.  Consider amlodipine and possibly transition to carvedilol.  I saw the patient 03/27/2021 and blood pressure was running high.  He was getting extra salt in his diet as well as extra caffeine.  I added amlodipine 5 mg once daily and asked him to reduce his sodium and caffeine intake.  Patient comes in for f/u. BP readings from home 128-150's/80-90's. Has cut back on salt-not eating out as much. Works nights and occasionally misses some meds on the weekends. Had another gout flare up.       Past Medical History:  Diagnosis Date   Arthritis    CKD (chronic kidney disease)    Coronary artery disease    NSTEMI 05/2012 s/p PTCA/DES of the mid RCA, ramus intermediate, and mid left circumflex, LVEF 55-60%   Essential hypertension, benign    Gout 06/11/2012   Hypercholesteremia    Hyperglycemia    NSTEMI (non-ST elevated myocardial infarction) [facility])    February 2014    Past Surgical History:  Procedure Laterality Date   BACK SURGERY     HERNIA REPAIR     LEFT HEART CATHETERIZATION WITH CORONARY ANGIOGRAM N/A 06/04/2012   Procedure: LEFT HEART CATHETERIZATION WITH CORONARY ANGIOGRAM;  Surgeon: Peter M Martinique, MD;  Location: [facility] CATH LAB;  Service: Cardiovascular;  Laterality: N/A;   NECK SURGERY      PERCUTANEOUS CORONARY STENT INTERVENTION (PCI-S)  06/04/2012   Procedure: PERCUTANEOUS CORONARY STENT INTERVENTION (PCI-S);  Surgeon: Peter M Martinique, MD;  Location: [facility] CATH LAB;  Service: Cardiovascular;;    Current Medications: Current Meds  Medication Sig   allopurinol (ZYLOPRIM) 100 MG tablet Take 200 mg by mouth daily.   amLODipine (NORVASC) 10 MG tablet Take 1 tablet (10 mg total) by mouth daily.   Ascorbic Acid (VITAMIN C) 100 MG CHEW Chew by mouth.   aspirin EC 81 MG EC tablet Take 1 tablet (81 mg total) by mouth daily.   cholecalciferol (VITAMIN D3) 25 MCG (1000 UNIT) tablet Take 1,000 Units by mouth daily.   clopidogrel (PLAVIX) 75 MG tablet Take 1 tablet by mouth once daily   colchicine 0.6 MG tablet Take by mouth as needed.   metoprolol succinate (TOPROL-XL) 50 MG 24 hr tablet Take 1 tablet (50 mg total) by mouth daily. Take with or immediately following a meal.   Multiple Vitamin (MULTIVITAMIN) tablet Take 1 tablet by mouth daily.   nitroGLYCERIN (NITROSTAT) 0.4 MG SL tablet Place 1 tablet (0.4 mg total) under the tongue every 5 (five) minutes as needed for chest pain (up to 3 doses).   olmesartan (BENICAR) 40 MG tablet Take 40 mg by mouth daily.   Omega-3 Fatty Acids (FISH OIL) 1000 MG CAPS Take  by mouth daily.   rosuvastatin (CRESTOR) 10 MG tablet TAKE 1 TABLET BY MOUTH EVERY OTHER DAY   [DISCONTINUED] amLODipine (NORVASC) 5 MG tablet Take 1 tablet (5 mg total) by mouth daily.   [DISCONTINUED] metoprolol tartrate (LOPRESSOR) 25 MG tablet Take 1 tablet by mouth twice daily     Allergies:   Atorvastatin and Meperidine hcl   Social History   Socioeconomic History   Marital status: Married    Spouse name: Not on file   Number of children: Not on file   Years of education: Not on file   Highest education level: Not on file  Occupational History   Not on file  Tobacco Use   Smoking status: Former    Types: Cigarettes    Start date: 04/16/1980    Quit date: 04/16/2001     Years since quitting: 20.0   Smokeless tobacco: Never  Vaping Use   Vaping Use: Never used  Substance and Sexual Activity   Alcohol use: No    Alcohol/week: 0.0 standard drinks   Drug use: No   Sexual activity: Yes    Birth control/protection: None  Other Topics Concern   Not on file  Social History Narrative   Not on file   Social Determinants of Health   Financial Resource Strain: Not on file  Food Insecurity: Not on file  Transportation Needs: Not on file  Physical Activity: Not on file  Stress: Not on file  Social Connections: Not on file     Family History:  The patient's  family history includes Alzheimer's disease in his paternal grandmother; Aneurysm in his maternal grandfather; CAD in his father; CVA in his maternal grandmother; Heart attack in his paternal grandfather and paternal grandmother.   ROS:   Please see the history of present illness.    ROS All other systems reviewed and are negative.   PHYSICAL EXAM:   VS:  BP (!) 152/94    Pulse 68    Ht 6' (1.829 m)    Wt 250 lb (113.4 kg)    SpO2 98%    BMI 33.91 kg/m   Physical Exam  GEN: Obese, in no acute distress  Neck: no JVD, carotid bruits, or masses Cardiac:RRR; no murmurs, rubs, or gallops  Respiratory:  clear to auscultation bilaterally, normal work of breathing GI: soft, nontender, nondistended, + BS Ext: without cyanosis, clubbing, or edema, Good distal pulses bilaterally Neuro:  Alert and Oriented x 3 Psych: euthymic mood, full affect  Wt Readings from Last 3 Encounters:  05/01/21 250 lb (113.4 kg)  03/27/21 251 lb 6.4 oz (114 kg)  12/26/20 250 lb (113.4 kg)      Studies/Labs Reviewed:   EKG:  EKG is not ordered today.    Recent Labs: No results found for requested labs within last 8760 hours.   Lipid Panel    Component Value Date/Time   CHOL 186 04/27/2016 0958   TRIG 154 (H) 04/27/2016 0958   HDL 48 04/27/2016 0958   CHOLHDL 3.9 04/27/2016 0958   VLDL 31 (H) 04/27/2016 0958    LDLCALC 107 (H) 04/27/2016 0958    Additional studies/ records that were reviewed today include:    NST 02/2019 MPI result is not available in the report itself but Dr. Myles Gip result note states: "Please let him know that the stress test was low risk, did not show any large ischemic territories to suggest progressive obstructive CAD."   Cath Catheterization 2014 Cardiac Catheterization Procedure Note  Name: RAHSHAWN REMO MRN: [id number] DOB: 26-Oct-1960   Procedure: Left Heart Cath, Selective Coronary Angiography, LV angiography, PTCA and stenting of the mid RCA, Ramus intermediate, and mid left circumflex arteries.   Indication: 61 year old white male with history of hypertension, hyperlipidemia, and family history of early coronary disease presents with a non-ST elevation myocardial infarction. He had recurrent chest pain at rest despite optimal medical therapy.   Procedural Details:  The right wrist was prepped, draped, and anesthetized with 1% lidocaine. Using the modified Seldinger technique, a 5 French sheath was introduced into the right radial artery. 3 mg of verapamil was administered through the sheath, weight-based unfractionated heparin was administered intravenously. Standard Judkins catheters were used for selective coronary angiography and left ventriculography. Catheter exchanges were performed over an exchange length guidewire.   PROCEDURAL FINDINGS Hemodynamics: AO 132/85 with a mean of 108 mmHg LV 132/13 mmHg               Coronary angiography: Coronary dominance: right   Left mainstem: Large and normal   Left anterior descending (LAD): The left anterior descending artery is a large vessel extends around the apex. There is a long 30% stenosis in the mid vessel. The first diagonal is without significant disease.   Ramus intermediate: This is a large branch which has a 90-95% stenosis in the proximal vessel.   Left circumflex (LCx): There is a long segmental  stenosis in the mid vessel up to 70%.   Right coronary artery (RCA):  The right coronary is a very large dominant vessel. It has mild disease in the proximal vessel up to 20-30%. In the mid vessel there is a focal stenosis of 90-95% at an acute angulated bend. There is ectasia following this lesion and then a segment of 30% disease distal.   Left ventriculography: Left ventricular systolic function is normal, LVEF is estimated at 55-60%, there is mild mid anterior hypokinesis, there is no significant mitral regurgitation    PCI Note:  Following the diagnostic procedure, the decision was made to proceed with PCI. The radial sheath was upsized to a 6 [place]. Brilinta 180 mg was given orally. Weight-based bivalirudin was given for anticoagulation. Once a therapeutic ACT was achieved, a 6 [place] XB RCA guide catheter was inserted.  A pro-water coronary guidewire was used to cross the lesion in the mid RCA.  The lesion was predilated with a 2.5 mm balloon.  The lesion was then stented with a 4.0 x 28 mm Promus premier stent.  The stent was postdilated with a 4.5 mm noncompliant balloon.  Following PCI, there was 0% residual stenosis and TIMI-3 flow. Final angiography confirmed an excellent result.   We then proceeded with intervention of the ramus intermediate branch. We switched to a XB LAD 3.5 guide. A pro-water coronary guidewire was used to cross the lesion. The lesion was predilated with a 2.5 mm balloon. The lesion was then stented with a 3.0 x 20 mm Promus premier stent the stent was postdilated with a 3.0 mm noncompliant balloon. Following PCI, there was 0% residual stenosis and TIMI grade 3 flow. Final angiography confirmed an excellent result.   We next proceeded with intervention of the mid left circumflex. This lesion was crossed with the pro-water. It was predilated using a 2.5 mm balloon. It was then stented with a 3.0 x 20 mm Promus premier stent. The stent was postdilated with a 3.0 mm  noncompliant balloon. Following PCI, there was 0% residual stenosis and TIMI grade  3 flow. Final angiography confirmed an excellent result. The patient tolerated the procedure well. There were no immediate procedural complications. A TR band was used for radial hemostasis. The patient was transferred to the post catheterization recovery area for further monitoring.   PCI Data: Vessel #1 - RCA/Segment - mid Percent Stenosis (pre)  90-95% TIMI-flow 3 Stent 4.0 x 28 mm Promus premier Percent Stenosis (post) 0% TIMI-flow (post) 3   Vessel #2-ramus intermediate/proximal Percent stenoses: 90-95% TIMI flow 3 Stent: 3.0 x 20 mm Promus premier Percent stenoses (post): 0% TIMI flow: 3   Vessel #3-left circumflex/mid Percent stenoses: 70% TIMI flow 3 Stent: 3.0 x 20 mm Promus premier Percent stenoses (post) 0% TIMI flow: 3   Final Conclusions:   1. Three-vessel obstructive coronary disease. 2. Good left ventricular function. 3. Successful stenting of the mid RCA, proximal ramus intermediate, and mid left circumflex with drug-eluting stents.    Recommendations:  Risk factor modification. Recommend dual antiplatelet therapy for one year.   Collier [facility] 06/04/2012, 10:49 AM          Risk Assessment/Calculations:         ASSESSMENT:    1. Coronary artery disease involving native coronary artery of native heart without angina pectoris   2. Essential hypertension   3. Hyperlipidemia, unspecified hyperlipidemia type   4. Stage 3 chronic kidney disease, unspecified whether stage 3a or 3b CKD ([place])      PLAN:  In order of problems listed above:  CAD status post DES to the RCA, ramus intermedius and circumflex in 2014, low risk NST 2020. No chest pain. No regular exercise because of gout and working nights.   Hypertension BP improved but still running high-has decreased the salt in diet, has gout, extra caffeine-sweet tea-now quit. I added amlodipine 5 mg once daily and  low sodium diet.  Increase amlodipine 10 mg once daily, change metoprolol to Toprol XL to avoid miss doses. F/u in 6 weeks   HLD LDL goal less than 70, previous myalgias but tolerating rosuvastatin 10 mg every other day.   CKD stage III-seeing kidney specialist regularly    Shared Decision Making/Informed Consent        Medication Adjustments/Labs and Tests Ordered: Current medicines are reviewed at length with the patient today.  Concerns regarding medicines are outlined above.  Medication changes, Labs and Tests ordered today are listed in the Patient Instructions below. Patient Instructions  Medication Instructions:  STOP Lopressor  START Toprol XL 50 mg daily   INCREASE Amlodipine 10 mg daily   Labwork: None today  Testing/Procedures: None   Follow-Up:  6 weeks  Any Other Special Instructions Will Be Listed Below (If Applicable).   Continue to watch salt intake  If you need a refill on your cardiac medications before your next appointment, please call your pharmacy.    Sumner Boast, PA-C  05/01/2021 11:31 AM    [facility] [address], [place], [place]  [address] Phone: [phone]; Fax: [phone]

## 2021-05-01 ENCOUNTER — Encounter: Payer: Self-pay | Admitting: Physician Assistant

## 2021-05-01 ENCOUNTER — Ambulatory Visit: Payer: Managed Care, Other (non HMO) | Admitting: Physician Assistant

## 2021-05-01 VITALS — BP 152/94 | HR 68 | Ht 72.0 in | Wt 250.0 lb

## 2021-05-01 DIAGNOSIS — I10 Essential (primary) hypertension: Secondary | ICD-10-CM | POA: Diagnosis not present

## 2021-05-01 DIAGNOSIS — N183 Chronic kidney disease, stage 3 unspecified: Secondary | ICD-10-CM

## 2021-05-01 DIAGNOSIS — E785 Hyperlipidemia, unspecified: Secondary | ICD-10-CM | POA: Diagnosis not present

## 2021-05-01 DIAGNOSIS — I2510 Atherosclerotic heart disease of native coronary artery without angina pectoris: Secondary | ICD-10-CM | POA: Diagnosis not present

## 2021-05-01 MED ORDER — AMLODIPINE BESYLATE 10 MG PO TABS: 10.0000 mg | ORAL_TABLET | Freq: Every day | ORAL | 3 refills | Status: DC

## 2021-05-01 MED ORDER — METOPROLOL SUCCINATE ER 50 MG PO TB24: 50.0000 mg | ORAL_TABLET | Freq: Every day | ORAL | 3 refills | Status: DC

## 2021-05-01 NOTE — Patient Instructions (Signed)
Medication Instructions:  STOP Lopressor  START Toprol XL 50 mg daily   INCREASE Amlodipine 10 mg daily   Labwork: None today  Testing/Procedures: None   Follow-Up:  6 weeks  Any Other Special Instructions Will Be Listed Below (If Applicable).   Continue to watch salt intake  If you need a refill on your cardiac medications before your next appointment, please call your pharmacy.

## 2021-05-29 ENCOUNTER — Other Ambulatory Visit: Payer: Self-pay | Admitting: Cardiology

## 2021-06-12 NOTE — Progress Notes (Signed)
Cardiology Office Note    Date:  06/20/2021   ID:  Seth Graves, DOB 1960-09-04, MRN [id number]   PCP:  Seth Graves, [place]  Cardiologist:  Seth Lesches, MD   Advanced Practice Provider:  Imogene Burn, PA-C Electrophysiologist:  None   [id number]   Chief Complaint  Patient presents with   Follow-up    History of Present Illness:  Seth Graves is a 61 y.o. male with history of CAD s/p DES to the RCA, ramus intermedius and LCx in 2014, low risk NST 2020, HTN, HLD, arthritis, suspected CKD stage stage III    I saw the patient 03/27/2021 and blood pressure was running high. He was getting extra salt in his diet as well as extra caffeine. I added amlodipine 5 mg once daily and asked him to reduce his sodium and caffeine intake. F/u BP's 05/01/21 and I increased amlodipine 10 mg daily, metoprolol changed to Toprol xl.  Patient comes for f/u. BP normal today. Doing better at home. Trying to good about his diet.    Past Medical History:  Diagnosis Date   Arthritis    CKD (chronic kidney disease)    Coronary artery disease    NSTEMI 05/2012 s/p PTCA/DES of the mid RCA, ramus intermediate, and mid left circumflex, LVEF 55-60%   Essential hypertension, benign    Gout 06/11/2012   Hypercholesteremia    Hyperglycemia    NSTEMI (non-ST elevated myocardial infarction) [facility])    February 2014    Past Surgical History:  Procedure Laterality Date   BACK SURGERY     HERNIA REPAIR     LEFT HEART CATHETERIZATION WITH CORONARY ANGIOGRAM N/A 06/04/2012   Procedure: LEFT HEART CATHETERIZATION WITH CORONARY ANGIOGRAM;  Surgeon: Seth M Martinique, MD;  Location: [facility] CATH LAB;  Service: Cardiovascular;  Laterality: N/A;   NECK SURGERY     PERCUTANEOUS CORONARY STENT INTERVENTION (PCI-S)  06/04/2012   Procedure: PERCUTANEOUS CORONARY STENT INTERVENTION (PCI-S);  Surgeon: Seth M Martinique, MD;  Location: [facility] CATH LAB;  Service: Cardiovascular;;    Current  Medications: Current Meds  Medication Sig   allopurinol (ZYLOPRIM) 100 MG tablet Take 200 mg by mouth daily.   amLODipine (NORVASC) 10 MG tablet Take 1 tablet (10 mg total) by mouth daily.   Ascorbic Acid (VITAMIN C) 100 MG CHEW Chew by mouth.   aspirin EC 81 MG EC tablet Take 1 tablet (81 mg total) by mouth daily.   clopidogrel (PLAVIX) 75 MG tablet Take 1 tablet by mouth once daily   colchicine 0.6 MG tablet Take by mouth as needed.   metoprolol succinate (TOPROL-XL) 50 MG 24 hr tablet Take 1 tablet (50 mg total) by mouth daily. Take with or immediately following a meal.   Multiple Vitamin (MULTIVITAMIN) tablet Take 1 tablet by mouth daily.   nitroGLYCERIN (NITROSTAT) 0.4 MG SL tablet Place 1 tablet (0.4 mg total) under the tongue every 5 (five) minutes as needed for chest pain (up to 3 doses).   olmesartan (BENICAR) 40 MG tablet Take 40 mg by mouth daily.   Omega-3 Fatty Acids (FISH OIL) 1000 MG CAPS Take by mouth daily.   rosuvastatin (CRESTOR) 10 MG tablet TAKE 1 TABLET BY MOUTH EVERY OTHER DAY     Allergies:   Atorvastatin and Meperidine hcl   Social History   Socioeconomic History   Marital status: Married    Spouse name: Not on file   Number of children: Not  on file   Years of education: Not on file   Highest education level: Not on file  Occupational History   Not on file  Tobacco Use   Smoking status: Former    Types: Cigarettes    Start date: 04/16/1980    Quit date: 04/16/2001    Years since quitting: 20.1   Smokeless tobacco: Never  Vaping Use   Vaping Use: Never used  Substance and Sexual Activity   Alcohol use: No    Alcohol/week: 0.0 standard drinks   Drug use: No   Sexual activity: Yes    Birth control/protection: None  Other Topics Concern   Not on file  Social History Narrative   Not on file   Social Determinants of Health   Financial Resource Strain: Not on file  Food Insecurity: Not on file  Transportation Needs: Not on file  Physical Activity:  Not on file  Stress: Not on file  Social Connections: Not on file     Family History:  The patient's  family history includes Alzheimer's disease in his paternal grandmother; Aneurysm in his maternal grandfather; CAD in his father; CVA in his maternal grandmother; Heart attack in his paternal grandfather and paternal grandmother.   ROS:   Please see the history of present illness.    ROS All other systems reviewed and are negative.   PHYSICAL EXAM:   VS:  BP 124/68    Pulse 87    Ht 6' (1.829 m)    Wt 251 lb 6.4 oz (114 kg)    SpO2 99%    BMI 34.10 kg/m   Physical Exam  GEN: Obese, in no acute distress Neck: no JVD, carotid bruits, or masses Cardiac:RRR; no murmurs, rubs, or gallops  Respiratory:  clear to auscultation bilaterally, normal work of breathing GI: soft, nontender, nondistended, + BS Ext: without cyanosis, clubbing, or edema, Good distal pulses bilaterally Neuro:  Alert and Oriented x 3 Psych: euthymic mood, full affect  Wt Readings from Last 3 Encounters:  06/20/21 251 lb 6.4 oz (114 kg)  05/01/21 250 lb (113.4 kg)  03/27/21 251 lb 6.4 oz (114 kg)      Studies/Labs Reviewed:   EKG:  EKG is not ordered today.    Recent Labs: No results found for requested labs within last 8760 hours.   Lipid Panel    Component Value Date/Time   CHOL 186 04/27/2016 0958   TRIG 154 (H) 04/27/2016 0958   HDL 48 04/27/2016 0958   CHOLHDL 3.9 04/27/2016 0958   VLDL 31 (H) 04/27/2016 0958   LDLCALC 107 (H) 04/27/2016 0958    Additional studies/ records that were reviewed today include:      NST 02/2019 MPI result is not available in the report itself but Dr. Myles Graves result note states: "Please let him know that the stress test was low risk, did not show any large ischemic territories to suggest progressive obstructive CAD."   Cath Catheterization 2014 Cardiac Catheterization Procedure Note   Name: Seth Graves MRN: [id number] DOB: 1960-12-10   Procedure: Left Heart  Cath, Selective Coronary Angiography, LV angiography, PTCA and stenting of the mid RCA, Ramus intermediate, and mid left circumflex arteries.   Indication: 61 year old white male with history of hypertension, hyperlipidemia, and family history of early coronary disease presents with a non-ST elevation myocardial infarction. He had recurrent chest pain at rest despite optimal medical therapy.   Procedural Details:  The right wrist was prepped, draped, and anesthetized with 1% lidocaine.  Using the modified Seldinger technique, a 5 French sheath was introduced into the right radial artery. 3 mg of verapamil was administered through the sheath, weight-based unfractionated heparin was administered intravenously. Standard Judkins catheters were used for selective coronary angiography and left ventriculography. Catheter exchanges were performed over an exchange length guidewire.   PROCEDURAL FINDINGS Hemodynamics: AO 132/85 with a mean of 108 mmHg LV 132/13 mmHg               Coronary angiography: Coronary dominance: right   Left mainstem: Large and normal   Left anterior descending (LAD): The left anterior descending artery is a large vessel extends around the apex. There is a long 30% stenosis in the mid vessel. The first diagonal is without significant disease.   Ramus intermediate: This is a large branch which has a 90-95% stenosis in the proximal vessel.   Left circumflex (LCx): There is a long segmental stenosis in the mid vessel up to 70%.   Right coronary artery (RCA):  The right coronary is a very large dominant vessel. It has mild disease in the proximal vessel up to 20-30%. In the mid vessel there is a focal stenosis of 90-95% at an acute angulated bend. There is ectasia following this lesion and then a segment of 30% disease distal.   Left ventriculography: Left ventricular systolic function is normal, LVEF is estimated at 55-60%, there is mild mid anterior hypokinesis, there is no  significant mitral regurgitation    PCI Note:  Following the diagnostic procedure, the decision was made to proceed with PCI. The radial sheath was upsized to a 6 [place]. Brilinta 180 mg was given orally. Weight-based bivalirudin was given for anticoagulation. Once a therapeutic ACT was achieved, a 6 [place] XB RCA guide catheter was inserted.  A pro-water coronary guidewire was used to cross the lesion in the mid RCA.  The lesion was predilated with a 2.5 mm balloon.  The lesion was then stented with a 4.0 x 28 mm Promus premier stent.  The stent was postdilated with a 4.5 mm noncompliant balloon.  Following PCI, there was 0% residual stenosis and TIMI-3 flow. Final angiography confirmed an excellent result.   We then proceeded with intervention of the ramus intermediate branch. We switched to a XB LAD 3.5 guide. A pro-water coronary guidewire was used to cross the lesion. The lesion was predilated with a 2.5 mm balloon. The lesion was then stented with a 3.0 x 20 mm Promus premier stent the stent was postdilated with a 3.0 mm noncompliant balloon. Following PCI, there was 0% residual stenosis and TIMI grade 3 flow. Final angiography confirmed an excellent result.   We next proceeded with intervention of the mid left circumflex. This lesion was crossed with the pro-water. It was predilated using a 2.5 mm balloon. It was then stented with a 3.0 x 20 mm Promus premier stent. The stent was postdilated with a 3.0 mm noncompliant balloon. Following PCI, there was 0% residual stenosis and TIMI grade 3 flow. Final angiography confirmed an excellent result. The patient tolerated the procedure well. There were no immediate procedural complications. A TR band was used for radial hemostasis. The patient was transferred to the post catheterization recovery area for further monitoring.   PCI Data: Vessel #1 - RCA/Segment - mid Percent Stenosis (pre)  90-95% TIMI-flow 3 Stent 4.0 x 28 mm Promus premier Percent  Stenosis (post) 0% TIMI-flow (post) 3   Vessel #2-ramus intermediate/proximal Percent stenoses: 90-95% TIMI flow 3 Stent: 3.0  x 20 mm Promus premier Percent stenoses (post): 0% TIMI flow: 3   Vessel #3-left circumflex/mid Percent stenoses: 70% TIMI flow 3 Stent: 3.0 x 20 mm Promus premier Percent stenoses (post) 0% TIMI flow: 3   Final Conclusions:   1. Three-vessel obstructive coronary disease. 2. Good left ventricular function. 3. Successful stenting of the mid RCA, proximal ramus intermediate, and mid left circumflex with drug-eluting stents.    Recommendations:  Risk factor modification. Recommend dual antiplatelet therapy for one year.   Collier Salina [place] 06/04/2012, 10:49 AM         Risk Assessment/Calculations:         ASSESSMENT:    1. Coronary artery disease involving native coronary artery of native heart without angina pectoris   2. Essential hypertension   3. Other hyperlipidemia   4. Stage 3 chronic kidney disease, unspecified whether stage 3a or 3b CKD ([place])      PLAN:  In order of problems listed above:  CAD status post DES to the RCA, ramus intermedius and circumflex in 2014, low risk NST 2020. No chest pain. No regular exercise because of gout flair ups and working nights.    Hypertension BP now normal. Continue Torpol and amlodipine, decreased caffeine and low sodium diet.   HLD LDL goal less than 70, previous myalgias but tolerating rosuvastatin 10 mg every other day.   CKD stage III-seeing kidney specialist regularly      Shared Decision Making/Informed Consent        Medication Adjustments/Labs and Tests Ordered: Current medicines are reviewed at length with the patient today.  Concerns regarding medicines are outlined above.  Medication changes, Labs and Tests ordered today are listed in the Patient Instructions below. Patient Instructions  Medication Instructions:  Your physician recommends that you continue on your current  medications as directed. Please refer to the Current Medication list given to you today.  *If you need a refill on your cardiac medications before your next appointment, please call your pharmacy*   Lab Work: NONE   If you have labs (blood work) drawn today and your tests are completely normal, you will receive your results only by: [place] (if you have MyChart) OR A paper copy in the mail If you have any lab test that is abnormal or we need to change your treatment, we will call you to review the results.   Testing/Procedures: NONE    Follow-Up: At [facility], you and your health needs are our priority.  As part of our continuing mission to provide you with exceptional heart care, we have created designated Provider Care Teams.  These Care Teams include your primary Cardiologist (physician) and Advanced Practice Providers (APPs -  Physician Assistants and Nurse Practitioners) who all work together to provide you with the care you need, when you need it.  We recommend signing up for the patient portal called "MyChart".  Sign up information is provided on this After Visit Summary.  MyChart is used to connect with patients for Virtual Visits (Telemedicine).  Patients are able to view lab/test results, encounter notes, upcoming appointments, etc.  Non-urgent messages can be sent to your provider as well.   To learn more about what you can do with MyChart, go to [email or web address].    Your next appointment:   6 month(s)  The format for your next appointment:   In Person  Provider:   Rozann Lesches, MD    Other Instructions Thank you for choosing [facility]!  Sumner Boast, PA-C  06/20/2021 11:39 AM    [facility] [place], [place], [place]  [address] Phone: [phone]; Fax: [phone]

## 2021-06-20 ENCOUNTER — Encounter: Payer: Self-pay | Admitting: Physician Assistant

## 2021-06-20 ENCOUNTER — Other Ambulatory Visit: Payer: Self-pay

## 2021-06-20 ENCOUNTER — Ambulatory Visit: Payer: Managed Care, Other (non HMO) | Admitting: Physician Assistant

## 2021-06-20 VITALS — BP 124/68 | HR 87 | Ht 72.0 in | Wt 251.4 lb

## 2021-06-20 DIAGNOSIS — E7849 Other hyperlipidemia: Secondary | ICD-10-CM

## 2021-06-20 DIAGNOSIS — N183 Chronic kidney disease, stage 3 unspecified: Secondary | ICD-10-CM | POA: Diagnosis not present

## 2021-06-20 DIAGNOSIS — I2510 Atherosclerotic heart disease of native coronary artery without angina pectoris: Secondary | ICD-10-CM

## 2021-06-20 DIAGNOSIS — I10 Essential (primary) hypertension: Secondary | ICD-10-CM

## 2021-06-20 NOTE — Patient Instructions (Signed)
Medication Instructions:  Your physician recommends that you continue on your current medications as directed. Please refer to the Current Medication list given to you today.  *If you need a refill on your cardiac medications before your next appointment, please call your pharmacy*   Lab Work: NONE   If you have labs (blood work) drawn today and your tests are completely normal, you will receive your results only by: . MyChart Message (if you have MyChart) OR . A paper copy in the mail If you have any lab test that is abnormal or we need to change your treatment, we will call you to review the results.   Testing/Procedures: NONE    Follow-Up: At [facility], you and your health needs are our priority.  As part of our continuing mission to provide you with exceptional heart care, we have created designated Provider Care Teams.  These Care Teams include your primary Cardiologist (physician) and Advanced Practice Providers (APPs -  Physician Assistants and Nurse Practitioners) who all work together to provide you with the care you need, when you need it.  We recommend signing up for the patient portal called "MyChart".  Sign up information is provided on this After Visit Summary.  MyChart is used to connect with patients for Virtual Visits (Telemedicine).  Patients are able to view lab/test results, encounter notes, upcoming appointments, etc.  Non-urgent messages can be sent to your provider as well.   To learn more about what you can do with MyChart, go to [email or web address].    Your next appointment:   6 month(s)  The format for your next appointment:   In Person  Provider:   Samuel McDowell, MD   Other Instructions Thank you for choosing [facility]!    

## 2021-07-24 ENCOUNTER — Other Ambulatory Visit: Payer: Self-pay | Admitting: Cardiology

## 2021-09-06 ENCOUNTER — Other Ambulatory Visit: Payer: Self-pay | Admitting: Cardiology

## 2021-10-16 ENCOUNTER — Other Ambulatory Visit: Payer: Self-pay | Admitting: Cardiology

## 2021-11-02 ENCOUNTER — Encounter (INDEPENDENT_AMBULATORY_CARE_PROVIDER_SITE_OTHER): Payer: Self-pay | Admitting: *Deleted

## 2022-01-09 ENCOUNTER — Ambulatory Visit: Payer: Managed Care, Other (non HMO) | Admitting: Cardiology

## 2022-01-22 ENCOUNTER — Encounter (INDEPENDENT_AMBULATORY_CARE_PROVIDER_SITE_OTHER): Payer: Self-pay | Admitting: Gastroenterology

## 2022-01-22 ENCOUNTER — Ambulatory Visit (INDEPENDENT_AMBULATORY_CARE_PROVIDER_SITE_OTHER): Payer: Managed Care, Other (non HMO) | Admitting: Gastroenterology

## 2022-01-22 ENCOUNTER — Encounter (INDEPENDENT_AMBULATORY_CARE_PROVIDER_SITE_OTHER): Payer: Self-pay

## 2022-01-22 ENCOUNTER — Telehealth: Payer: Self-pay | Admitting: *Deleted

## 2022-01-22 ENCOUNTER — Telehealth (INDEPENDENT_AMBULATORY_CARE_PROVIDER_SITE_OTHER): Payer: Self-pay

## 2022-01-22 ENCOUNTER — Other Ambulatory Visit (INDEPENDENT_AMBULATORY_CARE_PROVIDER_SITE_OTHER): Payer: Self-pay

## 2022-01-22 DIAGNOSIS — K219 Gastro-esophageal reflux disease without esophagitis: Secondary | ICD-10-CM | POA: Diagnosis not present

## 2022-01-22 DIAGNOSIS — R1319 Other dysphagia: Secondary | ICD-10-CM

## 2022-01-22 DIAGNOSIS — R1310 Dysphagia, unspecified: Secondary | ICD-10-CM | POA: Insufficient documentation

## 2022-01-22 MED ORDER — OMEPRAZOLE 40 MG PO CPDR: 40.0000 mg | DELAYED_RELEASE_CAPSULE | Freq: Every day | ORAL | 3 refills | Status: DC

## 2022-01-22 MED ORDER — PEG 3350-KCL-NA BICARB-NACL 420 G PO SOLR: 4000.0000 mL | ORAL | 0 refills | Status: DC

## 2022-01-22 NOTE — Telephone Encounter (Signed)
Seth Graves, CMA  ?

## 2022-01-22 NOTE — Progress Notes (Signed)
Seth Graves, M.D. Gastroenterology & Hepatology [facility] [address] [place], [place] [address] Primary Care Physician: Sharilyn Sites, MD [address] [place] [address]  Referring MD: PCP  Chief Complaint: Dysphagia and GERD  History of Present Illness: Seth Graves is a 61 y.o. male with PMH CAD and NSTEMI s/p stents on DAPT, HTN, gout, CKD, who presents for evaluation of dysphagia and GERD.  Patient reports that he has presented recurrent episodes of heartburn on a frequent basis. States he has been having these symptoms for at least 5 years, which have been progressing in frequency through the years. He is currently having heartburn almost every day. He reports that when he eats fried/spicy/salty foods or tomato sauce, he will have significant symptoms - however, he can also have symptoms even if eating regular food. Usually has symptoms at night or in the early AM. He has been taking Tums mostly to control his symptoms, was prescribed Protonix but did not start it as he was fearful it may interact with his Plavix.  The patient states that he has felt food was taking longer to go down 1-2 times a week in the past, although it has become less frequent. He noticed dysphagia mostly with bread and meat.  The patient denies having any nausea, vomiting, fever, chills, hematochezia, melena, hematemesis, abdominal distention, abdominal pain, diarrhea, jaundice, pruritus. Has gained a few lb in the past.  Last KNL:ZJQBH Last Colonoscopy:never  FHx: neg for any gastrointestinal/liver disease, no malignancies Social: neg smoking, alcohol or illicit drug use Surgical: inguinal hernia  Past Medical History: Past Medical History:  Diagnosis Date   Arthritis    CKD (chronic kidney disease)    Coronary artery disease    NSTEMI 05/2012 s/p PTCA/DES of the mid RCA, ramus intermediate, and mid left circumflex, LVEF 55-60%   Essential  hypertension, benign    Gout 06/11/2012   Hypercholesteremia    Hyperglycemia    NSTEMI (non-ST elevated myocardial infarction) [facility])    February 2014    Past Surgical History: Past Surgical History:  Procedure Laterality Date   BACK SURGERY     HERNIA REPAIR     LEFT HEART CATHETERIZATION WITH CORONARY ANGIOGRAM N/A 06/04/2012   Procedure: LEFT HEART CATHETERIZATION WITH CORONARY ANGIOGRAM;  Surgeon: Peter M Martinique, MD;  Location: [facility] CATH LAB;  Service: Cardiovascular;  Laterality: N/A;   NECK SURGERY     PERCUTANEOUS CORONARY STENT INTERVENTION (PCI-S)  06/04/2012   Procedure: PERCUTANEOUS CORONARY STENT INTERVENTION (PCI-S);  Surgeon: Peter M Martinique, MD;  Location: [facility] CATH LAB;  Service: Cardiovascular;;    Family History: Family History  Problem Relation Age of Onset   CAD Father        MI in his 69s   CVA Maternal Grandmother    Aneurysm Maternal Grandfather    Heart attack Paternal Grandmother    Alzheimer's disease Paternal Grandmother    Heart attack Paternal Grandfather     Social History: Social History   Tobacco Use  Smoking Status Former   Types: Cigarettes   Start date: 04/16/1980   Quit date: 04/16/2001   Years since quitting: 20.7  Smokeless Tobacco Never   Social History   Substance and Sexual Activity  Alcohol Use No   Alcohol/week: 0.0 standard drinks of alcohol   Social History   Substance and Sexual Activity  Drug Use No    Allergies: Allergies  Allergen Reactions   Atorvastatin Other (See Comments)  myalgias   Meperidine Hcl     Medications: Current Outpatient Medications  Medication Sig Dispense Refill   allopurinol (ZYLOPRIM) 100 MG tablet Take 100 mg by mouth daily. '400mg'$  total. Takes '300mg'$  plus '100mg'$  daily     amLODipine (NORVASC) 10 MG tablet Take 1 tablet (10 mg total) by mouth daily. 90 tablet 3   Ascorbic Acid (VITAMIN C) 100 MG CHEW Chew by mouth.     aspirin EC 81 MG EC tablet Take 1 tablet (81 mg total) by mouth daily.      cholecalciferol (VITAMIN D3) 25 MCG (1000 UNIT) tablet Take 1,000 Units by mouth daily.     clopidogrel (PLAVIX) 75 MG tablet Take 1 tablet by mouth once daily 90 tablet 1   KRILL OIL PO Take by mouth.     metoprolol succinate (TOPROL-XL) 50 MG 24 hr tablet Take 1 tablet (50 mg total) by mouth daily. Take with or immediately following a meal. 90 tablet 3   Multiple Vitamin (MULTIVITAMIN) tablet Take 1 tablet by mouth daily.     nitroGLYCERIN (NITROSTAT) 0.4 MG SL tablet Place 1 tablet (0.4 mg total) under the tongue every 5 (five) minutes as needed for chest pain (up to 3 doses). 25 tablet 4   olmesartan (BENICAR) 40 MG tablet Take 40 mg by mouth daily.  3   rosuvastatin (CRESTOR) 10 MG tablet TAKE 1 TABLET BY MOUTH EVERY OTHER DAY 45 tablet 1   No current facility-administered medications for this visit.    Review of Systems: GENERAL: negative for malaise, night sweats HEENT: No changes in hearing or vision, no nose bleeds or other nasal problems. NECK: Negative for lumps, goiter, pain and significant neck swelling RESPIRATORY: Negative for cough, wheezing CARDIOVASCULAR: Negative for chest pain, leg swelling, palpitations, orthopnea GI: SEE HPI MUSCULOSKELETAL: Negative for joint pain or swelling, back pain, and muscle pain. SKIN: Negative for lesions, rash PSYCH: Negative for sleep disturbance, mood disorder and recent psychosocial stressors. HEMATOLOGY Negative for prolonged bleeding, bruising easily, and swollen nodes. ENDOCRINE: Negative for cold or heat intolerance, polyuria, polydipsia and goiter. NEURO: negative for tremor, gait imbalance, syncope and seizures. The remainder of the review of systems is noncontributory.   Physical Exam: BP 114/73 (BP Location: Left Arm, Patient Position: Sitting, Cuff Size: Large)   Pulse 76   Temp (!) 97.5 F (36.4 C) (Oral)   Ht 6' (1.829 m)   Wt 258 lb (117 kg)   BMI 34.99 kg/m  GENERAL: The patient is AO x3, in no acute  distress. HEENT: Head is normocephalic and atraumatic. EOMI are intact. Mouth is well hydrated and without lesions. NECK: Supple. No masses LUNGS: Clear to auscultation. No presence of rhonchi/wheezing/rales. Adequate chest expansion HEART: RRR, normal s1 and s2. ABDOMEN: Soft, nontender, no guarding, no peritoneal signs, and nondistended. BS +. No masses. EXTREMITIES: Without any cyanosis, clubbing, rash, lesions or edema. NEUROLOGIC: AOx3, no focal motor deficit. SKIN: no jaundice, no rashes   Imaging/Labs: as above  I personally reviewed and interpreted the available labs, imaging and endoscopic files.  Impression and Plan: FILMORE MOLYNEUX is a 61 y.o. male with PMH CAD and NSTEMI s/p stents on DAPT, HTN, gout, CKD, who presents for evaluation of dysphagia and GERD.  The patient has presented recurrent episodes of heartburn which are happening almost on a daily basis.  I had a thorough discussion with the patient regarding the etiology of GERD and the possible options for treatment.  Since he is having frequent symptoms,  I advised him to take a PPI on a daily basis which he is agreeable with.  However, given the presence of dysphagia and given his age we will proceed with an EGD to evaluate his symptoms further.  Also, he is due for colorectal cancer screening, for which he will schedule for colonoscopy.  - Schedule EGD and colonoscopy - Start omeprazole 40 mg qday  - Explained presumed etiology of reflux symptoms. Instruction provided in the use of antireflux medication - patient should take medication in the morning 30-45 minutes before eating breakfast. Discussed avoidance of eating within 2 hours of lying down to sleep and benefit of blocks to elevate head of bed. Also, will benefit from avoiding carbonated drinks/sodas or food that has tomatoes, spicy or greasy food. will need to obtain clearance from cardiologist to stop Plavix 5 days before the procedure  All questions were answered.       Seth Peppers, MD Gastroenterology and Hepatology [facility]

## 2022-01-22 NOTE — Patient Instructions (Signed)
Schedule EGD and colonoscopy Start omeprazole 40 mg qday  Explained presumed etiology of reflux symptoms. Instruction provided in the use of antireflux medication - patient should take medication in the morning 30-45 minutes before eating breakfast. Discussed avoidance of eating within 2 hours of lying down to sleep and benefit of blocks to elevate head of bed. Also, will benefit from avoiding carbonated drinks/sodas or food that has tomatoes, spicy or greasy food. will need to obtain clearance from cardiologist to stop Plavix 5 days before the procedure

## 2022-01-22 NOTE — Telephone Encounter (Signed)
Primary Cardiologist:Samuel Domenic Polite, MD  Chart reviewed as part of pre-operative protocol coverage. Because of Alicia E Canady's past medical history and time since last visit, he/she will require a follow-up visit in order to better assess preoperative cardiovascular risk.  Pre-op covering staff: - Patient has an appointment with Ermalinda Barrios, PA on 02/19/22 at which time clearance can be addressed.  - Please contact requesting surgeon's office via preferred method (i.e, phone, fax) to inform them of need for appointment prior to surgery.  Pending no signs of ACS, he may hold Plavix for 5 days prior to procedure and should resume as soon as hemodynamically stable following procedure.   Emmaline Life, NP-C  01/22/2022, 4:47 PM [address] Office [phone] Fax [phone]

## 2022-01-22 NOTE — Telephone Encounter (Signed)
   Pre-operative Risk Assessment    Patient Name: Seth Graves  DOB: [birth year] MRN: [id number]      Request for Surgical Clearance    Procedure:  COLONOSCOPY & UPPER ENDOSCOPY  Date of Surgery:  Clearance 03/23/22                                 Surgeon:  DR. DANIEL CASTANEDA Surgeon's Group or Practice Name:  [facility] Phone number:  [phone] Fax number:  [phone]   Type of Clearance Requested:   - Pharmacy:  Hold Clopidogrel (Plavix) X'S 5 DAYS    Type of Anesthesia:  Not Indicated   Additional requests/questions:    Signed, Jeanann Lewandowsky   01/22/2022, 4:20 PM

## 2022-01-24 NOTE — Telephone Encounter (Signed)
Will route to requesting surgeon's office to make them aware.

## 2022-02-13 NOTE — Progress Notes (Signed)
Cardiology Office Note:    Date:  02/19/2022   ID:  Seth Graves, DOB January 07, 1961, MRN [id number]  PCP:  Sharilyn Sites, [place] Prov[facility] Cardiologist:  Rozann Lesches, MD Cardiology APP:  Murrell Converse     Referring MD: Sharilyn Sites, MD   Chief Complaint:  No chief complaint on file.     History of Present Illness:   Seth Graves is a 61 y.o. male with history of CAD s/p DES to the RCA, ramus intermedius and LCx in 2014, low risk NST 2020, HTN, HLD, arthritis, suspected CKD stage stage III.  I saw the patient 06/2021 with elevated BP's and icreased amlodipine and changed metoprolol to toprol xl.   He needs preop clearance for upper endo and colonoscopy. He's busy taking care of his mother who just moved here from [facility].  Denies chest pain, dyspnea. Not very active. Drives a truck, no regular exercise. He joined a gym but doesn't go Doesn't sleep well-it's his main focus today. Takes a sleep aide-lexapro-doesn't know the dose.BP up today. Usually runs 130/80's. Checks it 1-2 times a week. Drinks 2-3 cups coffee and a soda daily.        Past Medical History:  Diagnosis Date   Arthritis    CKD (chronic kidney disease)    Coronary artery disease    NSTEMI 05/2012 s/p PTCA/DES of the mid RCA, ramus intermediate, and mid left circumflex, LVEF 55-60%   Essential hypertension, benign    Gout 06/11/2012   Hypercholesteremia    Hyperglycemia    NSTEMI (non-ST elevated myocardial infarction) [facility])    February 2014   Current Medications: Current Meds  Medication Sig   allopurinol (ZYLOPRIM) 100 MG tablet Take 100 mg by mouth daily. '400mg'$  total. Takes '300mg'$  plus '100mg'$  daily   amLODipine (NORVASC) 10 MG tablet Take 1 tablet (10 mg total) by mouth daily.   Ascorbic Acid (VITAMIN C) 100 MG CHEW Chew by mouth.   aspirin EC 81 MG EC tablet Take 1 tablet (81 mg total) by mouth daily.   cholecalciferol (VITAMIN D3) 25 MCG (1000 UNIT) tablet Take 1,000 Units by mouth  daily.   clopidogrel (PLAVIX) 75 MG tablet Take 1 tablet by mouth once daily   Escitalopram Oxalate (LEXAPRO PO) Take by mouth.   KRILL OIL PO Take by mouth.   metoprolol succinate (TOPROL-XL) 50 MG 24 hr tablet Take 1 tablet (50 mg total) by mouth daily. Take with or immediately following a meal.   Multiple Vitamin (MULTIVITAMIN) tablet Take 1 tablet by mouth daily.   nitroGLYCERIN (NITROSTAT) 0.4 MG SL tablet Place 1 tablet (0.4 mg total) under the tongue every 5 (five) minutes as needed for chest pain (up to 3 doses).   olmesartan (BENICAR) 40 MG tablet Take 40 mg by mouth daily.   omeprazole (PRILOSEC) 40 MG capsule Take 1 capsule (40 mg total) by mouth daily.   rosuvastatin (CRESTOR) 10 MG tablet TAKE 1 TABLET BY MOUTH EVERY OTHER DAY    Allergies:   Atorvastatin and Meperidine hcl   Social History   Tobacco Use   Smoking status: Former    Types: Cigarettes    Start date: 04/16/1980    Quit date: 04/16/2001    Years since quitting: 20.8   Smokeless tobacco: Never  Vaping Use   Vaping Use: Never used  Substance Use Topics   Alcohol use: No    Alcohol/week: 0.0 standard drinks of alcohol   Drug use: No  Family Hx: The patient's family history includes Alzheimer's disease in his paternal grandmother; Aneurysm in his maternal grandfather; CAD in his father; CVA in his maternal grandmother; Heart attack in his paternal grandfather and paternal grandmother.  ROS     Physical Exam:    VS:  BP 120/82   Pulse (!) 55   Ht 6' (1.829 m)   Wt 260 lb 3.2 oz (118 kg)   SpO2 98%   BMI 35.29 kg/m     Wt Readings from Last 3 Encounters:  02/19/22 260 lb 3.2 oz (118 kg)  01/22/22 258 lb (117 kg)  06/20/21 251 lb 6.4 oz (114 kg)    Physical Exam  GEN: Well nourished, well developed, in no acute distress   Neck: no JVD, carotid bruits, or masses Cardiac:RRR; no murmurs, rubs, or gallops  Respiratory:  clear to auscultation bilaterally, normal work of breathing GI: soft,  nontender, nondistended, + BS Ext: without cyanosis, clubbing, or edema, Good distal pulses bilaterally Neuro:  Alert and Oriented x 3, Strength and sensation are intact Psych: euthymic mood, full affect        EKGs/Labs/Other Test Reviewed:    EKG:  EKG is   ordered today.  The ekg ordered today demonstrates sinus bradycardia 55/m no changes  Recent Labs: No results found for requested labs within last 365 days.   Recent Lipid Panel No results for input(s): "CHOL", "TRIG", "HDL", "VLDL", "LDLCALC", "LDLDIRECT" in the last 8760 hours.   Prior CV Studies:     NST 02/2019 MPI result is not available in the report itself but Dr. Myles Gip result note states: "Please let him know that the stress test was low risk, did not show any large ischemic territories to suggest progressive obstructive CAD."   Cath Catheterization 2014 Cardiac Catheterization Procedure Note   Name: Seth Graves MRN: [id number] DOB: 16-Aug-1960   Procedure: Left Heart Cath, Selective Coronary Angiography, LV angiography, PTCA and stenting of the mid RCA, Ramus intermediate, and mid left circumflex arteries.   Indication: 61 year old white male with history of hypertension, hyperlipidemia, and family history of early coronary disease presents with a non-ST elevation myocardial infarction. He had recurrent chest pain at rest despite optimal medical therapy.   Procedural Details:  The right wrist was prepped, draped, and anesthetized with 1% lidocaine. Using the modified Seldinger technique, a 5 French sheath was introduced into the right radial artery. 3 mg of verapamil was administered through the sheath, weight-based unfractionated heparin was administered intravenously. Standard Judkins catheters were used for selective coronary angiography and left ventriculography. Catheter exchanges were performed over an exchange length guidewire.   PROCEDURAL FINDINGS Hemodynamics: AO 132/85 with a mean of 108 mmHg LV  132/13 mmHg               Coronary angiography: Coronary dominance: right   Left mainstem: Large and normal   Left anterior descending (LAD): The left anterior descending artery is a large vessel extends around the apex. There is a long 30% stenosis in the mid vessel. The first diagonal is without significant disease.   Ramus intermediate: This is a large branch which has a 90-95% stenosis in the proximal vessel.   Left circumflex (LCx): There is a long segmental stenosis in the mid vessel up to 70%.   Right coronary artery (RCA):  The right coronary is a very large dominant vessel. It has mild disease in the proximal vessel up to 20-30%. In the mid vessel there is a focal stenosis  of 90-95% at an acute angulated bend. There is ectasia following this lesion and then a segment of 30% disease distal.   Left ventriculography: Left ventricular systolic function is normal, LVEF is estimated at 55-60%, there is mild mid anterior hypokinesis, there is no significant mitral regurgitation    PCI Note:  Following the diagnostic procedure, the decision was made to proceed with PCI. The radial sheath was upsized to a 6 [place]. Brilinta 180 mg was given orally. Weight-based bivalirudin was given for anticoagulation. Once a therapeutic ACT was achieved, a 6 [place] XB RCA guide catheter was inserted.  A pro-water coronary guidewire was used to cross the lesion in the mid RCA.  The lesion was predilated with a 2.5 mm balloon.  The lesion was then stented with a 4.0 x 28 mm Promus premier stent.  The stent was postdilated with a 4.5 mm noncompliant balloon.  Following PCI, there was 0% residual stenosis and TIMI-3 flow. Final angiography confirmed an excellent result.   We then proceeded with intervention of the ramus intermediate branch. We switched to a XB LAD 3.5 guide. A pro-water coronary guidewire was used to cross the lesion. The lesion was predilated with a 2.5 mm balloon. The lesion was then stented with  a 3.0 x 20 mm Promus premier stent the stent was postdilated with a 3.0 mm noncompliant balloon. Following PCI, there was 0% residual stenosis and TIMI grade 3 flow. Final angiography confirmed an excellent result.   We next proceeded with intervention of the mid left circumflex. This lesion was crossed with the pro-water. It was predilated using a 2.5 mm balloon. It was then stented with a 3.0 x 20 mm Promus premier stent. The stent was postdilated with a 3.0 mm noncompliant balloon. Following PCI, there was 0% residual stenosis and TIMI grade 3 flow. Final angiography confirmed an excellent result. The patient tolerated the procedure well. There were no immediate procedural complications. A TR band was used for radial hemostasis. The patient was transferred to the post catheterization recovery area for further monitoring.   PCI Data: Vessel #1 - RCA/Segment - mid Percent Stenosis (pre)  90-95% TIMI-flow 3 Stent 4.0 x 28 mm Promus premier Percent Stenosis (post) 0% TIMI-flow (post) 3   Vessel #2-ramus intermediate/proximal Percent stenoses: 90-95% TIMI flow 3 Stent: 3.0 x 20 mm Promus premier Percent stenoses (post): 0% TIMI flow: 3   Vessel #3-left circumflex/mid Percent stenoses: 70% TIMI flow 3 Stent: 3.0 x 20 mm Promus premier Percent stenoses (post) 0% TIMI flow: 3   Final Conclusions:   1. Three-vessel obstructive coronary disease. 2. Good left ventricular function. 3. Successful stenting of the mid RCA, proximal ramus intermediate, and mid left circumflex with drug-eluting stents.    Recommendations:  Risk factor modification. Recommend dual antiplatelet therapy for one year.   [facility] 06/04/2012, 10:49 AM          Risk Assessment/Calculations/Metrics:              ASSESSMENT & PLAN:   No problem-specific Assessment & Plan notes found for this encounter.   Preop clearance for colonoscopy and endoscopy 03/23/22 by Dr. Maylon Peppers. Denies chest  pain or cardiac complaints. METs >7.  Can hold plavix for 5 days  According to the Revised Cardiac Risk Index (RCRI), his Perioperative Risk of Major Cardiac Event is (%): 0.9  His Functional Capacity in METs is: 7.59 according to the [facility] Activity Status Index (DASI).   CAD status post DES to the  RCA, ramus intermedius and circumflex in 2014, low risk NST 2020. No chest pain. No regular exercise because of  working nights. 150 min exercise weekly. Continue plavix and ASA, toprol, crestor benicar. 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    Hypertension BP up initially but better on recheck. 2 gm sodium diet.   HLD LDL goal less than 70, previous myalgias but tolerating rosuvastatin 10 mg every other day. Due for recheck.   CKD stage III-seeing kidney specialist regularly              Dispo:  No follow-ups on file.   Medication Adjustments/Labs and Tests Ordered: Current medicines are reviewed at length with the patient today.  Concerns regarding medicines are outlined above.  Tests Ordered: Orders Placed This Encounter  Procedures   Lipid panel   CBC   Comprehensive Metabolic Panel (CMET)   TSH   EKG 12-Lead   Medication Changes: No orders of the defined types were placed in this encounter.  Signed, Ermalinda Barrios, PA-C  02/19/2022 12:48 PM    [address], [place], [place]  [address] Phone: [phone]; Fax: [phone]

## 2022-02-19 ENCOUNTER — Encounter: Payer: Self-pay | Admitting: Physician Assistant

## 2022-02-19 ENCOUNTER — Ambulatory Visit: Payer: Managed Care, Other (non HMO) | Admitting: Physician Assistant

## 2022-02-19 VITALS — BP 120/82 | HR 55 | Ht 72.0 in | Wt 260.2 lb

## 2022-02-19 DIAGNOSIS — I2510 Atherosclerotic heart disease of native coronary artery without angina pectoris: Secondary | ICD-10-CM | POA: Diagnosis not present

## 2022-02-19 DIAGNOSIS — E785 Hyperlipidemia, unspecified: Secondary | ICD-10-CM | POA: Diagnosis not present

## 2022-02-19 DIAGNOSIS — N183 Chronic kidney disease, stage 3 unspecified: Secondary | ICD-10-CM

## 2022-02-19 DIAGNOSIS — I10 Essential (primary) hypertension: Secondary | ICD-10-CM | POA: Diagnosis not present

## 2022-02-19 NOTE — Patient Instructions (Signed)
Medication Instructions:  Your physician recommends that you continue on your current medications as directed. Please refer to the Current Medication list given to you today.  *If you need a refill on your cardiac medications before your next appointment, please call your pharmacy*   Lab Work: Your physician recommends that you return for lab work Fasting   If you have labs (blood work) drawn today and your tests are completely normal, you will receive your results only by: MyChart Message (if you have MyChart) OR A paper copy in the mail If you have any lab test that is abnormal or we need to change your treatment, we will call you to review the results.   Testing/Procedures: NONE    Follow-Up: At [facility], you and your health needs are our priority.  As part of our continuing mission to provide you with exceptional heart care, we have created designated Provider Care Teams.  These Care Teams include your primary Cardiologist (physician) and Advanced Practice Providers (APPs -  Physician Assistants and Nurse Practitioners) who all work together to provide you with the care you need, when you need it.  We recommend signing up for the patient portal called "MyChart".  Sign up information is provided on this After Visit Summary.  MyChart is used to connect with patients for Virtual Visits (Telemedicine).  Patients are able to view lab/test results, encounter notes, upcoming appointments, etc.  Non-urgent messages can be sent to your provider as well.   To learn more about what you can do with MyChart, go to [email or web address].    Your next appointment:   6 month(s)  The format for your next appointment:   In Person  Provider:   Rozann Lesches, MD    Other Instructions Please complete 150 minutes of Exercise per week.  Two Gram Sodium Diet 2000 mg  What is Sodium? Sodium is a mineral found naturally in many foods. The most significant source of sodium in the diet  is table salt, which is about 40% sodium.  Processed, convenience, and preserved foods also contain a large amount of sodium.  The body needs only 500 mg of sodium daily to function,  A normal diet provides more than enough sodium even if you do not use salt.  Why Limit Sodium? A build up of sodium in the body can cause thirst, increased blood pressure, shortness of breath, and water retention.  Decreasing sodium in the diet can reduce edema and risk of heart attack or stroke associated with high blood pressure.  Keep in mind that there are many other factors involved in these health problems.  Heredity, obesity, lack of exercise, cigarette smoking, stress and what you eat all play a role.  General Guidelines: Do not add salt at the table or in cooking.  One teaspoon of salt contains over 2 grams of sodium. Read food labels Avoid processed and convenience foods Ask your dietitian before eating any foods not dicussed in the menu planning guidelines Consult your physician if you wish to use a salt substitute or a sodium containing medication such as antacids.  Limit milk and milk products to 16 oz (2 cups) per day.  Shopping Hints: READ LABELS!! "Dietetic" does not necessarily mean low sodium. Salt and other sodium ingredients are often added to foods during processing.    Menu Planning Guidelines Food Group Choose More Often Avoid  Beverages (see also the milk group All fruit juices, low-sodium, salt-free vegetables juices, low-sodium carbonated beverages Regular vegetable or  tomato juices, commercially softened water used for drinking or cooking  Breads and Cereals Enriched white, wheat, rye and pumpernickel bread, hard rolls and dinner rolls; muffins, cornbread and waffles; most dry cereals, cooked cereal without added salt; unsalted crackers and breadsticks; low sodium or homemade bread crumbs Bread, rolls and crackers with salted tops; quick breads; instant hot cereals; pancakes; commercial  bread stuffing; self-rising flower and biscuit mixes; regular bread crumbs or cracker crumbs  Desserts and Sweets Desserts and sweets mad with mild should be within allowance Instant pudding mixes and cake mixes  Fats Butter or margarine; vegetable oils; unsalted salad dressings, regular salad dressings limited to 1 Tbs; light, sour and heavy cream Regular salad dressings containing bacon fat, bacon bits, and salt pork; snack dips made with instant soup mixes or processed cheese; salted nuts  Fruits Most fresh, frozen and canned fruits Fruits processed with salt or sodium-containing ingredient (some dried fruits are processed with sodium sulfites        Vegetables Fresh, frozen vegetables and low- sodium canned vegetables Regular canned vegetables, sauerkraut, pickled vegetables, and others prepared in brine; frozen vegetables in sauces; vegetables seasoned with ham, bacon or salt pork  Condiments, Sauces, Miscellaneous  Salt substitute with physician's approval; pepper, herbs, spices; vinegar, lemon or lime juice; hot pepper sauce; garlic powder, onion powder, low sodium soy sauce (1 Tbs.); low sodium condiments (ketchup, chili sauce, mustard) in limited amounts (1 tsp.) fresh ground horseradish; unsalted tortilla chips, pretzels, potato chips, popcorn, salsa (1/4 cup) Any seasoning made with salt including garlic salt, celery salt, onion salt, and seasoned salt; sea salt, rock salt, kosher salt; meat tenderizers; monosodium glutamate; mustard, regular soy sauce, barbecue, sauce, chili sauce, teriyaki sauce, steak sauce, Worcestershire sauce, and most flavored vinegars; canned gravy and mixes; regular condiments; salted snack foods, olives, picles, relish, horseradish sauce, catsup   Food preparation: Try these seasonings Meats:    Pork Sage, onion Serve with applesauce  Chicken Poultry seasoning, thyme, parsley Serve with cranberry sauce  Lamb Curry powder, rosemary, garlic, thyme Serve with mint  sauce or jelly  Veal Marjoram, basil Serve with current jelly, cranberry sauce  Beef Pepper, bay leaf Serve with dry mustard, unsalted chive butter  Fish [place] leaf, dill Serve with unsalted lemon butter, unsalted parsley butter  Vegetables:    Asparagus Lemon juice   Broccoli Lemon juice   Carrots Mustard dressing parsley, mint, nutmeg, glazed with unsalted butter and sugar   Green beans Marjoram, lemon juice, nutmeg,dill seed   Tomatoes Basil, marjoram, onion   Spice /blend for [employer]" 4 tsp ground thyme 1 tsp ground sage 3 tsp ground rosemary 4 tsp ground marjoram   Test your knowledge A product that says "Salt Free" may still contain sodium. True or False Garlic Powder and Hot Pepper Sauce an be used as alternative seasonings.True or False Processed foods have more sodium than fresh foods.  True or False Canned Vegetables have less sodium than froze True or False   WAYS TO DECREASE YOUR SODIUM INTAKE Avoid the use of added salt in cooking and at the table.  Table salt (and other prepared seasonings which contain salt) is probably one of the greatest sources of sodium in the diet.  Unsalted foods can gain flavor from the sweet, sour, and butter taste sensations of herbs and spices.  Instead of using salt for seasoning, try the following seasonings with the foods listed.  Remember: how you use them to enhance natural food flavors is limited  only by your creativity... Allspice-Meat, fish, eggs, fruit, peas, red and yellow vegetables Almond Extract-Fruit baked goods Anise Seed-Sweet breads, fruit, carrots, beets, cottage cheese, cookies (tastes like licorice) Basil-Meat, fish, eggs, vegetables, rice, vegetables salads, soups, sauces Bay Leaf-Meat, fish, stews, poultry Burnet-Salad, vegetables (cucumber-like flavor) Caraway Seed-Bread, cookies, cottage cheese, meat, vegetables, cheese, rice Cardamon-Baked goods, fruit, soups Celery Powder or seed-Salads, salad dressings, sauces,  meatloaf, soup, bread.Do not use  celery salt Chervil-Meats, salads, fish, eggs, vegetables, cottage cheese (parsley-like flavor) Chili Power-Meatloaf, chicken cheese, corn, eggplant, egg dishes Chives-Salads cottage cheese, egg dishes, soups, vegetables, sauces Cilantro-Salsa, casseroles Cinnamon-Baked goods, fruit, pork, lamb, chicken, carrots Cloves-Fruit, baked goods, fish, pot roast, green beans, beets, carrots Coriander-Pastry, cookies, meat, salads, cheese (lemon-orange flavor) Cumin-Meatloaf, fish,cheese, eggs, cabbage,fruit pie (caraway flavor) [employer], fruit, eggs, fish, poultry, cottage cheese, vegetables Dill Seed-Meat, cottage cheese, poultry, vegetables, fish, salads, bread Fennel Seed-Bread, cookies, apples, pork, eggs, fish, beets, cabbage, cheese, Licorice-like flavor Garlic-(buds or powder) Salads, meat, poultry, fish, bread, butter, vegetables, potatoes.Do not  use garlic salt Ginger-Fruit, vegetables, baked goods, meat, fish, poultry Horseradish Root-Meet, vegetables, butter Lemon Juice or Extract-Vegetables, fruit, tea, baked goods, fish salads Mace-Baked goods fruit, vegetables, fish, poultry (taste like nutmeg) Maple Extract-Syrups Marjoram-Meat, chicken, fish, vegetables, breads, green salads (taste like Sage) Mint-Tea, lamb, sherbet, vegetables, desserts, carrots, cabbage Mustard, Dry or Seed-Cheese, eggs, meats, vegetables, poultry Nutmeg-Baked goods, fruit, chicken, eggs, vegetables, desserts Onion Powder-Meat, fish, poultry, vegetables, cheese, eggs, bread, rice salads (Do not use   Onion salt) Orange Extract-Desserts, baked goods Oregano-Pasta, eggs, cheese, onions, pork, lamb, fish, chicken, vegetables, green salads Paprika-Meat, fish, poultry, eggs, cheese, vegetables Parsley Flakes-Butter, vegetables, meat fish, poultry, eggs, bread, salads (certain forms may   Contain sodium Pepper-Meat fish, poultry, vegetables, eggs Peppermint  Extract-Desserts, baked goods Poppy Seed-Eggs, bread, cheese, fruit dressings, baked goods, noodles, vegetables, cottage  [employer], poultry, meat, fish, cauliflower, turnips,eggs bread Saffron-Rice, bread, veal, chicken, fish, eggs Sage-Meat, fish, poultry, onions, eggplant, tomateos, pork, stews Savory-Eggs, salads, poultry, meat, rice, vegetables, soups, pork Tarragon-Meat, poultry, fish, eggs, butter, vegetables (licorice-like flavor)  Thyme-Meat, poultry, fish, eggs, vegetables, (clover-like flavor), sauces, soups Tumeric-Salads, butter, eggs, fish, rice, vegetables (saffron-like flavor) Vanilla Extract-Baked goods, candy Vinegar-Salads, vegetables, meat marinades Walnut Extract-baked goods, candy   2. Choose your Foods Wisely   The following is a list of foods to avoid which are high in sodium:  Meats-Avoid all smoked, canned, salt cured, dried and kosher meat and fish as well as Anchovies   [employer] meats:Bologna, Liverwurst, Pastrami Canned meat or fish  Marinated herring Caviar    Pepperoni Corned Beef   Pizza Dried chipped beef  Salami Frozen breaded fish or meat Salt pork Frankfurters or hot dogs  Sardines Gefilte fish   Sausage Ham (boiled ham, Proscuitto Smoked butt    spiced ham)   Spam      TV Dinners Vegetables Canned vegetables (Regular) Relish Canned mushrooms  Sauerkraut Olives    Tomato juice Pickles  Bakery and Dessert Products Canned puddings  Cream pies Cheesecake   Decorated cakes Cookies  Beverages/Juices Tomato juice, regular  Gatorade   V-8 vegetable juice, regular  Breads and Cereals Biscuit mixes   Salted potato chips, corn chips, pretzels Bread stuffing mixes  Salted crackers and rolls Pancake and waffle mixes Self-rising flour  Seasonings Accent    Meat sauces Barbecue sauce  Meat tenderizer Catsup    Monosodium glutamate (MSG) Celery salt   Onion  salt Chili sauce   Prepared  mustard Garlic salt   Salt, seasoned salt, sea salt Gravy mixes   Soy sauce Horseradish   Steak sauce Ketchup   Tartar sauce Lite salt    Teriyaki sauce Marinade mixes   Worcestershire sauce  Others Baking powder   Cocoa and cocoa mixes Baking soda   Commercial casserole mixes Candy-caramels, chocolate  Dehydrated soups    Bars, fudge,nougats  Instant rice and pasta mixes Canned broth or soup  Maraschino cherries Cheese, aged and processed cheese and cheese spreads  Learning Assessment Quiz  Indicated T (for True) or F (for False) for each of the following statements:  _____ Fresh fruits and vegetables and unprocessed grains are generally low in sodium _____ Water may contain a considerable amount of sodium, depending on the source _____ You can always tell if a food is high in sodium by tasting it _____ Certain laxatives my be high in sodium and should be avoided unless prescribed   by a physician or pharmacist _____ Salt substitutes may be used freely by anyone on a sodium restricted diet _____ Sodium is present in table salt, food additives and as a natural component of   most foods _____ Table salt is approximately 90% sodium _____ Limiting sodium intake may help prevent excess fluid accumulation in the body _____ On a sodium-restricted diet, seasonings such as bouillon soy sauce, and    cooking wine should be used in place of table salt _____ On an ingredient list, a product which lists monosodium glutamate as the first   ingredient is an appropriate food to include on a low sodium diet  Circle the best answer(s) to the following statements (Hint: there may be more than one correct answer)  11. On a low-sodium diet, some acceptable snack items are:    A. Olives  F. Bean dip   K. Grapefruit juice    B. Salted Pretzels G. Commercial Popcorn   L. Canned peaches    C. Carrot Sticks  H. Bouillon   M. Unsalted nuts   D. [place] fries  I. Peanut butter crackers N. Salami   E.  Sweet pickles J. Tomato Juice   O. Pizza  12.  Seasonings that may be used freely on a reduced - sodium diet include   A. Lemon wedges F.Monosodium glutamate K. Celery seed    B.Soysauce   G. Pepper   L. Mustard powder   C. Sea salt  H. Cooking wine  M. Onion flakes   D. Vinegar  E. Prepared horseradish N. Salsa   E. Sage   J. Worcestershire sauce  O. Chutney   Important Information About Sugar

## 2022-02-19 NOTE — Progress Notes (Signed)
Note sent to Dr. Jenetta Downer as Darius Bump has already scheduled patient and made aware to hold plavix

## 2022-02-21 ENCOUNTER — Other Ambulatory Visit (HOSPITAL_COMMUNITY)
Admission: RE | Admit: 2022-02-21 | Discharge: 2022-02-21 | Disposition: A | Discharge status: Home / Self Care | Payer: Managed Care, Other (non HMO) | Source: Ambulatory Visit | Attending: Physician Assistant | Admitting: Physician Assistant

## 2022-02-21 DIAGNOSIS — I10 Essential (primary) hypertension: Secondary | ICD-10-CM | POA: Diagnosis present

## 2022-02-21 DIAGNOSIS — I2510 Atherosclerotic heart disease of native coronary artery without angina pectoris: Secondary | ICD-10-CM | POA: Diagnosis present

## 2022-02-21 DIAGNOSIS — E785 Hyperlipidemia, unspecified: Secondary | ICD-10-CM | POA: Diagnosis present

## 2022-02-21 LAB — CBC
HCT: 40.7 % (ref 39.0–52.0)
Hemoglobin: 13.9 g/dL (ref 13.0–17.0)
MCH: 29.9 pg (ref 26.0–34.0)
MCHC: 34.2 g/dL (ref 30.0–36.0)
MCV: 87.5 fL (ref 80.0–100.0)
Platelets: 259 10*3/uL (ref 150–400)
RBC: 4.65 MIL/uL (ref 4.22–5.81)
RDW: 13.8 % (ref 11.5–15.5)
WBC: 6.8 10*3/uL (ref 4.0–10.5)
nRBC: 0 % (ref 0.0–0.2)

## 2022-02-21 LAB — COMPREHENSIVE METABOLIC PANEL
ALT: 38 U/L (ref 0–44)
AST: 35 U/L (ref 15–41)
Albumin: 3.9 g/dL (ref 3.5–5.0)
Alkaline Phosphatase: 50 U/L (ref 38–126)
Anion gap: 7 (ref 5–15)
BUN: 13 mg/dL (ref 6–20)
CO2: 28 mmol/L (ref 22–32)
Calcium: 8.9 mg/dL (ref 8.9–10.3)
Chloride: 104 mmol/L (ref 98–111)
Creatinine, Ser: 1.43 mg/dL — ABNORMAL HIGH (ref 0.61–1.24)
GFR, Estimated: 56 mL/min — ABNORMAL LOW (ref >60)
Glucose, Bld: 124 mg/dL — ABNORMAL HIGH (ref 70–99)
Potassium: 4.2 mmol/L (ref 3.5–5.1)
Sodium: 139 mmol/L (ref 135–145)
Total Bilirubin: 0.5 mg/dL (ref 0.3–1.2)
Total Protein: 7.2 g/dL (ref 6.5–8.1)

## 2022-02-21 LAB — LIPID PANEL
Cholesterol: 211 mg/dL — ABNORMAL HIGH (ref 0–200)
HDL: 36 mg/dL — ABNORMAL LOW (ref >40)
LDL Cholesterol: UNDETERMINED mg/dL (ref 0–99)
Total CHOL/HDL Ratio: 5.9 RATIO
Triglycerides: 573 mg/dL — ABNORMAL HIGH (ref <150)
VLDL: UNDETERMINED mg/dL (ref 0–40)

## 2022-02-21 LAB — LDL CHOLESTEROL, DIRECT: Direct LDL: 92 mg/dL (ref 0–99)

## 2022-02-21 LAB — TSH: TSH: 1.154 u[IU]/mL (ref 0.350–4.500)

## 2022-02-22 ENCOUNTER — Telehealth: Payer: Self-pay

## 2022-02-22 DIAGNOSIS — Z79899 Other long term (current) drug therapy: Secondary | ICD-10-CM

## 2022-02-22 NOTE — Telephone Encounter (Signed)
-----   Message from Imogene Burn, PA-C sent at 02/22/2022  8:25 AM EST ----- Triglycerides very high and can't calculate LDL. Please refer to lipid clinic. Crt up but he sees a kidney specialist. Reduce simple sugars and sweets in diet. Should be checked for diabetes by PCP. No other changes

## 2022-03-05 ENCOUNTER — Ambulatory Visit (INDEPENDENT_AMBULATORY_CARE_PROVIDER_SITE_OTHER): Payer: Managed Care, Other (non HMO) | Admitting: Gastroenterology

## 2022-03-22 ENCOUNTER — Telehealth: Payer: Self-pay | Admitting: *Deleted

## 2022-03-22 NOTE — Telephone Encounter (Signed)
PA approved via [employer] for EGD. Auth# [id number], valid until 09/18/22

## 2022-03-23 ENCOUNTER — Other Ambulatory Visit: Payer: Self-pay

## 2022-03-23 ENCOUNTER — Ambulatory Visit (HOSPITAL_COMMUNITY): Payer: Managed Care, Other (non HMO) | Admitting: Anesthesiology

## 2022-03-23 ENCOUNTER — Encounter (HOSPITAL_COMMUNITY): Payer: Self-pay | Admitting: Gastroenterology

## 2022-03-23 ENCOUNTER — Encounter (HOSPITAL_COMMUNITY)
Admission: RE | Disposition: A | Discharge status: Home / Self Care | Payer: Self-pay | Source: Ambulatory Visit | Attending: Gastroenterology

## 2022-03-23 ENCOUNTER — Ambulatory Visit (HOSPITAL_COMMUNITY)
Admission: RE | Admit: 2022-03-23 | Discharge: 2022-03-23 | Disposition: A | Discharge status: Home / Self Care | Payer: Managed Care, Other (non HMO) | Source: Ambulatory Visit | Attending: Gastroenterology | Admitting: Gastroenterology

## 2022-03-23 DIAGNOSIS — K449 Diaphragmatic hernia without obstruction or gangrene: Secondary | ICD-10-CM

## 2022-03-23 DIAGNOSIS — G4730 Sleep apnea, unspecified: Secondary | ICD-10-CM | POA: Diagnosis not present

## 2022-03-23 DIAGNOSIS — E785 Hyperlipidemia, unspecified: Secondary | ICD-10-CM | POA: Diagnosis not present

## 2022-03-23 DIAGNOSIS — I2510 Atherosclerotic heart disease of native coronary artery without angina pectoris: Secondary | ICD-10-CM | POA: Diagnosis not present

## 2022-03-23 DIAGNOSIS — K219 Gastro-esophageal reflux disease without esophagitis: Secondary | ICD-10-CM

## 2022-03-23 DIAGNOSIS — Z87891 Personal history of nicotine dependence: Secondary | ICD-10-CM | POA: Diagnosis not present

## 2022-03-23 DIAGNOSIS — I129 Hypertensive chronic kidney disease with stage 1 through stage 4 chronic kidney disease, or unspecified chronic kidney disease: Secondary | ICD-10-CM | POA: Insufficient documentation

## 2022-03-23 DIAGNOSIS — K2289 Other specified disease of esophagus: Secondary | ICD-10-CM

## 2022-03-23 DIAGNOSIS — M109 Gout, unspecified: Secondary | ICD-10-CM | POA: Insufficient documentation

## 2022-03-23 DIAGNOSIS — R1310 Dysphagia, unspecified: Secondary | ICD-10-CM | POA: Diagnosis not present

## 2022-03-23 DIAGNOSIS — K635 Polyp of colon: Secondary | ICD-10-CM | POA: Diagnosis not present

## 2022-03-23 DIAGNOSIS — Z7902 Long term (current) use of antithrombotics/antiplatelets: Secondary | ICD-10-CM | POA: Insufficient documentation

## 2022-03-23 DIAGNOSIS — Z955 Presence of coronary angioplasty implant and graft: Secondary | ICD-10-CM | POA: Insufficient documentation

## 2022-03-23 DIAGNOSIS — Z1211 Encounter for screening for malignant neoplasm of colon: Secondary | ICD-10-CM | POA: Diagnosis not present

## 2022-03-23 DIAGNOSIS — K5730 Diverticulosis of large intestine without perforation or abscess without bleeding: Secondary | ICD-10-CM

## 2022-03-23 DIAGNOSIS — D123 Benign neoplasm of transverse colon: Secondary | ICD-10-CM | POA: Diagnosis not present

## 2022-03-23 DIAGNOSIS — I252 Old myocardial infarction: Secondary | ICD-10-CM | POA: Diagnosis not present

## 2022-03-23 DIAGNOSIS — N183 Chronic kidney disease, stage 3 unspecified: Secondary | ICD-10-CM | POA: Diagnosis not present

## 2022-03-23 HISTORY — PX: COLONOSCOPY WITH PROPOFOL: SHX5780

## 2022-03-23 HISTORY — PX: POLYPECTOMY: SHX5525

## 2022-03-23 HISTORY — PX: ESOPHAGOGASTRODUODENOSCOPY (EGD) WITH PROPOFOL: SHX5813

## 2022-03-23 LAB — HM COLONOSCOPY

## 2022-03-23 SURGERY — COLONOSCOPY WITH PROPOFOL
Anesthesia: General

## 2022-03-23 MED ORDER — PROPOFOL 10 MG/ML IV BOLUS
INTRAVENOUS | Status: DC | PRN
  Administered 2022-03-23: 110 mg via INTRAVENOUS

## 2022-03-23 MED ORDER — LIDOCAINE HCL (CARDIAC) PF 100 MG/5ML IV SOSY
PREFILLED_SYRINGE | INTRAVENOUS | Status: DC | PRN
  Administered 2022-03-23: 50 mg via INTRATRACHEAL

## 2022-03-23 MED ORDER — PROPOFOL 500 MG/50ML IV EMUL
INTRAVENOUS | Status: DC | PRN
  Administered 2022-03-23: 200 ug/kg/min via INTRAVENOUS

## 2022-03-23 MED ORDER — LACTATED RINGERS IV SOLN: INTRAVENOUS | Status: DC

## 2022-03-23 NOTE — Anesthesia Preprocedure Evaluation (Addendum)
Anesthesia Evaluation  Patient identified by MRN, date of birth, ID band Patient awake    Reviewed: Allergy & Precautions, H&P , NPO status , Patient's Chart, lab work & pertinent test results, reviewed documented beta blocker date and time   Airway Mallampati: III  TM Distance: >3 FB Neck ROM: Full    Dental  (+) Dental Advisory Given, Teeth Intact   Pulmonary sleep apnea and Continuous Positive Airway Pressure Ventilation , former smoker   Pulmonary exam normal breath sounds clear to auscultation       Cardiovascular Exercise Tolerance: Good hypertension, Pt. on home beta blockers and Pt. on medications + CAD, + Past MI and + Cardiac Stents (9 years ago)  Normal cardiovascular exam Rhythm:Regular Rate:Normal     Neuro/Psych negative neurological ROS  negative psych ROS   GI/Hepatic Neg liver ROS,GERD  Medicated and Controlled,,  Endo/Other  negative endocrine ROS    Renal/GU Renal InsufficiencyRenal disease  negative genitourinary   Musculoskeletal  (+) Arthritis ,    Abdominal   Peds negative pediatric ROS (+)  Hematology negative hematology ROS (+)   Anesthesia Other Findings   Reproductive/Obstetrics negative OB ROS                             Anesthesia Physical Anesthesia Plan  ASA: 3  Anesthesia Plan: General   Post-op Pain Management: Minimal or no pain anticipated   Induction: Intravenous  PONV Risk Score and Plan:   Airway Management Planned: Nasal Cannula and Natural Airway  Additional Equipment:   Intra-op Plan:   Post-operative Plan:   Informed Consent: I have reviewed the patients History and Physical, chart, labs and discussed the procedure including the risks, benefits and alternatives for the proposed anesthesia with the patient or authorized representative who has indicated his/her understanding and acceptance.     Dental advisory given  Plan  Discussed with: CRNA  Anesthesia Plan Comments:        Anesthesia Quick Evaluation

## 2022-03-23 NOTE — H&P (Signed)
Seth Graves is an 61 y.o. male.   Chief Complaint:  GERD, dysphagia, CRC screening HPI: Seth Graves is a 61 y.o. male with PMH CAD and NSTEMI s/p stents on DAPT, HTN, gout, CKD, who presents for CRC screening, dysphagia and GERD.   The patient denies having any nausea, vomiting, fever, chills, hematochezia, melena, hematemesis, abdominal distention, abdominal pain, diarrhea, jaundice, pruritus or weight loss. States heartburn and dysphagia have resolved after starting PPI.  Past Medical History:  Diagnosis Date   Arthritis    CKD (chronic kidney disease)    Coronary artery disease    NSTEMI 05/2012 s/p PTCA/DES of the mid RCA, ramus intermediate, and mid left circumflex, LVEF 55-60%   Essential hypertension, benign    Gout 06/11/2012   Hypercholesteremia    Hyperglycemia    NSTEMI (non-ST elevated myocardial infarction) [facility])    February 2014    Past Surgical History:  Procedure Laterality Date   BACK SURGERY     HERNIA REPAIR     LEFT HEART CATHETERIZATION WITH CORONARY ANGIOGRAM N/A 06/04/2012   Procedure: LEFT HEART CATHETERIZATION WITH CORONARY ANGIOGRAM;  Surgeon: Peter M Martinique, MD;  Location: [facility] CATH LAB;  Service: Cardiovascular;  Laterality: N/A;   NECK SURGERY     PERCUTANEOUS CORONARY STENT INTERVENTION (PCI-S)  06/04/2012   Procedure: PERCUTANEOUS CORONARY STENT INTERVENTION (PCI-S);  Surgeon: Peter M Martinique, MD;  Location: [facility] CATH LAB;  Service: Cardiovascular;;    Family History  Problem Relation Age of Onset   CAD Father        MI in his 61s   CVA Maternal Grandmother    Aneurysm Maternal Grandfather    Heart attack Paternal Grandmother    Alzheimer's disease Paternal Grandmother    Heart attack Paternal Grandfather    Social History:  reports that he quit smoking about 20 years ago. His smoking use included cigarettes. He started smoking about 41 years ago. He has never used smokeless tobacco. He reports that he does not drink alcohol and does not use  drugs.  Allergies:  Allergies  Allergen Reactions   Lipitor [Atorvastatin] Other (See Comments)    myalgias   Demerol [Meperidine Hcl] Nausea And Vomiting    Medications Prior to Admission  Medication Sig Dispense Refill   acetaminophen (TYLENOL) 500 MG tablet Take 500-1,000 mg by mouth every 6 (six) hours as needed (pain.).     allopurinol (ZYLOPRIM) 100 MG tablet Take 100 mg by mouth at bedtime. 100 mg + 300 mg=400 mg at night     allopurinol (ZYLOPRIM) 300 MG tablet Take 300 mg by mouth at bedtime. 300 mg + 100 mg=400 mg at night     amLODipine (NORVASC) 10 MG tablet Take 1 tablet (10 mg total) by mouth daily. 90 tablet 3   Ascorbic Acid (VITAMIN C) 500 MG CHEW Chew 500 mg by mouth at bedtime.     aspirin EC 81 MG EC tablet Take 1 tablet (81 mg total) by mouth daily.     cholecalciferol (VITAMIN D3) 25 MCG (1000 UNIT) tablet Take 1,000 Units by mouth daily.     escitalopram (LEXAPRO) 10 MG tablet Take 10 mg by mouth in the morning.     KRILL OIL PO Take 1 capsule by mouth at bedtime.     metoprolol succinate (TOPROL-XL) 50 MG 24 hr tablet Take 1 tablet (50 mg total) by mouth daily. Take with or immediately following a meal. 90 tablet 3   Multiple Vitamin (MULTIVITAMIN) tablet Take  1 tablet by mouth at bedtime.     olmesartan (BENICAR) 40 MG tablet Take 40 mg by mouth daily.  3   omeprazole (PRILOSEC) 40 MG capsule Take 1 capsule (40 mg total) by mouth daily. 90 capsule 3   rosuvastatin (CRESTOR) 10 MG tablet TAKE 1 TABLET BY MOUTH EVERY OTHER DAY 45 tablet 1   clopidogrel (PLAVIX) 75 MG tablet Take 1 tablet by mouth once daily 90 tablet 1   nitroGLYCERIN (NITROSTAT) 0.4 MG SL tablet Place 1 tablet (0.4 mg total) under the tongue every 5 (five) minutes as needed for chest pain (up to 3 doses). 25 tablet 4   polyethylene glycol-electrolytes (TRILYTE) 420 g solution Take 4,000 mLs by mouth as directed. (Patient not taking: Reported on 02/19/2022) 4000 mL 0    No results found for this  or any previous visit (from the past 48 hour(s)). No results found.  Review of Systems  All other systems reviewed and are negative.   Blood pressure (!) 158/89, pulse 67, temperature 98 F (36.7 C), temperature source Oral, resp. rate 15, height 6' (1.829 m), weight 117.9 kg, SpO2 97 %. Physical Exam  GENERAL: The patient is AO x3, in no acute distress. HEENT: Head is normocephalic and atraumatic. EOMI are intact. Mouth is well hydrated and without lesions. NECK: Supple. No masses LUNGS: Clear to auscultation. No presence of rhonchi/wheezing/rales. Adequate chest expansion HEART: RRR, normal s1 and s2. ABDOMEN: Soft, nontender, no guarding, no peritoneal signs, and nondistended. BS +. No masses. EXTREMITIES: Without any cyanosis, clubbing, rash, lesions or edema. NEUROLOGIC: AOx3, no focal motor deficit. SKIN: no jaundice, no rashes  Assessment/Plan Seth Graves is a 61 y.o. male with PMH CAD and NSTEMI s/p stents on DAPT, HTN, gout, CKD, who presents for CRC screening, dysphagia and GERD. We will proceed with colonoscopy andf EGD.  Harvel Quale, MD 03/23/2022, 7:39 AM

## 2022-03-23 NOTE — Transfer of Care (Signed)
Immediate Anesthesia Transfer of Care Note  Patient: Dixie Jafri Pricer  Procedure(s) Performed: COLONOSCOPY WITH PROPOFOL ESOPHAGOGASTRODUODENOSCOPY (EGD) WITH PROPOFOL POLYPECTOMY  Patient Location: Endoscopy Unit  Anesthesia Type:General  Level of Consciousness: sedated and patient cooperative  Airway & Oxygen Therapy: Patient Spontanous Breathing  Post-op Assessment: Report given to RN, Post -op Vital signs reviewed and stable, and Patient moving all extremities  Post vital signs: Reviewed and stable  Last Vitals:  Vitals Value Taken Time  BP 85/50 03/23/22 0829  Temp 36.5 C 03/23/22 0829  Pulse 58 03/23/22 0829  Resp 16 03/23/22 0829  SpO2 94 % 03/23/22 0829    Last Pain:  Vitals:   03/23/22 0829  TempSrc: Oral  PainSc:       Patients Stated Pain Goal: 7 (98/02/21 7981)  Complications: No notable events documented.

## 2022-03-23 NOTE — Op Note (Signed)
[facility] Patient Name: Seth Graves Procedure Date: 03/23/2022 7:54 AM MRN: [id number] Date of Birth: 1961/01/22 Attending MD: Maylon Peppers , , [id number] CSN: [id number] Age: 61 Admit Type: Outpatient Procedure:                Colonoscopy Indications:              Screening for colorectal malignant neoplasm Providers:                Maylon Peppers, Lambert Mody, Randa Spike, Technician Referring MD:             Halford Chessman MD, MD Medicines:                Monitored Anesthesia Care Complications:            No immediate complications. Estimated Blood Loss:     Estimated blood loss: none. Procedure:                Pre-Anesthesia Assessment:                           - Prior to the procedure, a History and Physical                            was performed, and patient medications, allergies                            and sensitivities were reviewed. The patient's                            tolerance of previous anesthesia was reviewed.                           - The risks and benefits of the procedure and the                            sedation options and risks were discussed with the                            patient. All questions were answered and informed                            consent was obtained.                           - ASA Grade Assessment: II - A patient with mild                            systemic disease.                           After obtaining informed consent, the colonoscope                            was passed under direct vision. Throughout the  procedure, the patient's blood pressure, pulse, and                            oxygen saturations were monitored continuously. The                            [id number]-HQ190L ([id number] scope was introduced through                            the anus and advanced to the the cecum, identified                            by appendiceal orifice and  ileocecal valve. The                            colonoscopy was performed without difficulty. The                            patient tolerated the procedure well. The quality                            of the bowel preparation was excellent. Scope In: 7:57:29 AM Scope Out: 8:24:57 AM Scope Withdrawal Time: 0 hours 15 minutes 3 seconds  Total Procedure Duration: 0 hours 27 minutes 28 seconds  Findings:      The perianal and digital rectal examinations were normal.      Two sessile polyps were found in the transverse colon. The polyps were 3       to 5 mm in size. These polyps were removed with a cold snare. Resection       and retrieval were complete.      Scattered small-mouthed diverticula were found in the sigmoid colon.      The retroflexed view of the distal rectum and anal verge was normal and       showed no anal or rectal abnormalities. Impression:               - Two 3 to 5 mm polyps in the transverse colon,                            removed with a cold snare. Resected and retrieved.                           - Diverticulosis in the sigmoid colon.                           - The distal rectum and anal verge are normal on                            retroflexion view. Moderate Sedation:      Per Anesthesia Care Recommendation:           - Discharge patient to home (ambulatory).                           - Resume previous diet.                           -  Await pathology results.                           - Repeat colonoscopy for surveillance based on                            pathology results.                           - Restart Plavix today. Procedure Code(s):        --- Professional ---                           [phone], Colonoscopy, flexible; with removal of                            tumor(s), polyp(s), or other lesion(s) by snare                            technique Diagnosis Code(s):        --- Professional ---                           Z12.11, Encounter for screening  for malignant                            neoplasm of colon                           D12.3, Benign neoplasm of transverse colon (hepatic                            flexure or splenic flexure)                           K57.30, Diverticulosis of large intestine without                            perforation or abscess without bleeding CPT copyright 2022 American Medical Association. All rights reserved. The codes documented in this report are preliminary and upon coder review may  be revised to meet current compliance requirements. Maylon Peppers, MD Maylon Peppers,  03/23/2022 8:29:20 AM This report has been signed electronically. Number of Addenda: 0

## 2022-03-23 NOTE — Op Note (Addendum)
[facility] Patient Name: Seth Graves Procedure Date: 03/23/2022 7:17 AM MRN: [id number] Date of Birth: [birth year] Attending MD: Maylon Peppers , , [id number] CSN: [id number] Age: 61 Admit Type: Outpatient Procedure:                Upper GI endoscopy Indications:              Dysphagia, Exclusion of gastro-esophageal reflux                            disease Providers:                Maylon Peppers, Lambert Mody, Randa Spike, Technician Referring MD:             Halford Chessman MD, MD Medicines:                Monitored Anesthesia Care Complications:            No immediate complications. Estimated Blood Loss:     Estimated blood loss: none. Procedure:                Pre-Anesthesia Assessment:                           - Prior to the procedure, a History and Physical                            was performed, and patient medications, allergies                            and sensitivities were reviewed. The patient's                            tolerance of previous anesthesia was reviewed.                           - The risks and benefits of the procedure and the                            sedation options and risks were discussed with the                            patient. All questions were answered and informed                            consent was obtained.                           - ASA Grade Assessment: II - A patient with mild                            systemic disease.                           After obtaining informed consent, the endoscope was  passed under direct vision. Throughout the                            procedure, the patient's blood pressure, pulse, and                            oxygen saturations were monitored continuously. The                            [id number]-[id number] ([id number]) scope was introduced through the                            mouth, and advanced to the second part of duodenum.                             The upper GI endoscopy was accomplished without                            difficulty. The patient tolerated the procedure                            well. Scope In: 7:47:37 AM Scope Out: 7:52:18 AM Total Procedure Duration: 0 hours 4 minutes 41 seconds  Findings:      The Z-line was irregular and was found 41 cm from the incisors.      A 1 cm hiatal hernia was present.      The gastroesophageal flap valve was visualized endoscopically and       classified as Hill Grade II (fold present, opens with respiration).      The stomach was normal.      The examined duodenum was normal. Impression:               - Z-line irregular, 41 cm from the incisors.                           - 1 cm hiatal hernia.                           - Normal stomach.                           - Normal examined duodenum.                           - No specimens collected. Moderate Sedation:      Per Anesthesia Care Recommendation:           - Discharge patient to home (ambulatory).                           - Resume previous diet.                           - Continue present medications. Procedure Code(s):        --- Professional ---  76811, Esophagogastroduodenoscopy, flexible,                            transoral; diagnostic, including collection of                            specimen(s) by brushing or washing, when performed                            (separate procedure) Diagnosis Code(s):        --- Professional ---                           K22.89, Other specified disease of esophagus                           K44.9, Diaphragmatic hernia without obstruction or                            gangrene                           R13.10, Dysphagia, unspecified CPT copyright 2022 American Medical Association. All rights reserved. The codes documented in this report are preliminary and upon coder review may  be revised to meet current compliance requirements. Maylon Peppers, MD Maylon Peppers,  03/23/2022 7:55:55 AM This report has been signed electronically. Number of Addenda: 0

## 2022-03-23 NOTE — Anesthesia Postprocedure Evaluation (Signed)
Anesthesia Post Note  Patient: Seth Graves  Procedure(s) Performed: COLONOSCOPY WITH PROPOFOL ESOPHAGOGASTRODUODENOSCOPY (EGD) WITH PROPOFOL POLYPECTOMY  Patient location during evaluation: Phase II Anesthesia Type: General Level of consciousness: awake and alert and oriented Pain management: pain level controlled Vital Signs Assessment: post-procedure vital signs reviewed and stable Respiratory status: spontaneous breathing, nonlabored ventilation and respiratory function stable Cardiovascular status: blood pressure returned to baseline and stable Postop Assessment: no apparent nausea or vomiting Anesthetic complications: no  No notable events documented.   Last Vitals:  Vitals:   03/23/22 0829 03/23/22 0832  BP: (!) 85/50 (!) 98/55  Pulse: (!) 58 69  Resp: 16 17  Temp: 36.5 C   SpO2: 94% 96%    Last Pain:  Vitals:   03/23/22 0832  TempSrc:   PainSc: 0-No pain                 Any Mcneice C [name]

## 2022-03-23 NOTE — Discharge Instructions (Addendum)
You are being discharged to home.  Resume your previous diet.  Continue your present medications.  Your physician has recommended a repeat colonoscopy for surveillance based on pathology results.  Restart Plavix today.

## 2022-03-26 ENCOUNTER — Encounter (INDEPENDENT_AMBULATORY_CARE_PROVIDER_SITE_OTHER): Payer: Self-pay | Admitting: *Deleted

## 2022-03-26 LAB — SURGICAL PATHOLOGY

## 2022-03-30 ENCOUNTER — Encounter (HOSPITAL_COMMUNITY): Payer: Self-pay | Admitting: Gastroenterology

## 2022-04-06 ENCOUNTER — Ambulatory Visit: Payer: Managed Care, Other (non HMO)

## 2022-04-13 ENCOUNTER — Other Ambulatory Visit: Payer: Self-pay | Admitting: Cardiology

## 2022-05-15 ENCOUNTER — Other Ambulatory Visit: Payer: Self-pay | Admitting: Cardiology

## 2022-05-15 ENCOUNTER — Ambulatory Visit: Payer: Managed Care, Other (non HMO) | Attending: Internal Medicine | Admitting: Pharmacist

## 2022-05-15 DIAGNOSIS — R739 Hyperglycemia, unspecified: Secondary | ICD-10-CM

## 2022-05-15 DIAGNOSIS — E7849 Other hyperlipidemia: Secondary | ICD-10-CM | POA: Diagnosis not present

## 2022-05-15 NOTE — Assessment & Plan Note (Signed)
Assessment: Triglycerides are extremely elevated concerning for worsening glucose control See lifestyle discussion above  Plan Will check A1c today and send results to patient's PCP Increase physical activity-we discussed how important this can be in blood sugar control See diet discussion above

## 2022-05-15 NOTE — Progress Notes (Signed)
Patient ID: Seth Graves                 DOB: 1960/11/10                    MRN: [id number]      HPI: Seth Graves is a 62 y.o. male patient referred to lipid clinic by Ermalinda Barrios. PMH is significant for CAD s/p DES to the RCA, ramus intermedius and LCx in 2014, low risk NST 2020, HTN, HLD, arthritis, suspected CKD stage stage III.  He was seen for cardiac clearance in November 2023.  At that time his cholesterol was checked, triglycerides were found to be significantly elevated and LDL-C (direct) was 92.  He is currently on rosuvastatin 10 mg daily.  A1c on 11/24/2020 was 6.8.   Patient presents today to lipid clinic.  He states that he is surprised but not surprised by his elevated triglycerides.  Admits to poor diet.  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  His mom is also living with him now and he was eating a lot of sweets that she was baking.  He has since stopped going to [place] and is trying to bring his lunch and eat a lot more vegetables.  He states that his weaknesses are pastries and soft drinks.  Drinks 2 to 3, 20 ounce soft drinks per day.  Also drinks sweet tea.  States he eats out of boredom while driving, but has been trying to listen to podcast and bring healthier food to snack on.  Diabetes runs in his family and he is not all that surprised that his A1c in 2022 was in the diabetic range, however he did not know this.  He does not do any exercise.  He does have a gym membership but has only used it a few times.  He admits to being lazy and that he feels tired all the time.  Reviewed options for lowering LDL cholesterol, including ezetimibe, PCSK-9 inhibitors, bempedoic acid and inclisiran.  Discussed mechanisms of action, dosing, side effects and potential decreases in LDL cholesterol.  Also reviewed cost information and potential options for patient assistance.   Current Medications: Rosuvastatin 10 mg every other day Intolerances:  Atorvastatin 80 mg, simvastatin 20 mg Risk Factors: Premature CAD, hypertension, diabetes LDL-C goal: <55 ApoB goal: <70  Diet: 2-3 20 ounce soft drinks per day, has started eating broccoli Brussels sprouts and a lot more vegetables.  Will sometimes stop at [place].  Has been trying to bring his lunch of breaded frozen chicken tenders and vegetables.  Weakness is pastries.  Exercise: none  Family History:  Family History  Problem Relation Age of Onset   CAD Father        MI in his 36s   CVA Maternal Grandmother    Aneurysm Maternal Grandfather    Heart attack Paternal Grandmother    Alzheimer's disease Paternal Grandmother    Heart attack Paternal Grandfather     Social History: rare etoh,   Labs: Lipid Panel     Component Value Date/Time   CHOL 211 (H) 02/21/2022 1120   TRIG 573 (H) 02/21/2022 1120   HDL 36 (L) 02/21/2022 1120   CHOLHDL 5.9 02/21/2022 1120   VLDL UNABLE TO CALCULATE IF TRIGLYCERIDE OVER 400 mg/dL 02/21/2022 1120   LDLCALC UNABLE TO CALCULATE IF TRIGLYCERIDE OVER 400 mg/dL 02/21/2022 1120   LDLDIRECT 92 02/21/2022 1120    Past  Medical History:  Diagnosis Date   Arthritis    CKD (chronic kidney disease)    Coronary artery disease    NSTEMI 05/2012 s/p PTCA/DES of the mid RCA, ramus intermediate, and mid left circumflex, LVEF 55-60%   Essential hypertension, benign    Gout 06/11/2012   Hypercholesteremia    Hyperglycemia    NSTEMI (non-ST elevated myocardial infarction) [facility])    February 2014    Current Outpatient Medications on File Prior to Visit  Medication Sig Dispense Refill   acetaminophen (TYLENOL) 500 MG tablet Take 500-1,000 mg by mouth every 6 (six) hours as needed (pain.).     allopurinol (ZYLOPRIM) 100 MG tablet Take 100 mg by mouth at bedtime. 100 mg + 300 mg=400 mg at night     allopurinol (ZYLOPRIM) 300 MG tablet Take 300 mg by mouth at bedtime. 300 mg + 100 mg=400 mg at night     amLODipine (NORVASC) 10 MG tablet Take 1 tablet (10 mg  total) by mouth daily. 90 tablet 3   Ascorbic Acid (VITAMIN C) 500 MG CHEW Chew 500 mg by mouth at bedtime.     aspirin EC 81 MG EC tablet Take 1 tablet (81 mg total) by mouth daily.     cholecalciferol (VITAMIN D3) 25 MCG (1000 UNIT) tablet Take 1,000 Units by mouth daily.     clopidogrel (PLAVIX) 75 MG tablet Take 1 tablet by mouth once daily 90 tablet 1   escitalopram (LEXAPRO) 10 MG tablet Take 10 mg by mouth in the morning.     KRILL OIL PO Take 1 capsule by mouth at bedtime.     metoprolol succinate (TOPROL-XL) 50 MG 24 hr tablet Take 1 tablet (50 mg total) by mouth daily. Take with or immediately following a meal. 90 tablet 3   Multiple Vitamin (MULTIVITAMIN) tablet Take 1 tablet by mouth at bedtime.     nitroGLYCERIN (NITROSTAT) 0.4 MG SL tablet Place 1 tablet (0.4 mg total) under the tongue every 5 (five) minutes as needed for chest pain (up to 3 doses). 25 tablet 4   olmesartan (BENICAR) 40 MG tablet Take 40 mg by mouth daily.  3   omeprazole (PRILOSEC) 40 MG capsule Take 1 capsule (40 mg total) by mouth daily. 90 capsule 3   rosuvastatin (CRESTOR) 10 MG tablet TAKE 1 TABLET BY MOUTH EVERY OTHER DAY 45 tablet 3   No current facility-administered medications on file prior to visit.    Allergies  Allergen Reactions   Lipitor [Atorvastatin] Other (See Comments)    myalgias   Demerol [Meperidine Hcl] Nausea And Vomiting    Assessment/Plan:  1. Hyperlipidemia -  Hyperlipidemia Assessment: LDL-C is above goal of less than 55 Patient is taking rosuvastatin 10 mg every other day.  He does not remember why he decreased the frequency Triglycerides are extremely elevated and I am concerned that patient's diabetes got worse.  Patient was not aware his A1c in 2022 was in diabetic range Patient states he knows he needs to exercise but just cannot seem to bring himself to go Admits to prior to labs his diet being extremely poor high in saturated fat fast food and sweets He has already  started to try to improve some of these things and appears motivated to improve his blood sugar control and his cholesterol through his diet and exercise He plans to look into Repatha We had a long discussion about lifestyle and its effect on her cardiovascular risk  Plan Patient will consider Repatha,  he wants to speak with his insurance about his deductible. Patient would like 3 months to improve his diet and physical activity before starting any medication He will get his labs rechecked in [place] in 3 months Continue rosuvastatin 10 mg every other day Encourage patient to set small goals that are achievable Reminded patient that he does not have to eliminate all foods that he enjoys but the portion size and frequency should be decreased However I did strongly encourage patient to cut out sweet tea and soda, start with decreasing quantity if he feels as though he could not stop " cold [place]"  Hyperglycemia Assessment: Triglycerides are extremely elevated concerning for worsening glucose control See lifestyle discussion above  Plan Will check A1c today and send results to patient's PCP Increase physical activity-we discussed how important this can be in blood sugar control See diet discussion above    Thank you,  Ramond Dial, Pharm.D, BCPS, CPP [facility] A Division of [facility] [address] [place], [place] [address]  Phone: [phone]; Fax: [phone]

## 2022-05-15 NOTE — Patient Instructions (Addendum)
We would like to start you on Repatha  Please continue rosuvastatin '10mg'$  every other day Cut back on soft drinks, sweet tea Add in exercise Focus on one goal at a time. Set small achievable goals  Adopting a Healthy Lifestyle.   Weight: Know what a healthy weight is for you (roughly BMI <25) and aim to maintain this. You can calculate your body mass index on your smart phone  Diet: Aim for 7+ servings of fruits and vegetables daily Limit animal fats in diet for cholesterol and heart health - choose grass fed whenever available Avoid highly processed foods (fast food burgers, tacos, fried chicken, pizza, hot dogs, french fries)  Saturated fat comes in the form of butter, lard, coconut oil, margarine, partially hydrogenated oils, and fat in meat. These increase your risk of cardiovascular disease.  Use healthy plant oils, such as olive, canola, soy, corn, sunflower and peanut.  Whole foods such as fruits, vegetables and whole grains have fiber  Men need > 38 grams of fiber per day Women need > 25 grams of fiber per day  Load up on vegetables and fruits - one-half of your plate: Aim for color and variety, and remember that potatoes dont count. Go for whole grains - one-quarter of your plate: Whole wheat, barley, wheat berries, quinoa, oats, brown rice, and foods made with them. If you want pasta, go with whole wheat pasta. Protein power - one-quarter of your plate: Fish, chicken, beans, and nuts are all healthy, versatile protein sources. Limit red meat. You need carbohydrates for energy! The type of carbohydrate is more important than the amount. Choose carbohydrates such as vegetables, fruits, whole grains, beans, and nuts in the place of white rice, white pasta, potatoes (baked or fried), macaroni and cheese, cakes, cookies, and donuts.  If youre thirsty, drink water. Coffee and tea are good in moderation, but skip sugary drinks and limit milk and dairy products to one or two daily  servings. Keep sugar intake at 6 teaspoons or 24 grams or LESS       Exercise: Aim for 150 min of moderate intensity exercise weekly for heart health, and weights twice weekly for bone health Stay active - any steps are better than no steps! Aim for 7-9 hours of sleep daily

## 2022-05-15 NOTE — Assessment & Plan Note (Signed)
Assessment: LDL-C is above goal of less than 55 Patient is taking rosuvastatin 10 mg every other day.  He does not remember why he decreased the frequency Triglycerides are extremely elevated and I am concerned that patient's diabetes got worse.  Patient was not aware his A1c in 2022 was in diabetic range Patient states he knows he needs to exercise but just cannot seem to bring himself to go Admits to prior to labs his diet being extremely poor high in saturated fat fast food and sweets He has already started to try to improve some of these things and appears motivated to improve his blood sugar control and his cholesterol through his diet and exercise He plans to look into Repatha We had a long discussion about lifestyle and its effect on her cardiovascular risk  Plan Patient will consider Repatha, he wants to speak with his insurance about his deductible. Patient would like 3 months to improve his diet and physical activity before starting any medication He will get his labs rechecked in [place] in 3 months Continue rosuvastatin 10 mg every other day Encourage patient to set small goals that are achievable Reminded patient that he does not have to eliminate all foods that he enjoys but the portion size and frequency should be decreased However I did strongly encourage patient to cut out sweet tea and soda, start with decreasing quantity if he feels as though he could not stop " cold [place]"

## 2022-05-16 ENCOUNTER — Telehealth: Payer: Self-pay

## 2022-05-16 LAB — HEMOGLOBIN A1C
Est. average glucose Bld gHb Est-mCnc: 154 mg/dL
Hgb A1c MFr Bld: 7 % — ABNORMAL HIGH (ref 4.8–5.6)

## 2022-05-16 NOTE — Telephone Encounter (Signed)
Patient notified and verbalized understanding. Patient stated he will call office today and make appt with PCP. Pt had no questions or concerns at this time. PCP copied.

## 2022-05-16 NOTE — Telephone Encounter (Signed)
-----  Message from Liliane Shi, [place] sent at 05/16/2022  8:06 AM EST ----- Covering for Ermalinda Barrios, PA-C while she is out. A1c ordered by PharmD at [facility] appt yesterday.  A1c elevated; in diabetic range.  PLAN:  -Pt will need to follow up with his PCP for diabetes in the next 2 weeks -Please arrange appt -Please send copy of labs to PCP Richardson Dopp, PA-C    05/16/2022 8:04 AM

## 2022-06-10 ENCOUNTER — Other Ambulatory Visit: Payer: Self-pay | Admitting: Physician Assistant

## 2022-07-23 ENCOUNTER — Encounter (INDEPENDENT_AMBULATORY_CARE_PROVIDER_SITE_OTHER): Payer: Self-pay | Admitting: Gastroenterology

## 2022-07-23 ENCOUNTER — Ambulatory Visit (INDEPENDENT_AMBULATORY_CARE_PROVIDER_SITE_OTHER): Payer: Managed Care, Other (non HMO) | Admitting: Gastroenterology

## 2022-07-25 ENCOUNTER — Other Ambulatory Visit: Payer: Self-pay | Admitting: Physician Assistant

## 2022-07-31 ENCOUNTER — Encounter (INDEPENDENT_AMBULATORY_CARE_PROVIDER_SITE_OTHER): Payer: Self-pay | Admitting: Gastroenterology

## 2022-09-24 ENCOUNTER — Other Ambulatory Visit: Payer: Self-pay

## 2022-09-24 MED ORDER — NITROGLYCERIN 0.4 MG SL SUBL: 0.4000 mg | SUBLINGUAL_TABLET | SUBLINGUAL | 4 refills | Status: AC | PRN

## 2022-09-24 NOTE — Telephone Encounter (Signed)
Refilled NTG 0.4 mg SL to [facility]  Patient has not had to use any but rx had expired

## 2022-11-13 ENCOUNTER — Ambulatory Visit (INDEPENDENT_AMBULATORY_CARE_PROVIDER_SITE_OTHER): Payer: Managed Care, Other (non HMO) | Admitting: Gastroenterology

## 2022-11-20 ENCOUNTER — Ambulatory Visit (INDEPENDENT_AMBULATORY_CARE_PROVIDER_SITE_OTHER): Payer: Managed Care, Other (non HMO) | Admitting: Gastroenterology

## 2022-11-20 ENCOUNTER — Encounter (INDEPENDENT_AMBULATORY_CARE_PROVIDER_SITE_OTHER): Payer: Self-pay | Admitting: Gastroenterology

## 2022-11-20 VITALS — BP 133/76 | HR 60 | Temp 98.5°F | Ht 72.0 in | Wt 256.0 lb

## 2022-11-20 DIAGNOSIS — R197 Diarrhea, unspecified: Secondary | ICD-10-CM | POA: Insufficient documentation

## 2022-11-20 DIAGNOSIS — K219 Gastro-esophageal reflux disease without esophagitis: Secondary | ICD-10-CM

## 2022-11-20 NOTE — Patient Instructions (Signed)
Please continue with omeprazole 40mg  daily Be mindful of greasy, spicy, fried, citrus foods, and be mindful that caffeine, carbonated drinks, chocolate and alcohol can increase reflux symptoms Stay upright 2-3 hours after eating, prior to lying down and avoid eating late in the evenings.  As discussed higher fiber foods such as salads can sometimes cause looser stools, however, If diarrhea persists or worsens, please let me know.   Follow up 1 year   It was a pleasure to see you today. I want to create trusting relationships with patients and provide genuine, compassionate, and quality care. I truly value your feedback! please be on the lookout for a survey regarding your visit with me today. I appreciate your input about our visit and your time in completing this!     L. Jeanmarie Hubert, MSN, APRN, AGNP-C Adult-Gerontology Nurse Practitioner [facility] at [facility]

## 2022-11-20 NOTE — Progress Notes (Addendum)
Referring Provider: Assunta Found, MD Primary Care Physician:  Assunta Found, MD Primary GI Physician:   Chief Complaint  Patient presents with   Gastroesophageal Reflux    Follow up on GERD. States he is not having any issues with acid reflux or dysphagia.    Diarrhea    Having some tightness in abdomen, gas and one episode of diarrhea. Thinks it is from the salad he ate last night that he put a little too much olive oil on and has been eating more onions lately. Had an episode like this about one week ago also.    HPI:   Seth Graves is a 62 y.o. male with past medical history of CAD and NSTEMI s/p stents on DAPT, HTN, gout, CKD   Patient presenting today for follow up of GERD/dysphagia and with some diarrhea  Last seen October 2023, at that time having heartburn, taking tums. Having some dysphagia a few times per week.   Recommended to schedule EGD/Colonoscopy, start omeprazole 40mg  daily  Present:  Patient states that GERD and dysphagia have resolved since starting omeprazole 40mg  daily and undergoing EGD. He denies any breakthrough symptoms. No nausea or vomiting. He is feeling good in regards to his GERD symptoms.   He notes some abdominal discomfort/gas feeling that began last night. Typically has a BM twice per day. Notes 2 episodes of diarrhea in the past month. He typically does not eat salads and had a large one yesterday with oil and vinegar and had diarrhea last night. He notes previous diarrhea episode started after he resumed his vitamins. He does also report eating more onions recently. Reports he does not eat much fiber in his diet typically.    Last Colonoscopy:03/2022  - Two 3 to 5 mm polyps in the transverse colon-hyperplastic polyps                            - Diverticulosis in the sigmoid colon.                           - The distal rectum and anal verge are normal on                            retroflexion view. Last Endoscopy: 03/2022 - Z-line irregular, 41  cm from the incisors.                           - 1 cm hiatal hernia.                           - Normal stomach.                           - Normal examined duodenum.                           - No specimens collected.  Recommendations:  Repeat TCS 10 years   Past Medical History:  Diagnosis Date   Arthritis    CKD (chronic kidney disease)    Coronary artery disease    NSTEMI 05/2012 s/p PTCA/DES of the mid RCA, ramus intermediate, and mid left circumflex, LVEF 55-60%   Essential hypertension, benign    Gout 06/11/2012  Hypercholesteremia    Hyperglycemia    NSTEMI (non-ST elevated myocardial infarction) [facility])    February 2014    Past Surgical History:  Procedure Laterality Date   BACK SURGERY     COLONOSCOPY WITH PROPOFOL N/A 03/23/2022   Procedure: COLONOSCOPY WITH PROPOFOL;  Surgeon: Dolores Frame, MD;  [facility];  Service: Gastroenterology;  Laterality: N/A;  730 ASA 2   ESOPHAGOGASTRODUODENOSCOPY (EGD) WITH PROPOFOL N/A 03/23/2022   Procedure: ESOPHAGOGASTRODUODENOSCOPY (EGD) WITH PROPOFOL;  Surgeon: Dolores Frame, MD;  [facility];  Service: Gastroenterology;  Laterality: N/A;   HERNIA REPAIR     LEFT HEART CATHETERIZATION WITH CORONARY ANGIOGRAM N/A 06/04/2012   Procedure: LEFT HEART CATHETERIZATION WITH CORONARY ANGIOGRAM;  Surgeon: Peter M Swaziland, MD;  Location: [facility] CATH LAB;  Service: Cardiovascular;  Laterality: N/A;   NECK SURGERY     PERCUTANEOUS CORONARY STENT INTERVENTION (PCI-S)  06/04/2012   Procedure: PERCUTANEOUS CORONARY STENT INTERVENTION (PCI-S);  Surgeon: Peter M Swaziland, MD;  Location: [facility] CATH LAB;  Service: Cardiovascular;;   POLYPECTOMY  03/23/2022   Procedure: POLYPECTOMY;  Surgeon: Marguerita Merles, Reuel Boom, MD;  Location: [facility];  Service: Gastroenterology;;    Current Outpatient Medications  Medication Sig Dispense Refill   acetaminophen (TYLENOL) 500 MG tablet Take 500-1,000 mg by mouth every 6  (six) hours as needed (pain.).     allopurinol (ZYLOPRIM) 100 MG tablet Take 100 mg by mouth at bedtime. 100 mg + 300 mg=400 mg at night     allopurinol (ZYLOPRIM) 300 MG tablet Take 300 mg by mouth at bedtime. 300 mg + 100 mg=400 mg at night     amLODipine (NORVASC) 10 MG tablet Take 1 tablet (10 mg total) by mouth daily. 90 tablet 3   Ascorbic Acid (VITAMIN C) 500 MG CHEW Chew 500 mg by mouth at bedtime.     aspirin EC 81 MG EC tablet Take 1 tablet (81 mg total) by mouth daily.     cholecalciferol (VITAMIN D3) 25 MCG (1000 UNIT) tablet Take 1,000 Units by mouth daily.     clopidogrel (PLAVIX) 75 MG tablet Take 1 tablet by mouth once daily 90 tablet 2   Cyanocobalamin (B-12 PO) Take by mouth daily.     escitalopram (LEXAPRO) 10 MG tablet Take 10 mg by mouth in the morning.     metoprolol succinate (TOPROL-XL) 50 MG 24 hr tablet TAKE 1 TABLET BY MOUTH ONCE DAILY TAKE WITH OR IMMEDIATELY FOLLOWING A MEAL 90 tablet 2   Multiple Vitamin (MULTIVITAMIN) tablet Take 1 tablet by mouth at bedtime.     nitroGLYCERIN (NITROSTAT) 0.4 MG SL tablet Place 1 tablet (0.4 mg total) under the tongue every 5 (five) minutes as needed for chest pain (up to 3 doses). 25 tablet 4   olmesartan (BENICAR) 40 MG tablet Take 40 mg by mouth daily.  3   omeprazole (PRILOSEC) 40 MG capsule Take 1 capsule (40 mg total) by mouth daily. 90 capsule 3   rosuvastatin (CRESTOR) 10 MG tablet TAKE 1 TABLET BY MOUTH EVERY OTHER DAY 45 tablet 3   No current facility-administered medications for this visit.    Allergies as of 11/20/2022 - Review Complete 11/20/2022  Allergen Reaction Noted   Lipitor [atorvastatin] Other (See Comments) 07/27/2015   Demerol [meperidine hcl] Nausea And Vomiting 12/26/2020    Family History  Problem Relation Age of Onset   CAD Father        MI in his 73s   CVA  Maternal Grandmother    Aneurysm Maternal Grandfather    Heart attack Paternal Grandmother    Alzheimer's disease Paternal Grandmother     Heart attack Paternal Grandfather     Social History   Socioeconomic History   Marital status: Married    Spouse name: Not on file   Number of children: Not on file   Years of education: Not on file   Highest education level: Not on file  Occupational History   Not on file  Tobacco Use   Smoking status: Former    Current packs/day: 0.00    Types: Cigarettes    Start date: 04/16/1980    Quit date: 04/16/2001    Years since quitting: 21.6   Smokeless tobacco: Never  Vaping Use   Vaping status: Never Used  Substance and Sexual Activity   Alcohol use: No    Alcohol/week: 0.0 standard drinks of alcohol   Drug use: No   Sexual activity: Yes    Birth control/protection: None  Other Topics Concern   Not on file  Social History Narrative   Not on file   Social Determinants of Health   Financial Resource Strain: Not on file  Food Insecurity: Not on file  Transportation Needs: Not on file  Physical Activity: Not on file  Stress: Not on file  Social Connections: Not on file   Review of systems General: negative for malaise, night sweats, fever, chills, weight loss Neck: Negative for lumps, goiter, pain and significant neck swelling Resp: Negative for cough, wheezing, dyspnea at rest CV: Negative for chest pain, leg swelling, palpitations, orthopnea GI: denies melena, hematochezia, nausea, vomiting, constipation, dysphagia, odyonophagia, early satiety or unintentional weight loss. +diarrhea  MSK: Negative for joint pain or swelling, back pain, and muscle pain. Derm: Negative for itching or rash Psych: Denies depression, anxiety, memory loss, confusion. No homicidal or suicidal ideation.  Heme: Negative for prolonged bleeding, bruising easily, and swollen nodes. Endocrine: Negative for cold or heat intolerance, polyuria, polydipsia and goiter. Neuro: negative for tremor, gait imbalance, syncope and seizures. The remainder of the review of systems is noncontributory.  Physical  Exam: BP 133/76 (BP Location: Left Arm, Patient Position: Sitting, Cuff Size: Large)   Pulse 60   Temp 98.5 F (36.9 C) (Oral)   Ht 6' (1.829 m)   Wt 256 lb (116.1 kg)   BMI 34.72 kg/m  General:   Alert and oriented. No distress noted. Pleasant and cooperative.  Head:  Normocephalic and atraumatic. Eyes:  Conjuctiva clear without scleral icterus. Mouth:  Oral mucosa pink and moist. Good dentition. No lesions. Heart: Normal rate and rhythm, s1 and s2 heart sounds present.  Lungs: Clear lung sounds in all lobes. Respirations equal and unlabored. Abdomen:  +BS, soft, non-tender and non-distended. No rebound or guarding. No HSM or masses noted. Derm: No palmar erythema or jaundice Msk:  Symmetrical without gross deformities. Normal posture. Extremities:  Without edema. Neurologic:  Alert and  oriented x4 Psych:  Alert and cooperative. Normal mood and affect.  Invalid input(s): "6 MONTHS"   ASSESSMENT: Seth Graves is a 62 y.o. male presenting today for follow up of GERD, also with some diarrhea  GERD/dysphagia: GERD well controlled on Omeprazole 40mg  daily, no breakthrough. Dysphagia has resolved. We will continue with current PPI regimen. Patient did inquire about safety of remaining on PPI therapy. Patient was re-assured regarding PPI use and safety of when appropriate indications in question. Most recent studies on PPI therapy that association of symptoms  is not equivalent to causation and overall association with for example osteoporosis is weak and based on observational studies. When PPI use is indicated, it is safe to proceed with therapy and titrate dosing/use based on symptom response.  Diarrhea: 2 episodes in the past month, 1st after resuming vitamins, second episode last night after consuming a large salad yesterday. Suspect diarrhea is secondary to his food intake given diet typically is low in fiber and episode occurred after consuming lettuce. He has no red flag symptoms.  Recent TCS in December 2023.  Discussed that high fiber foods can sometimes cause diarrhea, however, if this persists, or he has any new associated symptoms with the diarrhea, he should make me aware.    PLAN:  Continue with omeprazole 40mg  daily  2. Pt to be mindful of high fiber foods 3. Pt to make me aware if diarrhea persists or he has new/associated symptoms with it   All questions were answered, patient verbalized understanding and is in agreement with plan as outlined above.    Follow Up: 1 year    L. Jeanmarie Hubert, MSN, APRN, AGNP-C Adult-Gerontology Nurse Practitioner [facility]  I have reviewed the note and agree with the APP's assessment as described in this progress note  Katrinka Blazing, MD Gastroenterology and Hepatology [facility] Gastroenterology

## 2023-04-24 ENCOUNTER — Other Ambulatory Visit (INDEPENDENT_AMBULATORY_CARE_PROVIDER_SITE_OTHER): Payer: Self-pay | Admitting: Gastroenterology

## 2023-04-24 DIAGNOSIS — K219 Gastro-esophageal reflux disease without esophagitis: Secondary | ICD-10-CM

## 2023-04-29 ENCOUNTER — Other Ambulatory Visit: Payer: Self-pay | Admitting: Cardiology

## 2023-05-17 ENCOUNTER — Other Ambulatory Visit: Payer: Self-pay | Admitting: Cardiology

## 2023-06-05 ENCOUNTER — Other Ambulatory Visit: Payer: Self-pay | Admitting: Cardiology

## 2023-06-10 ENCOUNTER — Other Ambulatory Visit: Payer: Self-pay | Admitting: Physician Assistant

## 2023-06-21 ENCOUNTER — Ambulatory Visit: Payer: Managed Care, Other (non HMO) | Attending: Student | Admitting: Student

## 2023-06-21 ENCOUNTER — Encounter: Payer: Self-pay | Admitting: *Deleted

## 2023-06-21 ENCOUNTER — Encounter: Payer: Self-pay | Admitting: Student

## 2023-06-21 VITALS — BP 142/78 | HR 78 | Ht 72.0 in | Wt 271.6 lb

## 2023-06-21 DIAGNOSIS — I10 Essential (primary) hypertension: Secondary | ICD-10-CM | POA: Diagnosis not present

## 2023-06-21 DIAGNOSIS — I25118 Atherosclerotic heart disease of native coronary artery with other forms of angina pectoris: Secondary | ICD-10-CM | POA: Diagnosis not present

## 2023-06-21 DIAGNOSIS — R0609 Other forms of dyspnea: Secondary | ICD-10-CM

## 2023-06-21 DIAGNOSIS — R072 Precordial pain: Secondary | ICD-10-CM | POA: Diagnosis not present

## 2023-06-21 DIAGNOSIS — K219 Gastro-esophageal reflux disease without esophagitis: Secondary | ICD-10-CM

## 2023-06-21 DIAGNOSIS — E785 Hyperlipidemia, unspecified: Secondary | ICD-10-CM

## 2023-06-21 DIAGNOSIS — Z79899 Other long term (current) drug therapy: Secondary | ICD-10-CM

## 2023-06-21 MED ORDER — METOPROLOL SUCCINATE ER 50 MG PO TB24: 50.0000 mg | ORAL_TABLET | Freq: Every day | ORAL | 3 refills | Status: DC

## 2023-06-21 MED ORDER — CLOPIDOGREL BISULFATE 75 MG PO TABS: 75.0000 mg | ORAL_TABLET | Freq: Every day | ORAL | 3 refills | Status: AC

## 2023-06-21 MED ORDER — PANTOPRAZOLE SODIUM 40 MG PO TBEC: 40.0000 mg | DELAYED_RELEASE_TABLET | Freq: Every day | ORAL | 3 refills | Status: AC

## 2023-06-21 MED ORDER — AMLODIPINE BESYLATE 10 MG PO TABS: 10.0000 mg | ORAL_TABLET | Freq: Every day | ORAL | 3 refills | Status: DC

## 2023-06-21 NOTE — Progress Notes (Signed)
 Cardiology Office Note    Date:  06/21/2023  ID:  Seth Graves, DOB [birth year], MRN [id number] Cardiologist: Nona Dell, MD    History of Present Illness:    Seth Graves is a 63 y.o. male with past medical history of CAD (s/p NSTEMI in 2014 with DES to RCA, RI, and LCx with low-risk NST's in 07/2015 and 02/2019), HTN, HLD, and prior tobacco use who presents to the office today for overdue follow-up.  He was last examined by Jacolyn Reedy, PA in 02/2022 and denied any recent anginal symptoms at that time. He was needing cardiac clearance for endoscopy and colonoscopy and was cleared to proceed with this. No changes were made to his current medications and he was continued on Amlodipine 10 mg daily, ASA 81 mg daily, Crestor 10 mg daily, Toprol-XL 50 mg daily and Olmesartan 40 mg daily.  In talking with the patient today, he does report having occasional episodes of chest discomfort over the past several months. He is unsure if this is due to his heart or gas as symptoms sometimes improve with drinking soda but at other times pain has persisted. Does radiate into his back at times which he reports happened with his prior cardiac event but pain has not been as severe as this and is usually brief. He has not utilized nitroglycerin. He does report dyspnea on exertion and is unsure if this is due to a heart cause or weight gain. No specific orthopnea or PND. 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  Studies Reviewed:   EKG: EKG is ordered today and demonstrates:   EKG Interpretation Date/Time:  Friday June 21 2023 14:52:04 EST Ventricular Rate:  78 PR Interval:  152 QRS Duration:  90 QT Interval:  396 QTC Calculation: 451 R Axis:   26  Text Interpretation: Normal sinus rhythm Normal ECG Confirmed by Randall An ([id number]) on 06/21/2023 3:02:01 PM       NST: 02/2019 No diagnostic ST segment changes to  indicate ischemia. Small, mild intensity, apical inferior and basal inferoseptal defects that are fixed and most consistent with soft tissue attenuation. No clearly reversible defects to indicate ischemia. This is a low risk study. Nuclear stress EF: 51%.  Risk Assessment/Calculations:     HYPERTENSION CONTROL Vitals:   06/21/23 1448 06/21/23 1528  BP: (!) 150/80 (!) 142/78    The patient's blood pressure is elevated above target today.  In order to address the patient's elevated BP: Blood pressure will be monitored at home to determine if medication changes need to be made.      Physical Exam:   VS:  BP (!) 142/78   Pulse 78   Ht 6' (1.829 m)   Wt 271 lb 9.6 oz (123.2 kg)   SpO2 96%   BMI 36.84 kg/m    Wt Readings from Last 3 Encounters:  06/21/23 271 lb 9.6 oz (123.2 kg)  11/20/22 256 lb (116.1 kg)  03/23/22 260 lb (117.9 kg)     GEN: Well nourished, well developed male appearing in no acute distress NECK: No JVD; No carotid bruits CARDIAC: RRR, no murmurs, rubs, gallops RESPIRATORY:  Clear to auscultation without rales, wheezing or rhonchi  ABDOMEN: Appears non-distended. No obvious abdominal masses. EXTREMITIES: No clubbing or cyanosis. No pitting edema.  Distal pedal pulses are 2+ bilaterally.   Assessment and Plan:   1. Chest Pain with Mixed Features/Dyspnea on  Exertion - He reports episodes of chest pain as outlined above and episodes typically occur at rest which seems atypical for angina and symptoms are sometimes relieved with soda but he does report episodes of pain which radiate into his back and resemble his prior angina. Will plan for a follow-up Lexiscan Myoview for repeat ischemic evaluation given his symptoms and the timeframe since last evaluation. Will also check routine labs (CMET, TSH, CBC) to assess for other causes.   2. CAD - He has a history of multiple stents in 2014 as discussed above and most recent ischemic evaluation was in 2020. Will plan  for repeat stress test as discussed above. He has been continued on long-term DAPT with Plavix and ASA given his multiple stents (he has been on Prilosec and would switch to Protonix). Continue current therapy for now along with Toprol-XL 50 mg daily and Crestor 10 mg daily.  3. HTN - BP was elevated during today's visit, at 150/80 on initial check and 142/78 on repeat check. He does take his medications in the evening and is due to take these in a few hours. Was encouraged to follow readings at home.  Continue current medical therapy for now with Amlodipine 10 mg daily, Toprol-XL 50 mg daily and Olmesartan 40 mg daily. Will recheck renal function and electrolytes.  4. HLD - LDL was at 92 when checked in 02/2022 and he was [facility]. No recent values available for comparison. Will recheck an FLP and LFT's with upcoming labs. Continue current medical therapy for now with Crestor 10 mg every other day. If LDL remains above goal, would titrate to daily dosing.  5. GERD - He has been on both Omeprazole and Plavix and should be on a different PPI given the interaction with these. Called to review with the patient and will stop Prilosec and switch to Protonix 40 mg daily.  Disposition: Will plan to follow-up in 1 year unless stress test is abnormal.  Signed, Ellsworth Lennox, PA-C

## 2023-06-21 NOTE — Patient Instructions (Signed)
 Medication Instructions:  Your physician recommends that you continue on your current medications as directed. Please refer to the Current Medication list given to you today.  *If you need a refill on your cardiac medications before your next appointment, please call your pharmacy*   Lab Work: Your physician recommends that you return for lab work. ( Fasting )   If you have labs (blood work) drawn today and your tests are completely normal, you will receive your results only by: MyChart Message (if you have MyChart) OR A paper copy in the mail If you have any lab test that is abnormal or we need to change your treatment, we will call you to review the results.   Testing/Procedures: NONE    Follow-Up: At [facility], you and your health needs are our priority.  As part of our continuing mission to provide you with exceptional heart care, we have created designated Provider Care Teams.  These Care Teams include your primary Cardiologist (physician) and Advanced Practice Providers (APPs -  Physician Assistants and Nurse Practitioners) who all work together to provide you with the care you need, when you need it.  We recommend signing up for the patient portal called "MyChart".  Sign up information is provided on this After Visit Summary.  MyChart is used to connect with patients for Virtual Visits (Telemedicine).  Patients are able to view lab/test results, encounter notes, upcoming appointments, etc.  Non-urgent messages can be sent to your provider as well.   To learn more about what you can do with MyChart, go to [email or web address].    Your next appointment:   1 year(s)  Provider:   You may see Nona Dell, MD or one of the following Advanced Practice Providers on your designated Care Team:   Randall An, PA-C  Jacolyn Reedy, PA-C     Other Instructions Thank you for choosing [facility]!

## 2023-06-26 ENCOUNTER — Telehealth (HOSPITAL_COMMUNITY): Payer: Self-pay | Admitting: Surgery

## 2023-06-26 ENCOUNTER — Other Ambulatory Visit (HOSPITAL_COMMUNITY): Payer: Self-pay

## 2023-06-26 ENCOUNTER — Telehealth: Payer: Self-pay | Admitting: Pharmacy Technician

## 2023-06-26 NOTE — Telephone Encounter (Signed)
 Pharmacy Patient Advocate Encounter   Received notification from CoverMyMeds that prior authorization for pantoprazole is required/requested.   Insurance verification completed.   The patient is insured through [employer] .   Per test claim: PA required; PA submitted to above mentioned insurance via CoverMyMeds Key/confirmation #/EOC [id number] Status is pending

## 2023-06-26 NOTE — Telephone Encounter (Signed)
 I called to remind the pt of his stress test tomorrow, with his arrival time being 7:45.  I left a VM with the following instructions:  nothing to eat or drink 6 hrs prior to the test, no smoking 8 hrs prior to the test, wear comfortable clothes, no lotions/oils, and to follow any instructions from his cardiologist regarding holding any medications.  I told the pt call back if he had any questions.

## 2023-06-27 ENCOUNTER — Ambulatory Visit (HOSPITAL_COMMUNITY)
Admission: RE | Admit: 2023-06-27 | Discharge: 2023-06-27 | Disposition: A | Discharge status: Home / Self Care | Source: Ambulatory Visit | Attending: Cardiology | Admitting: Cardiology

## 2023-06-27 ENCOUNTER — Ambulatory Visit (HOSPITAL_COMMUNITY)
Admission: RE | Admit: 2023-06-27 | Discharge: 2023-06-27 | Disposition: A | Discharge status: Home / Self Care | Source: Ambulatory Visit | Attending: Student | Admitting: Student

## 2023-06-27 ENCOUNTER — Other Ambulatory Visit (HOSPITAL_COMMUNITY)
Admission: RE | Admit: 2023-06-27 | Discharge: 2023-06-27 | Disposition: A | Discharge status: Home / Self Care | Source: Ambulatory Visit | Attending: Student | Admitting: Student

## 2023-06-27 DIAGNOSIS — R0609 Other forms of dyspnea: Secondary | ICD-10-CM | POA: Diagnosis present

## 2023-06-27 DIAGNOSIS — Z79899 Other long term (current) drug therapy: Secondary | ICD-10-CM | POA: Diagnosis present

## 2023-06-27 DIAGNOSIS — R072 Precordial pain: Secondary | ICD-10-CM | POA: Diagnosis not present

## 2023-06-27 DIAGNOSIS — I25118 Atherosclerotic heart disease of native coronary artery with other forms of angina pectoris: Secondary | ICD-10-CM | POA: Diagnosis not present

## 2023-06-27 DIAGNOSIS — I10 Essential (primary) hypertension: Secondary | ICD-10-CM

## 2023-06-27 DIAGNOSIS — E785 Hyperlipidemia, unspecified: Secondary | ICD-10-CM | POA: Insufficient documentation

## 2023-06-27 LAB — LIPID PANEL
Cholesterol: 155 mg/dL (ref 0–200)
HDL: 34 mg/dL — ABNORMAL LOW (ref >40)
LDL Cholesterol: 83 mg/dL (ref 0–99)
Total CHOL/HDL Ratio: 4.6 ratio
Triglycerides: 191 mg/dL — ABNORMAL HIGH (ref <150)
VLDL: 38 mg/dL (ref 0–40)

## 2023-06-27 LAB — COMPREHENSIVE METABOLIC PANEL
ALT: 62 U/L — ABNORMAL HIGH (ref 0–44)
AST: 65 U/L — ABNORMAL HIGH (ref 15–41)
Albumin: 3.6 g/dL (ref 3.5–5.0)
Alkaline Phosphatase: 55 U/L (ref 38–126)
Anion gap: 13 (ref 5–15)
BUN: 11 mg/dL (ref 6–20)
CO2: 23 mmol/L (ref 22–32)
Calcium: 9 mg/dL (ref 8.9–10.3)
Chloride: 102 mmol/L (ref 98–111)
Creatinine, Ser: 1.32 mg/dL — ABNORMAL HIGH (ref 0.61–1.24)
GFR, Estimated: >60 mL/min (ref >60)
Glucose, Bld: 224 mg/dL — ABNORMAL HIGH (ref 70–99)
Potassium: 3.8 mmol/L (ref 3.5–5.1)
Sodium: 138 mmol/L (ref 135–145)
Total Bilirubin: 0.6 mg/dL (ref 0.0–1.2)
Total Protein: 6.8 g/dL (ref 6.5–8.1)

## 2023-06-27 LAB — NM MYOCAR MULTI W/SPECT W/WALL MOTION / EF
Base ST Depression (mm): 0 mm
Estimated workload: 1
Exercise duration (min): 0 min
Exercise duration (sec): 0 s
LV dias vol: 114 mL (ref 62–150)
LV sys vol: 47 mL
MPHR: 160 {beats}/min
Nuc Stress EF: 59 %
Peak HR: 102 {beats}/min
Percent HR: 63 %
RATE: 0.3
Rest HR: 63 {beats}/min
Rest Nuclear Isotope Dose: 10.9 mCi
SDS: 1
SRS: 0
SSS: 1
ST Depression (mm): 0 mm
Stress Nuclear Isotope Dose: 31.5 mCi
TID: 0.95

## 2023-06-27 LAB — CBC
HCT: 42.4 % (ref 39.0–52.0)
Hemoglobin: 14.3 g/dL (ref 13.0–17.0)
MCH: 28.5 pg (ref 26.0–34.0)
MCHC: 33.7 g/dL (ref 30.0–36.0)
MCV: 84.6 fL (ref 80.0–100.0)
Platelets: 237 10*3/uL (ref 150–400)
RBC: 5.01 MIL/uL (ref 4.22–5.81)
RDW: 13.1 % (ref 11.5–15.5)
WBC: 6.9 10*3/uL (ref 4.0–10.5)
nRBC: 0 % (ref 0.0–0.2)

## 2023-06-27 LAB — TSH: TSH: 1.42 u[IU]/mL (ref 0.350–4.500)

## 2023-06-27 LAB — BRAIN NATRIURETIC PEPTIDE: B Natriuretic Peptide: 13 pg/mL (ref 0.0–100.0)

## 2023-06-27 MED ORDER — SODIUM CHLORIDE FLUSH 0.9 % IV SOLN
INTRAVENOUS | Status: AC
  Administered 2023-06-27: 10 mL via INTRAVENOUS
  Filled 2023-06-27: qty 10

## 2023-06-27 MED ORDER — TECHNETIUM TC 99M TETROFOSMIN IV KIT
10.9000 | PACK | Freq: Once | INTRAVENOUS | Status: AC | PRN
  Administered 2023-06-27: 10.9 via INTRAVENOUS

## 2023-06-27 MED ORDER — TECHNETIUM TC 99M TETROFOSMIN IV KIT
31.5000 | PACK | Freq: Once | INTRAVENOUS | Status: AC | PRN
  Administered 2023-06-27: 31.5 via INTRAVENOUS

## 2023-06-27 MED ORDER — REGADENOSON 0.4 MG/5ML IV SOLN
INTRAVENOUS | Status: AC
Start: 2023-06-27 — End: 2023-06-27
  Administered 2023-06-27: 0.4 mg via INTRAVENOUS
  Filled 2023-06-27: qty 5

## 2023-06-28 ENCOUNTER — Telehealth: Payer: Self-pay | Admitting: *Deleted

## 2023-06-28 DIAGNOSIS — E7849 Other hyperlipidemia: Secondary | ICD-10-CM

## 2023-06-28 NOTE — Telephone Encounter (Signed)
-----   Message from Ellsworth Lennox sent at 06/27/2023 11:46 AM EDT ----- Please let the patient know that his hemoglobin and platelets are within a normal range. His thyroid function and fluid level are within a normal range as well. Electrolytes stable and he does have slightly abnormal kidney function with creatinine at 1.32 but overall similar to a year prior. His liver function enzymes are mildly elevated with AST at 65 and ALT at 62. He is not listed as consuming alcohol but please confirm with the patient. If he is, would reduce use. Cholesterol has significantly improved from 2023 as total cholesterol was previously at 211 and is now down to 155. Triglycerides were at 573 in 2023 and now down to 191. LDL is at 83 which is slightly above goal but would be hesitant to further titrate Crestor for now given his slightly elevated liver enzymes. Would recommend rechecking liver enzymes (hepatic function panel) in 1 month for reassessment. If still abnormal, may need to refer back to GI.

## 2023-06-28 NOTE — Telephone Encounter (Signed)
 The patient has been notified of the result and verbalized understanding.  All questions (if any) were answered. Seth Massed, LPN 9/60/4540 9:81 AM   Pt agrees with plan of care and will have lab done in one month.

## 2023-07-03 NOTE — Telephone Encounter (Signed)
 Pharmacy Patient Advocate Encounter  Received notification from [employer] that Prior Authorization for pantoprazole has been DENIED.  See denial reason below. No denial letter attached in CMM. Will attach denial letter to Media tab once received.   PA #/Case ID/Reference #: [id number]

## 2023-10-14 ENCOUNTER — Ambulatory Visit (INDEPENDENT_AMBULATORY_CARE_PROVIDER_SITE_OTHER): Admitting: Gastroenterology

## 2023-10-14 ENCOUNTER — Encounter (INDEPENDENT_AMBULATORY_CARE_PROVIDER_SITE_OTHER): Payer: Self-pay | Admitting: Gastroenterology

## 2023-10-14 VITALS — BP 121/79 | HR 62 | Temp 97.8°F | Ht 72.0 in | Wt 251.2 lb

## 2023-10-14 DIAGNOSIS — R194 Change in bowel habit: Secondary | ICD-10-CM | POA: Diagnosis not present

## 2023-10-14 DIAGNOSIS — K219 Gastro-esophageal reflux disease without esophagitis: Secondary | ICD-10-CM | POA: Diagnosis not present

## 2023-10-14 DIAGNOSIS — R7989 Other specified abnormal findings of blood chemistry: Secondary | ICD-10-CM

## 2023-10-14 DIAGNOSIS — R7401 Elevation of levels of liver transaminase levels: Secondary | ICD-10-CM | POA: Diagnosis not present

## 2023-10-14 DIAGNOSIS — R197 Diarrhea, unspecified: Secondary | ICD-10-CM

## 2023-10-14 NOTE — Progress Notes (Unsigned)
 Seth Graves, M.D. Gastroenterology & Hepatology [facility] Gastroenterology [address] [place], [place] [address]  Primary Care Physician: Marvine Rush, MD [address] [place] [address]  I will communicate my assessment and recommendations to the referring MD via EMR.  Problems: GERD Change in bowel habits Elevated aminotransferases  History of Present Illness: Seth Graves is a 63 y.o. male with past medical history of CAD and NSTEMI s/p stents on DAPT, HTN, gout, CKD  who presents for follow up of GERD and change in bowel habits.  The patient was last seen on 11/20/2018 for. At that time, the patient was continued on omeprazole  40 mg daily.  He had 2 isolated episodes of diarrhea which were associated to specific food intake.  Patient reports that for the last 4 weeks he has presented change in his bowel movements. He reports that he is having soft to explosive watery diarrhea. States he feels the pressure in his lower abdomen and has a bowel movement after he feels this pain. He is having at least 2 bowel movements per day. He may have had a few days when stools were close to normal than in the past. No nighttime episodes. No urgency. The patient denies having any nausea, vomiting, fever, chills, hematochezia, melena, hematemesis, abdominal distention, abdominal pain, jaundice, pruritus. Has lost some weight - possibly 13 lb but does not know exactly.   States he has not changed his diet.  Last CMP in 06/27/23 showed mild elevation of AST 65, ALT 62, alkaline phosphatase 3.6, creatinine 1.32, BUN 11, alkaline phosphatase 55, total bili 0.6.  Previously, his AST was 35 and ALT 38 (2023).  Currently taking omeprazole  40 mg daily for GERD with adequate control of heartburn.  Last Colonoscopy:03/2022  - Two 3 to 5 mm polyps in the transverse colon-hyperplastic polyps                            - Diverticulosis in the sigmoid colon.                            - The distal rectum and anal verge are normal on                            retroflexion view. Last Endoscopy: 03/2022 - Z-line irregular, 41 cm from the incisors.                           - 1 cm hiatal hernia.                           - Normal stomach.                           - Normal examined duodenum.                           - No specimens collected.   Recommendations:  Repeat TCS 10 years   Past Medical History: Past Medical History:  Diagnosis Date   Arthritis    CKD (chronic kidney disease)    Coronary artery disease    NSTEMI 05/2012 s/p PTCA/DES of the mid RCA, ramus intermediate, and mid left circumflex, LVEF  55-60%   Essential hypertension, benign    Gout 06/11/2012   Hypercholesteremia    Hyperglycemia    NSTEMI (non-ST elevated myocardial infarction) [facility])    February 2014    Past Surgical History: Past Surgical History:  Procedure Laterality Date   BACK SURGERY     COLONOSCOPY WITH PROPOFOL  N/A 03/23/2022   Procedure: COLONOSCOPY WITH PROPOFOL ;  Surgeon: Eartha Angelia Sieving, MD;  [facility];  Service: Gastroenterology;  Laterality: N/A;  730 ASA 2   ESOPHAGOGASTRODUODENOSCOPY (EGD) WITH PROPOFOL  N/A 03/23/2022   Procedure: ESOPHAGOGASTRODUODENOSCOPY (EGD) WITH PROPOFOL ;  Surgeon: Eartha Angelia Sieving, MD;  Location: [facility];  Service: Gastroenterology;  Laterality: N/A;   HERNIA REPAIR     LEFT HEART CATHETERIZATION WITH CORONARY ANGIOGRAM N/A 06/04/2012   Procedure: LEFT HEART CATHETERIZATION WITH CORONARY ANGIOGRAM;  Surgeon: Peter M Swaziland, MD;  Location: [facility] CATH LAB;  Service: Cardiovascular;  Laterality: N/A;   NECK SURGERY     PERCUTANEOUS CORONARY STENT INTERVENTION (PCI-S)  06/04/2012   Procedure: PERCUTANEOUS CORONARY STENT INTERVENTION (PCI-S);  Surgeon: Peter M Swaziland, MD;  Location: [facility] CATH LAB;  Service: Cardiovascular;;   POLYPECTOMY  03/23/2022   Procedure: POLYPECTOMY;  Surgeon: Eartha Angelia,  Sieving, MD;  Location: [facility];  Service: Gastroenterology;;    Family History: Family History  Problem Relation Age of Onset   CAD Father        MI in his 37s   CVA Maternal Grandmother    Aneurysm Maternal Grandfather    Heart attack Paternal Grandmother    Alzheimer's disease Paternal Grandmother    Heart attack Paternal Grandfather     Social History: Social History   Tobacco Use  Smoking Status Former   Current packs/day: 0.00   Types: Cigarettes   Start date: 04/16/1980   Quit date: 04/16/2001   Years since quitting: 22.5  Smokeless Tobacco Never   Social History   Substance and Sexual Activity  Alcohol Use No   Alcohol/week: 0.0 standard drinks of alcohol   Social History   Substance and Sexual Activity  Drug Use No    Allergies: Allergies  Allergen Reactions   Lipitor  [Atorvastatin ] Other (See Comments)    myalgias   Demerol [Meperidine Hcl] Nausea And Vomiting    Medications: Current Outpatient Medications  Medication Sig Dispense Refill   acetaminophen  (TYLENOL ) 500 MG tablet Take 500-1,000 mg by mouth every 6 (six) hours as needed (pain.).     allopurinol (ZYLOPRIM) 100 MG tablet Take 100 mg by mouth at bedtime. 100 mg + 300 mg=400 mg at night     amLODipine  (NORVASC ) 10 MG tablet Take 1 tablet (10 mg total) by mouth daily. 90 tablet 3   Ascorbic Acid (VITAMIN C) 500 MG CHEW Chew 500 mg by mouth at bedtime.     aspirin  EC 81 MG EC tablet Take 1 tablet (81 mg total) by mouth daily.     cholecalciferol (VITAMIN D3) 25 MCG (1000 UNIT) tablet Take 1,000 Units by mouth daily.     clopidogrel  (PLAVIX ) 75 MG tablet Take 1 tablet (75 mg total) by mouth daily. 90 tablet 3   Cyanocobalamin (B-12 PO) Take by mouth daily.     metoprolol  succinate (TOPROL -XL) 50 MG 24 hr tablet Take 1 tablet (50 mg total) by mouth daily. Take with or immediately following a meal. 90 tablet 3   Multiple Vitamin (MULTIVITAMIN) tablet Take 1 tablet by mouth at bedtime.      nitroGLYCERIN  (NITROSTAT ) 0.4 MG  SL tablet Place 1 tablet (0.4 mg total) under the tongue every 5 (five) minutes as needed for chest pain (up to 3 doses). 25 tablet 4   olmesartan (BENICAR) 40 MG tablet Take 40 mg by mouth daily.  3   pantoprazole  (PROTONIX ) 40 MG tablet Take 1 tablet (40 mg total) by mouth daily. 90 tablet 3   rosuvastatin  (CRESTOR ) 10 MG tablet TAKE 1 TABLET BY MOUTH EVERY OTHER DAY 45 tablet 3   allopurinol (ZYLOPRIM) 300 MG tablet Take 300 mg by mouth at bedtime. 300 mg + 100 mg=400 mg at night     escitalopram (LEXAPRO) 10 MG tablet Take 10 mg by mouth in the morning.     No current facility-administered medications for this visit.    Review of Systems: GENERAL: negative for malaise, night sweats HEENT: No changes in hearing or vision, no nose bleeds or other nasal problems. NECK: Negative for lumps, goiter, pain and significant neck swelling RESPIRATORY: Negative for cough, wheezing CARDIOVASCULAR: Negative for chest pain, leg swelling, palpitations, orthopnea GI: SEE HPI MUSCULOSKELETAL: Negative for joint pain or swelling, back pain, and muscle pain. SKIN: Negative for lesions, rash PSYCH: Negative for sleep disturbance, mood disorder and recent psychosocial stressors. HEMATOLOGY Negative for prolonged bleeding, bruising easily, and swollen nodes. ENDOCRINE: Negative for cold or heat intolerance, polyuria, polydipsia and goiter. NEURO: negative for tremor, gait imbalance, syncope and seizures. The remainder of the review of systems is noncontributory.   Physical Exam: BP 121/79 (BP Location: Right Arm, Patient Position: Sitting, Cuff Size: Large)   Pulse 62   Temp 97.8 F (36.6 C) (Temporal)   Ht 6' (1.829 m)   Wt 251 lb 3.2 oz (113.9 kg)   BMI 34.07 kg/m  GENERAL: The patient is AO x3, in no acute distress. HEENT: Head is normocephalic and atraumatic. EOMI are intact. Mouth is well hydrated and without lesions. NECK: Supple. No masses LUNGS: Clear to  auscultation. No presence of rhonchi/wheezing/rales. Adequate chest expansion HEART: RRR, normal s1 and s2. ABDOMEN: Soft, nontender, no guarding, no peritoneal signs, and nondistended. BS +. No masses. EXTREMITIES: Without any cyanosis, clubbing, rash, lesions or edema. NEUROLOGIC: AOx3, no focal motor deficit. SKIN: no jaundice, no rashes  Imaging/Labs: as above  I personally reviewed and interpreted the available labs, imaging and endoscopic files.  Impression and Plan: Seth Graves is a 63 y.o. male with past medical history of CAD and NSTEMI s/p stents on DAPT, HTN, gout, CKD  who presents for follow up of GERD and change in bowel habits.  Patient has presented new change in his bowel movement frequency and consistency recently.  This has not been attributed to any specific dietary intake -I encouraged the patient to keep a food diary that can help guide the possible triggers for his symptoms.  We will evaluate for organic etiologies with blood workup and stool testing.  I also encouraged patient to start taking fiber supplements to increase the bulk of the stool.  In terms of his GERD, at this is adequately controlled while on omeprazole  4 mg daily, which he should continue taking for now.  Finally, he has presented new aminotransferase elevation.  It is unclear if this is a transient elevation or could be related to other etiologies such as NASH.  Will repeat a CMP in his follow-up appointment.  -Check repeat CMP in follow-up appointment - Celiac disease panel and TSH - GI pathogen panel, C. difficile, fecal fat, fecal elastase -Start Benefiber fiber supplements 1  tablespoon daily to increase stool bulk -Try to keep a food diary -Continue omeprazole  40 mg every day - If persistent symptoms, we will need to proceed with a colonoscopy.  All questions were answered.      Seth Fortune, MD Gastroenterology and Hepatology [facility] Gastroenterology

## 2023-10-14 NOTE — Progress Notes (Unsigned)
 Seth Graves

## 2023-10-14 NOTE — Patient Instructions (Addendum)
 Perform blood workup Perform stool workup Start Benefiber fiber supplements 1 tablespoon daily to increase stool bulk Try to keep a food diary Continue omeprazole  40 mg every day

## 2023-10-15 LAB — CELIAC DISEASE PANEL
(tTG) Ab, IgA: <1 U/mL
(tTG) Ab, IgG: <1 U/mL
Gliadin IgA: <1 U/mL
Gliadin IgG: <1 U/mL
Immunoglobulin A: 293 mg/dL (ref 70–320)

## 2023-10-15 LAB — TSH: TSH: 1.13 m[IU]/L (ref 0.40–4.50)

## 2023-10-16 DIAGNOSIS — R7989 Other specified abnormal findings of blood chemistry: Secondary | ICD-10-CM | POA: Insufficient documentation

## 2023-10-21 LAB — GASTROINTESTINAL PATHOGEN PNL
CampyloBacter Group: NOT DETECTED
Norovirus GI/GII: NOT DETECTED
Rotavirus A: NOT DETECTED
Salmonella species: NOT DETECTED
Shiga Toxin 1: NOT DETECTED
Shiga Toxin 2: NOT DETECTED
Shigella Species: NOT DETECTED
Vibrio Group: NOT DETECTED
Yersinia enterocolitica: NOT DETECTED

## 2023-10-21 LAB — C. DIFFICILE GDH AND TOXIN A/B
GDH ANTIGEN: NOT DETECTED
MICRO NUMBER:: 16650964
SPECIMEN QUALITY:: ADEQUATE
TOXIN A AND B: NOT DETECTED

## 2023-10-21 LAB — FECAL FAT, QUALITATIVE: FECAL FAT, QUALITATIVE: ABNORMAL — AB

## 2023-10-21 LAB — PANCREATIC ELASTASE, FECAL: Pancreatic Elastase-1, Stool: >800 ug/g (ref >200)

## 2023-10-23 ENCOUNTER — Ambulatory Visit (INDEPENDENT_AMBULATORY_CARE_PROVIDER_SITE_OTHER): Payer: Self-pay | Admitting: Gastroenterology

## 2023-10-30 ENCOUNTER — Encounter: Payer: Self-pay | Admitting: Family Medicine

## 2023-10-30 ENCOUNTER — Ambulatory Visit: Admitting: Family Medicine

## 2023-10-30 VITALS — BP 124/82 | HR 64 | Temp 98.6°F | Ht 72.0 in | Wt 253.0 lb

## 2023-10-30 DIAGNOSIS — I2510 Atherosclerotic heart disease of native coronary artery without angina pectoris: Secondary | ICD-10-CM | POA: Diagnosis not present

## 2023-10-30 DIAGNOSIS — R7989 Other specified abnormal findings of blood chemistry: Secondary | ICD-10-CM

## 2023-10-30 DIAGNOSIS — G8929 Other chronic pain: Secondary | ICD-10-CM

## 2023-10-30 DIAGNOSIS — E1165 Type 2 diabetes mellitus with hyperglycemia: Secondary | ICD-10-CM

## 2023-10-30 DIAGNOSIS — Z130 Encounter for screening for diseases of the blood and blood-forming organs and certain disorders involving the immune mechanism: Secondary | ICD-10-CM | POA: Diagnosis not present

## 2023-10-30 DIAGNOSIS — I10 Essential (primary) hypertension: Secondary | ICD-10-CM

## 2023-10-30 DIAGNOSIS — M25561 Pain in right knee: Secondary | ICD-10-CM

## 2023-10-30 DIAGNOSIS — E785 Hyperlipidemia, unspecified: Secondary | ICD-10-CM

## 2023-10-30 NOTE — Patient Instructions (Signed)
 Labs ordered.  Xray at the hospital.  I will send in medication once I see your labs.  Follow up in 3 months.

## 2023-10-31 ENCOUNTER — Ambulatory Visit (HOSPITAL_COMMUNITY)
Admission: RE | Admit: 2023-10-31 | Discharge: 2023-10-31 | Disposition: A | Discharge status: Home / Self Care | Source: Ambulatory Visit | Attending: Family Medicine | Admitting: Family Medicine

## 2023-10-31 DIAGNOSIS — E1165 Type 2 diabetes mellitus with hyperglycemia: Secondary | ICD-10-CM | POA: Insufficient documentation

## 2023-10-31 DIAGNOSIS — G8929 Other chronic pain: Secondary | ICD-10-CM | POA: Diagnosis present

## 2023-10-31 DIAGNOSIS — M25561 Pain in right knee: Secondary | ICD-10-CM | POA: Insufficient documentation

## 2023-10-31 MED ORDER — METOPROLOL SUCCINATE ER 50 MG PO TB24: 50.0000 mg | ORAL_TABLET | Freq: Every day | ORAL | 3 refills | Status: DC

## 2023-10-31 MED ORDER — AMLODIPINE BESYLATE 10 MG PO TABS: 10.0000 mg | ORAL_TABLET | Freq: Every day | ORAL | 3 refills | Status: AC

## 2023-10-31 NOTE — Progress Notes (Signed)
 Subjective:  Patient ID: Seth Graves, male    DOB: Apr 17, 1960  Age: 63 y.o. MRN: [id number]  CC:   Chief Complaint  Patient presents with   New Patient (Initial Visit)    HPI:  63 year old male presents to establish care.  Patient has a known history of gout.  He is on allopurinol.    Has type 2 diabetes.  Most recent A1c 7.5.  We will discuss dietary changes and recommendations as well as medication options today.  Has known coronary disease.  Denies any current chest pain.  Does have dyspnea on exertion.  Unsure the current status of his lipids.  He is on Crestor .  He is also on aspirin , Plavix , and metoprolol .  BP stable on metoprolol , amlodipine , and olmesartan.  Patient's prior available labs reflect chronic kidney disease.  Patient reports chronic right knee pain.  He states that this has been attributed to arthritis.  He was previously prescribed Celebrex.  He has not had any imaging.  We will discuss today.  Patient reports that his other concern is his sugar which we will discuss today as indicated above.   Patient Active Problem List   Diagnosis Date Noted   Type 2 diabetes mellitus with hyperglycemia, without long-term current use of insulin (HCC) 10/31/2023   Chronic pain of right knee 10/31/2023   Elevated LFTs 10/16/2023   GERD (gastroesophageal reflux disease) 01/22/2022   Hyperlipidemia 06/11/2012   Essential hypertension, benign 06/11/2012   Gout 06/11/2012   Coronary atherosclerosis of native coronary artery 06/03/2012    Social Hx   Social History   Socioeconomic History   Marital status: Married    Spouse name: Not on file   Number of children: Not on file   Years of education: Not on file   Highest education level: Not on file  Occupational History   Not on file  Tobacco Use   Smoking status: Former    Current packs/day: 0.00    Types: Cigarettes    Start date: 04/16/1980    Quit date: 04/16/2001    Years since quitting: 22.5   Smokeless  tobacco: Never  Vaping Use   Vaping status: Never Used  Substance and Sexual Activity   Alcohol use: No    Alcohol/week: 0.0 standard drinks of alcohol   Drug use: No   Sexual activity: Yes    Birth control/protection: None  Other Topics Concern   Not on file  Social History Narrative   Not on file   Social Drivers of Health   Financial Resource Strain: Not on file  Food Insecurity: Not on file  Transportation Needs: Not on file  Physical Activity: Not on file  Stress: Not on file  Social Connections: Not on file    Review of Systems Per HPI  Objective:  BP 124/82   Pulse 64   Temp 98.6 F (37 C)   Ht 6' (1.829 m)   Wt 253 lb (114.8 kg)   SpO2 97%   BMI 34.31 kg/m      10/30/2023    2:25 PM 10/30/2023    1:35 PM 10/14/2023    2:31 PM  BP/Weight  Systolic BP 124 144 121  Diastolic BP 82 90 79  Wt. (Lbs)  253 251.2  BMI  34.31 kg/m2 34.07 kg/m2    Physical Exam Vitals and nursing note reviewed.  Constitutional:      General: He is not in acute distress.    Appearance: Normal appearance.  HENT:  Head: Normocephalic and atraumatic.  Eyes:     General:        Right eye: No discharge.        Left eye: No discharge.     Conjunctiva/sclera: Conjunctivae normal.  Cardiovascular:     Rate and Rhythm: Normal rate and regular rhythm.  Pulmonary:     Effort: Pulmonary effort is normal.     Breath sounds: Normal breath sounds. No wheezing, rhonchi or rales.  Neurological:     Mental Status: He is alert.  Psychiatric:        Mood and Affect: Mood normal.        Behavior: Behavior normal.     Lab Results  Component Value Date   WBC 6.9 06/27/2023   HGB 14.3 06/27/2023   HCT 42.4 06/27/2023   PLT 237 06/27/2023   GLUCOSE 224 (H) 06/27/2023   CHOL 155 06/27/2023   TRIG 191 (H) 06/27/2023   HDL 34 (L) 06/27/2023   LDLDIRECT 92 02/21/2022   LDLCALC 83 06/27/2023   ALT 62 (H) 06/27/2023   AST 65 (H) 06/27/2023   NA 138 06/27/2023   K 3.8 06/27/2023    CL 102 06/27/2023   CREATININE 1.32 (H) 06/27/2023   BUN 11 06/27/2023   CO2 23 06/27/2023   TSH 1.13 10/14/2023   INR 1.01 06/03/2012   HGBA1C 7.0 (H) 05/15/2022     Assessment & Plan:  Type 2 diabetes mellitus with hyperglycemia, without long-term current use of insulin (HCC) Assessment & Plan: Uncontrolled.  Most recent A1c 7.5.  Discussed lifestyle changes as well as medication options.  SGLT2 would be an ideal drug for him given his renal insufficiency as well as his cardiac disease.  Obtaining labs today.  Would like to see his renal function prior to sending in.  Orders: -     CMP14+EGFR -     Microalbumin / creatinine urine ratio  Hyperlipidemia, unspecified hyperlipidemia type -     Lipid panel  Atherosclerosis of native coronary artery of native heart without angina pectoris Assessment & Plan: Currently stable.  Continue current medications.  Orders: -     Metoprolol  Succinate ER; Take 1 tablet (50 mg total) by mouth daily. Take with or immediately following a meal.  Dispense: 90 tablet; Refill: 3  Screening for deficiency anemia -     CBC  Chronic pain of right knee Assessment & Plan: X-ray for further evaluation.  Orders: -     DG Knee Complete 4 Views Right  Essential hypertension, benign Assessment & Plan: Stable.  Continue current medications.  Orders: -     amLODIPine  Besylate; Take 1 tablet (10 mg total) by mouth daily.  Dispense: 90 tablet; Refill: 3 -     Metoprolol  Succinate ER; Take 1 tablet (50 mg total) by mouth daily. Take with or immediately following a meal.  Dispense: 90 tablet; Refill: 3  Elevated LFTs Assessment & Plan: Noted on prior labs.  No appreciable workup has been obtained.  Reassessing today with current metabolic panel.     Follow-up: 3 months  Loistine Eberlin Bluford DO [facility]

## 2023-10-31 NOTE — Assessment & Plan Note (Signed)
X-ray for further evaluation.

## 2023-10-31 NOTE — Assessment & Plan Note (Signed)
 Noted on prior labs.  No appreciable workup has been obtained.  Reassessing today with current metabolic panel.

## 2023-10-31 NOTE — Assessment & Plan Note (Signed)
 Uncontrolled.  Most recent A1c 7.5.  Discussed lifestyle changes as well as medication options.  SGLT2 would be an ideal drug for him given his renal insufficiency as well as his cardiac disease.  Obtaining labs today.  Would like to see his renal function prior to sending in.

## 2023-10-31 NOTE — Assessment & Plan Note (Signed)
Currently stable. Continue current medications. 

## 2023-10-31 NOTE — Assessment & Plan Note (Signed)
 Stable.  Continue current medications.

## 2023-11-01 LAB — CBC
Hematocrit: 42.2 % (ref 37.5–51.0)
Hemoglobin: 14.1 g/dL (ref 13.0–17.7)
MCH: 28.7 pg (ref 26.6–33.0)
MCHC: 33.4 g/dL (ref 31.5–35.7)
MCV: 86 fL (ref 79–97)
Platelets: 259 x10E3/uL (ref 150–450)
RBC: 4.91 x10E6/uL (ref 4.14–5.80)
RDW: 13.6 % (ref 11.6–15.4)
WBC: 7.4 x10E3/uL (ref 3.4–10.8)

## 2023-11-01 LAB — CMP14+EGFR
ALT: 20 IU/L (ref 0–44)
AST: 20 IU/L (ref 0–40)
Albumin: 4.5 g/dL (ref 3.9–4.9)
Alkaline Phosphatase: 67 IU/L (ref 44–121)
BUN/Creatinine Ratio: 13 (ref 10–24)
BUN: 18 mg/dL (ref 8–27)
Bilirubin Total: 0.4 mg/dL (ref 0.0–1.2)
CO2: 23 mmol/L (ref 20–29)
Calcium: 9.4 mg/dL (ref 8.6–10.2)
Chloride: 102 mmol/L (ref 96–106)
Creatinine, Ser: 1.42 mg/dL — ABNORMAL HIGH (ref 0.76–1.27)
Globulin, Total: 2.4 g/dL (ref 1.5–4.5)
Glucose: 125 mg/dL — ABNORMAL HIGH (ref 70–99)
Potassium: 4.3 mmol/L (ref 3.5–5.2)
Sodium: 140 mmol/L (ref 134–144)
Total Protein: 6.9 g/dL (ref 6.0–8.5)
eGFR: 56 mL/min/1.73 — ABNORMAL LOW (ref >59)

## 2023-11-01 LAB — LIPID PANEL
Chol/HDL Ratio: 4.6 ratio (ref 0.0–5.0)
Cholesterol, Total: 157 mg/dL (ref 100–199)
HDL: 34 mg/dL — ABNORMAL LOW (ref >39)
LDL Chol Calc (NIH): 98 mg/dL (ref 0–99)
Triglycerides: 141 mg/dL (ref 0–149)
VLDL Cholesterol Cal: 25 mg/dL (ref 5–40)

## 2023-11-01 LAB — MICROALBUMIN / CREATININE URINE RATIO
Creatinine, Urine: 133.8 mg/dL
Microalb/Creat Ratio: 361 mg/g{creat} — ABNORMAL HIGH (ref 0–29)
Microalbumin, Urine: 482.9 ug/mL

## 2023-11-03 ENCOUNTER — Ambulatory Visit: Payer: Self-pay | Admitting: Family Medicine

## 2023-11-03 ENCOUNTER — Other Ambulatory Visit: Payer: Self-pay | Admitting: Family Medicine

## 2023-11-03 DIAGNOSIS — N183 Chronic kidney disease, stage 3 unspecified: Secondary | ICD-10-CM | POA: Insufficient documentation

## 2023-11-03 DIAGNOSIS — N1831 Chronic kidney disease, stage 3a: Secondary | ICD-10-CM

## 2023-11-03 MED ORDER — EMPAGLIFLOZIN 10 MG PO TABS: 10.0000 mg | ORAL_TABLET | Freq: Every day | ORAL | 3 refills | Status: AC

## 2023-11-04 ENCOUNTER — Telehealth: Payer: Self-pay | Admitting: Pharmacy Technician

## 2023-11-04 ENCOUNTER — Other Ambulatory Visit (HOSPITAL_COMMUNITY): Payer: Self-pay

## 2023-11-04 NOTE — Telephone Encounter (Signed)
 Pharmacy Patient Advocate Encounter  Received notification from [employer] that Prior Authorization for Jardiance  10mg  tablets  has been APPROVED from 11/04/2023 to 11/03/2024. Ran test claim, Copay is $29.99/90 day supply. This test claim was processed through [facility]- copay amounts may vary at other pharmacies due to pharmacy/plan contracts, or as the patient moves through the different stages of their insurance plan.   PA #/Case ID/Reference #: [id number]

## 2023-11-04 NOTE — Telephone Encounter (Signed)
 Pharmacy Patient Advocate Encounter   Received notification from Onbase that prior authorization for Jardiance  10mg  tablets is required/requested.   Insurance verification completed.   The patient is insured through [employer] .   Per test claim: PA required; PA submitted to above mentioned insurance via LATENT Key/confirmation #/EOC [id number] Status is pending

## 2023-11-21 ENCOUNTER — Ambulatory Visit (INDEPENDENT_AMBULATORY_CARE_PROVIDER_SITE_OTHER): Payer: Managed Care, Other (non HMO) | Admitting: Gastroenterology

## 2023-11-21 ENCOUNTER — Telehealth (INDEPENDENT_AMBULATORY_CARE_PROVIDER_SITE_OTHER): Payer: Self-pay

## 2023-11-21 NOTE — Telephone Encounter (Signed)
 Patient called today stating he was seen by us  in June and was told to increase fiber in diet. He says he has went through an entire bottle of fiber and he is still having the same issues with loose mud like consistency stools, he has gas, bloating and lower abdominal discomfort and there has been no improvement with this symptoms. He would like to know what the next steps are that he needs to try. Patient uses [facility]. Please advise. Thanks,

## 2023-11-22 NOTE — Telephone Encounter (Signed)
 Hi, For review of his medical record, I noticed that he has been taking olmesartan (Benicar).  This medication is known to cause diarrhea in patients.  I will encourage him to discuss as soon as possible with his PCP if it is possible to switch this medication for other medication for blood pressure control.  We can in the meantime proceed with a repeat EGD and colonoscopy to evaluate other reasons for diarrhea.  If he is agreeable to proceed with this, please let Mindy/Tanya know.  He can be done in room 1.  Diagnosis chronic diarrhea. Thanks

## 2023-12-09 ENCOUNTER — Ambulatory Visit (INDEPENDENT_AMBULATORY_CARE_PROVIDER_SITE_OTHER): Admitting: Gastroenterology

## 2023-12-09 ENCOUNTER — Encounter (INDEPENDENT_AMBULATORY_CARE_PROVIDER_SITE_OTHER): Payer: Self-pay | Admitting: Gastroenterology

## 2023-12-09 VITALS — BP 137/86 | HR 67 | Temp 97.8°F | Ht 72.0 in | Wt 237.7 lb

## 2023-12-09 DIAGNOSIS — K909 Intestinal malabsorption, unspecified: Secondary | ICD-10-CM | POA: Diagnosis not present

## 2023-12-09 DIAGNOSIS — R197 Diarrhea, unspecified: Secondary | ICD-10-CM | POA: Diagnosis not present

## 2023-12-09 MED ORDER — COLESTIPOL HCL 1 G PO TABS: 2.0000 g | ORAL_TABLET | Freq: Two times a day (BID) | ORAL | 1 refills | Status: DC

## 2023-12-09 NOTE — Progress Notes (Signed)
 Referring Provider: Marvine Rush, MD Primary Care Physician:  Cook, Jayce G, DO Primary GI Physician: Dr. Eartha   Chief Complaint  Patient presents with   Follow-up    Patient here today for a follow up. Patient says he is having issues looser stools, and gas. Patient says the gerd is controlled on pantoprazole  40 mg once per day. He says his last liver functions were elevated.   HPI:   Seth Graves is a 63 y.o. male with past medical history of CAD and NSTEMI s/p stents on DAPT, HTN, gout, CKD   Patient presenting today for:  Loose stools/steatorrhea   Last seen in June by Dr. Eartha, at that time having diarrhea, pressure in lower abdomen. No changes in diet. Some elevation of LFTs, taking omeprazole  40mg  daily for GERD with good control of symptoms  Recommeneded repeat CMP at follow up, TSH, celiac panel, GI pathogen, C diff, fecal fat pancreatic elastase, start benefiber 1T daily, food diary, continue omeprazole  40mg  daily, consider repeat Colonoscopy if diarrhea persist  Labs on 10/15/23: Celiac panel negative C diff negative GI pathogen panel negative Fecal fat abnormal  Pancreatic elastase >800 TSH 1.13  Repeat CMP on 7/17 with normalization of LFTs   Present:  Reports diarrhea that came on suddenly about 2 months ago. He states that stools are not watery but also not formed. He notes a lot of gas as well as looser stools. He has some lower abdominal cramping usually prior to having a BM. Has not been able to pinpoint any specific foods that tend to make symptoms worse. He notes some gas and bloating as well. Not taking anything for this.  Usually eats more chicken, tuna and veggies, some red meat on the weekends. States stools float and look fatty, often has to flush more than once to get the stools to go down. States that the only med changes he recalls his cardiology taking him off prilosec and starting protonix  due to being on plavix , notes he thinks stools changed  shortly thereafter. GB is in situ. No early satiety. Usually has a BM within an hour or so of eating, though generally has a BM BID during the week when working, on a day he is not working he may go more, today has had about 4 BMs. Does tend to eat a little more on days he Is not working.   GERD is well controlled on protonix  40mg  daily.   Last Colonoscopy:03/2022  - Two 3 to 5 mm polyps in the transverse colon-hyperplastic polyps                            - Diverticulosis in the sigmoid colon.                           - The distal rectum and anal verge are normal on                            retroflexion view. Last Endoscopy: 03/2022 - Z-line irregular, 41 cm from the incisors.                           - 1 cm hiatal hernia.                           -  Normal stomach.                           - Normal examined duodenum.                           - No specimens collected.   Recommendations:  Repeat TCS 10 years    Filed Weights   12/09/23 1420  Weight: 237 lb 11.2 oz (107.8 kg)     Past Medical History:  Diagnosis Date   Arthritis    CKD (chronic kidney disease)    Coronary artery disease    NSTEMI 05/2012 s/p PTCA/DES of the mid RCA, ramus intermediate, and mid left circumflex, LVEF 55-60%   Essential hypertension, benign    Gout 06/11/2012   Hypercholesteremia    Hyperglycemia    NSTEMI (non-ST elevated myocardial infarction) [facility])    February 2014    Past Surgical History:  Procedure Laterality Date   BACK SURGERY     COLONOSCOPY WITH PROPOFOL  N/A 03/23/2022   Procedure: COLONOSCOPY WITH PROPOFOL ;  Surgeon: Eartha Angelia Sieving, MD;  [facility];  Service: Gastroenterology;  Laterality: N/A;  730 ASA 2   ESOPHAGOGASTRODUODENOSCOPY (EGD) WITH PROPOFOL  N/A 03/23/2022   Procedure: ESOPHAGOGASTRODUODENOSCOPY (EGD) WITH PROPOFOL ;  Surgeon: Eartha Angelia Sieving, MD;  [facility];  Service: Gastroenterology;  Laterality: N/A;   HERNIA REPAIR      LEFT HEART CATHETERIZATION WITH CORONARY ANGIOGRAM N/A 06/04/2012   Procedure: LEFT HEART CATHETERIZATION WITH CORONARY ANGIOGRAM;  Surgeon: Peter M Swaziland, MD;  Location: [facility] CATH LAB;  Service: Cardiovascular;  Laterality: N/A;   NECK SURGERY     PERCUTANEOUS CORONARY STENT INTERVENTION (PCI-S)  06/04/2012   Procedure: PERCUTANEOUS CORONARY STENT INTERVENTION (PCI-S);  Surgeon: Peter M Swaziland, MD;  Location: [facility] CATH LAB;  Service: Cardiovascular;;   POLYPECTOMY  03/23/2022   Procedure: POLYPECTOMY;  Surgeon: Eartha Angelia, Sieving, MD;  [facility];  Service: Gastroenterology;;    Current Outpatient Medications  Medication Sig Dispense Refill   acetaminophen  (TYLENOL ) 500 MG tablet Take 500-1,000 mg by mouth every 6 (six) hours as needed (pain.).     allopurinol (ZYLOPRIM) 100 MG tablet Take 100 mg by mouth at bedtime. 100 mg + 300 mg=400 mg at night     amLODipine  (NORVASC ) 10 MG tablet Take 1 tablet (10 mg total) by mouth daily. 90 tablet 3   aspirin  EC 81 MG EC tablet Take 1 tablet (81 mg total) by mouth daily.     cholecalciferol (VITAMIN D3) 25 MCG (1000 UNIT) tablet Take 1,000 Units by mouth daily.     clopidogrel  (PLAVIX ) 75 MG tablet Take 1 tablet (75 mg total) by mouth daily. 90 tablet 3   Cyanocobalamin (B-12 PO) Take by mouth daily.     empagliflozin  (JARDIANCE ) 10 MG TABS tablet Take 1 tablet (10 mg total) by mouth daily before breakfast. 90 tablet 3   escitalopram (LEXAPRO) 10 MG tablet Take 10 mg by mouth in the morning.     metoprolol  succinate (TOPROL -XL) 50 MG 24 hr tablet Take 1 tablet (50 mg total) by mouth daily. Take with or immediately following a meal. 90 tablet 3   Multiple Vitamin (MULTIVITAMIN) tablet Take 1 tablet by mouth at bedtime.     nitroGLYCERIN  (NITROSTAT ) 0.4 MG SL tablet Place 1 tablet (0.4 mg total) under the tongue every 5 (five) minutes as needed for chest pain (up  to 3 doses). 25 tablet 4   olmesartan (BENICAR) 40 MG tablet Take 40 mg by  mouth daily.  3   pantoprazole  (PROTONIX ) 40 MG tablet Take 1 tablet (40 mg total) by mouth daily. 90 tablet 3   rosuvastatin  (CRESTOR ) 10 MG tablet TAKE 1 TABLET BY MOUTH EVERY OTHER DAY 45 tablet 3   Ascorbic Acid (VITAMIN C) 500 MG CHEW Chew 500 mg by mouth at bedtime. (Patient not taking: Reported on 12/09/2023)     No current facility-administered medications for this visit.    Allergies as of 12/09/2023 - Review Complete 12/09/2023  Allergen Reaction Noted   Lipitor  [atorvastatin ] Other (See Comments) 07/27/2015   Demerol [meperidine hcl] Nausea And Vomiting 12/26/2020    Social History   Socioeconomic History   Marital status: Married    Spouse name: Not on file   Number of children: Not on file   Years of education: Not on file   Highest education level: Not on file  Occupational History   Not on file  Tobacco Use   Smoking status: Former    Current packs/day: 0.00    Types: Cigarettes    Start date: 04/16/1980    Quit date: 04/16/2001    Years since quitting: 22.6   Smokeless tobacco: Never  Vaping Use   Vaping status: Never Used  Substance and Sexual Activity   Alcohol use: No    Alcohol/week: 0.0 standard drinks of alcohol   Drug use: No   Sexual activity: Yes    Birth control/protection: None  Other Topics Concern   Not on file  Social History Narrative   Not on file   Social Drivers of Health   Financial Resource Strain: Not on file  Food Insecurity: Not on file  Transportation Needs: Not on file  Physical Activity: Not on file  Stress: Not on file  Social Connections: Not on file    Review of systems General: negative for malaise, night sweats, fever, chills+weight loss  Neck: Negative for lumps, goiter, pain and significant neck swelling Resp: Negative for cough, wheezing, dyspnea at rest CV: Negative for chest pain, leg swelling, palpitations, orthopnea GI: denies melena, hematochezia, nausea, vomiting, constipation, dysphagia, odyonophagia,  early satiety +loose/fatty stools +weight loss  MSK: Negative for joint pain or swelling, back pain, and muscle pain. Derm: Negative for itching or rash Psych: Denies depression, anxiety, memory loss, confusion. No homicidal or suicidal ideation.  Heme: Negative for prolonged bleeding, bruising easily, and swollen nodes. Endocrine: Negative for cold or heat intolerance, polyuria, polydipsia and goiter. Neuro: negative for tremor, gait imbalance, syncope and seizures. The remainder of the review of systems is noncontributory.  Physical Exam: BP 137/86 (BP Location: Left Arm, Patient Position: Sitting, Cuff Size: Normal)   Pulse 67   Temp 97.8 F (36.6 C) (Temporal)   Ht 6' (1.829 m)   Wt 237 lb 11.2 oz (107.8 kg)   BMI 32.24 kg/m  General:   Alert and oriented. No distress noted. Pleasant and cooperative.  Head:  Normocephalic and atraumatic. Eyes:  Conjuctiva clear without scleral icterus. Mouth:  Oral mucosa pink and moist. Good dentition. No lesions. Heart: Normal rate and rhythm, s1 and s2 heart sounds present.  Lungs: Clear lung sounds in all lobes. Respirations equal and unlabored. Abdomen:  +BS, soft, non-tender and non-distended. No rebound or guarding. No HSM or masses noted. Derm: No palmar erythema or jaundice Msk:  Symmetrical without gross deformities. Normal posture. Extremities:  Without edema. Neurologic:  Alert  and  oriented x4 Psych:  Alert and cooperative. Normal mood and affect.  Invalid input(s): 6 MONTHS   ASSESSMENT: KILAN BANFILL is a 63 y.o. male presenting today for follow up of loose stools/steatorrhea  Onset of looser/fatty stools about 2 months ago. Testing as above remarkable for elevate fat content in the stool, pancreatic elastase was >800. GB is in situ. He notes floating/fatty stools. He has had some weight loss as well, notably down from 251lbs to 237lbs since June. Reassuringly he had a colonoscopy in December 2023, would have a very low  suspicion for presence of colonic malignancy causing his symptoms, cannot rule out microscopic colitis, though clinical presentation is not exactly fitting for this. We could still be dealing with some aspect of EPI despite normal elastase as this is not always abnormal in mild cases of EPI, though Query if we are dealing with bile acid diarrhea given elevation of fat in the stools and weight loss which can be seen in setting of malabsorption. Given his pancreatic elastase was normal, will star with treatment for BAD with Colestipol  2g BID.    PLAN:  -colestipol  2g BID, take 4 hours apart from all other meds -pt to update me on symptoms in about 10 days  -continue protonix  40mg  daily -will need to consider further testing/possible colonoscopy if symptoms persist  All questions were answered, patient verbalized understanding and is in agreement with plan as outlined above.   Follow Up: 2 months   Amberlynn Tempesta L. Marvene Strohm, MSN, APRN, AGNP-C Adult-Gerontology Nurse Practitioner [facility]  I have reviewed the note and agree with the APP's assessment as described in this progress note  If negative investigation, including normal colonoscopy with random biopsies, may benefit from Xifaxan course  Toribio Fortune, MD Gastroenterology and Hepatology [facility]

## 2023-12-09 NOTE — Patient Instructions (Signed)
 I am starting you on colestipol  2g twice daily, as discussed, take this about 4 hours apart from your other medications Please let me know how symptoms are doing in the next 10 days or so  Follow up 2 months  It was a pleasure to see you today. I want to create trusting relationships with patients and provide genuine, compassionate, and quality care. I truly value your feedback! please be on the lookout for a survey regarding your visit with me today. I appreciate your input about our visit and your time in completing this!    Christop Hippert L. Dwayne Bulkley, MSN, APRN, AGNP-C Adult-Gerontology Nurse Practitioner [facility] Gastroenterology at [facility]

## 2024-01-29 ENCOUNTER — Encounter (INDEPENDENT_AMBULATORY_CARE_PROVIDER_SITE_OTHER): Payer: Self-pay | Admitting: Gastroenterology

## 2024-01-30 ENCOUNTER — Other Ambulatory Visit (INDEPENDENT_AMBULATORY_CARE_PROVIDER_SITE_OTHER): Payer: Self-pay | Admitting: Gastroenterology

## 2024-02-10 ENCOUNTER — Ambulatory Visit (INDEPENDENT_AMBULATORY_CARE_PROVIDER_SITE_OTHER): Admitting: Gastroenterology

## 2024-02-10 ENCOUNTER — Encounter (INDEPENDENT_AMBULATORY_CARE_PROVIDER_SITE_OTHER): Payer: Self-pay | Admitting: Gastroenterology

## 2024-02-10 VITALS — BP 147/74 | HR 59 | Temp 98.1°F | Ht 72.0 in | Wt 235.3 lb

## 2024-02-10 DIAGNOSIS — R140 Abdominal distension (gaseous): Secondary | ICD-10-CM | POA: Diagnosis not present

## 2024-02-10 DIAGNOSIS — R109 Unspecified abdominal pain: Secondary | ICD-10-CM | POA: Diagnosis not present

## 2024-02-10 DIAGNOSIS — R634 Abnormal weight loss: Secondary | ICD-10-CM | POA: Insufficient documentation

## 2024-02-10 DIAGNOSIS — R1030 Lower abdominal pain, unspecified: Secondary | ICD-10-CM | POA: Insufficient documentation

## 2024-02-10 DIAGNOSIS — R197 Diarrhea, unspecified: Secondary | ICD-10-CM | POA: Diagnosis not present

## 2024-02-10 NOTE — Patient Instructions (Signed)
 Let's try increasing colestipol  to 3g (3 tablets) twice daily, continue to take 4 hours apart from other medications We will get a CT of your abdomen given ongoing abdominal pain and weight loss You can try over the counter IBgard to help with gas, this a peppermint based therapy   Follow up 3 months  It was a pleasure to see you today. I want to create trusting relationships with patients and provide genuine, compassionate, and quality care. I truly value your feedback! please be on the lookout for a survey regarding your visit with me today. I appreciate your input about our visit and your time in completing this!    Delane Wessinger L. Jaishon Krisher, MSN, APRN, AGNP-C Adult-Gerontology Nurse Practitioner [facility] Gastroenterology at [facility]

## 2024-02-10 NOTE — Progress Notes (Addendum)
 Referring Provider: Cook, Jayce G, DO Primary Care Physician:  Cook, Jayce G, DO Primary GI Physician: Dr. Eartha   Chief Complaint  Patient presents with   Follow-up    Pt arrives for follow up. Pt has noticed with the pills, his trips to bathroom are more stretched out. Still having back and forth (diarrhea and/or formed). Pt does have bloating/discomfort and gas in the morning. Pt reports that after eating he will have pain/discomfort in the lower abdomen. Reports eating lighter than normal;staying away from leafy veggies. Pt reports he has noticed if he eats heavy, he feels bad. If pt eats a lot of nuts, they tend to increase bowel movements.     HPI:   Seth Graves is a 63 y.o. male with past medical history of  CAD and NSTEMI s/p stents on DAPT, HTN, gout, CKD   Patient presenting today for follow up of: Loose stools Bloating Weight loss  Abdominal pain   Last seen August, at that time stools remained looser, some abdominal cramping. Endorses floating/fatty stools. GERD well controlled on protonix  40mg  daily   Recommended to start colestipol  2g BID, continue protonix  40mg  daily   Present:  Doing some better since starting colestipol  2g BID. Having intermittent solid stools, still with some looser stools. He endorses ongoing bloating, and gas, he notes this is present upon waking, which seems to improve as the day goes on. He is not taking anything for the gas. Notes that usually when he has a large amount of gas built up he will usually have a larger BM following this.  He has continued lower abdominal discomfort that seems to be less with smaller, periodic meals vs. Heavier meals. Previously he was having diarrhea all throughout the day.  No rectal bleeding or melena. He has lost about 36 pounds since march, started jardiance  about 3 months ago. He has noticed that more greasy/fatty foods tend to give him more diarrhea.   Labs on 10/15/23: Celiac panel negative C diff  negative GI pathogen panel negative Fecal fat abnormal  Pancreatic elastase >800 TSH 1.13  Last Colonoscopy:03/2022  - Two 3 to 5 mm polyps in the transverse colon-hyperplastic polyps                            - Diverticulosis in the sigmoid colon.                           - The distal rectum and anal verge are normal on                            retroflexion view. Last Endoscopy: 03/2022 - Z-line irregular, 41 cm from the incisors.                           - 1 cm hiatal hernia.                           - Normal stomach.                           - Normal examined duodenum.                           -  No specimens collected.   Recommendations:  Repeat TCS 10 years   Past Medical History:  Diagnosis Date   Arthritis    CKD (chronic kidney disease)    Coronary artery disease    NSTEMI 05/2012 s/p PTCA/DES of the mid RCA, ramus intermediate, and mid left circumflex, LVEF 55-60%   Essential hypertension, benign    Gout 06/11/2012   Hypercholesteremia    Hyperglycemia    NSTEMI (non-ST elevated myocardial infarction) [facility])    February 2014    Past Surgical History:  Procedure Laterality Date   BACK SURGERY     COLONOSCOPY WITH PROPOFOL  N/A 03/23/2022   Procedure: COLONOSCOPY WITH PROPOFOL ;  Surgeon: Eartha Angelia Sieving, MD;  [facility];  Service: Gastroenterology;  Laterality: N/A;  730 ASA 2   ESOPHAGOGASTRODUODENOSCOPY (EGD) WITH PROPOFOL  N/A 03/23/2022   Procedure: ESOPHAGOGASTRODUODENOSCOPY (EGD) WITH PROPOFOL ;  Surgeon: Eartha Angelia Sieving, MD;  [facility];  Service: Gastroenterology;  Laterality: N/A;   HERNIA REPAIR     LEFT HEART CATHETERIZATION WITH CORONARY ANGIOGRAM N/A 06/04/2012   Procedure: LEFT HEART CATHETERIZATION WITH CORONARY ANGIOGRAM;  Surgeon: Peter M Jordan, MD;  Location[facility] CATH LAB;  Service: Cardiovascular;  Laterality: N/A;   NECK SURGERY     PERCUTANEOUS CORONARY STENT INTERVENTION (PCI-S)  06/04/2012    Procedure: PERCUTANEOUS CORONARY STENT INTERVENTION (PCI-S);  Surgeon: Peter M Jordan, MD;  Location: [facility] CATH LAB;  Service: Cardiovascular;;   POLYPECTOMY  03/23/2022   Procedure: POLYPECTOMY;  Surgeon: Eartha Angelia, Sieving, MD;  [facility];  Service: Gastroenterology;;    Current Outpatient Medications  Medication Sig Dispense Refill   acetaminophen  (TYLENOL ) 500 MG tablet Take 500-1,000 mg by mouth every 6 (six) hours as needed (pain.).     allopurinol (ZYLOPRIM) 100 MG tablet Take 100 mg by mouth at bedtime. 100 mg + 300 mg=400 mg at night     amLODipine  (NORVASC ) 10 MG tablet Take 1 tablet (10 mg total) by mouth daily. 90 tablet 3   Ascorbic Acid (VITAMIN C) 500 MG CHEW Chew 500 mg by mouth at bedtime.     aspirin  EC 81 MG EC tablet Take 1 tablet (81 mg total) by mouth daily.     cholecalciferol (VITAMIN D3) 25 MCG (1000 UNIT) tablet Take 1,000 Units by mouth daily.     clopidogrel  (PLAVIX ) 75 MG tablet Take 1 tablet (75 mg total) by mouth daily. 90 tablet 3   colestipol  (COLESTID ) 1 g tablet Take 2 tablets by mouth twice daily 120 tablet 0   Cyanocobalamin (B-12 PO) Take by mouth daily.     empagliflozin  (JARDIANCE ) 10 MG TABS tablet Take 1 tablet (10 mg total) by mouth daily before breakfast. 90 tablet 3   escitalopram (LEXAPRO) 10 MG tablet Take 10 mg by mouth in the morning.     metoprolol  succinate (TOPROL -XL) 50 MG 24 hr tablet Take 1 tablet (50 mg total) by mouth daily. Take with or immediately following a meal. 90 tablet 3   Multiple Vitamin (MULTIVITAMIN) tablet Take 1 tablet by mouth at bedtime.     nitroGLYCERIN  (NITROSTAT ) 0.4 MG SL tablet Place 1 tablet (0.4 mg total) under the tongue every 5 (five) minutes as needed for chest pain (up to 3 doses). 25 tablet 4   olmesartan (BENICAR) 40 MG tablet Take 40 mg by mouth daily.  3   pantoprazole  (PROTONIX ) 40 MG tablet Take 1 tablet (40 mg total) by mouth daily. 90 tablet 3   rosuvastatin  (  CRESTOR ) 10 MG tablet  TAKE 1 TABLET BY MOUTH EVERY OTHER DAY 45 tablet 3   No current facility-administered medications for this visit.    Allergies as of 02/10/2024 - Review Complete 02/10/2024  Allergen Reaction Noted   Lipitor  [atorvastatin ] Other (See Comments) 07/27/2015   Demerol [meperidine hcl] Nausea And Vomiting 12/26/2020    Social History   Socioeconomic History   Marital status: Married    Spouse name: Not on file   Number of children: Not on file   Years of education: Not on file   Highest education level: Not on file  Occupational History   Not on file  Tobacco Use   Smoking status: Former    Current packs/day: 0.00    Types: Cigarettes    Start date: 04/16/1980    Quit date: 04/16/2001    Years since quitting: 22.8   Smokeless tobacco: Never  Vaping Use   Vaping status: Never Used  Substance and Sexual Activity   Alcohol use: No    Alcohol/week: 0.0 standard drinks of alcohol   Drug use: No   Sexual activity: Yes    Birth control/protection: None  Other Topics Concern   Not on file  Social History Narrative   Not on file   Social Drivers of Health   Financial Resource Strain: Not on file  Food Insecurity: Not on file  Transportation Needs: Not on file  Physical Activity: Not on file  Stress: Not on file  Social Connections: Not on file    Review of systems General: negative for malaise, night sweats, fever, chills +weight loss  Neck: Negative for lumps, goiter, pain and significant neck swelling Resp: Negative for cough, wheezing, dyspnea at rest CV: Negative for chest pain, leg swelling, palpitations, orthopnea GI: denies melena, hematochezia, nausea, vomiting, constipation, dysphagia, odyonophagia, early satiety +weight loss +bloating/gas +looser stools +abdominal pain  MSK: Negative for joint pain or swelling, back pain, and muscle pain. Derm: Negative for itching or rash Psych: Denies depression, anxiety, memory loss, confusion. No homicidal or suicidal ideation.   Heme: Negative for prolonged bleeding, bruising easily, and swollen nodes. Endocrine: Negative for cold or heat intolerance, polyuria, polydipsia and goiter. Neuro: negative for tremor, gait imbalance, syncope and seizures. The remainder of the review of systems is noncontributory.  Physical Exam: Ht 6' (1.829 m)   Wt 235 lb 4.8 oz (106.7 kg)   BMI 31.91 kg/m  General:   Alert and oriented. No distress noted. Pleasant and cooperative.  Head:  Normocephalic and atraumatic. Eyes:  Conjuctiva clear without scleral icterus. Mouth:  Oral mucosa pink and moist. Good dentition. No lesions. Heart: Normal rate and rhythm, s1 and s2 heart sounds present.  Lungs: Clear lung sounds in all lobes. Respirations equal and unlabored. Abdomen:  +BS, soft, non-tender and non-distended. No rebound or guarding. No HSM or masses noted. Derm: No palmar erythema or jaundice Msk:  Symmetrical without gross deformities. Normal posture. Extremities:  Without edema. Neurologic:  Alert and  oriented x4 Psych:  Alert and cooperative. Normal mood and affect.  Invalid input(s): 6 MONTHS   ASSESSMENT: Seth Graves is a 63 y.o. male presenting today for follow up of looser stools, weight loss, abdominal pain, bloating/gas  Onset of looser/fatty stools in June. Testing as above remarkable for elevate fat content in the stool, pancreatic elastase was >800. GB is in situ. He notes floating/fatty stools. He has had some weight loss as well, notably down from 251lbs to 237lbs since June. Reassuringly  he had a colonoscopy in December 2023, would have a very low suspicion for presence of colonic malignancy causing his symptoms, cannot rule out microscopic colitis, though clinical presentation is not exactly fitting for this. We could still be dealing with some aspect of EPI despite normal elastase as this is not always abnormal in mild cases of EPI. He has had some improvement in stooling with colestipol  2g BID with much  less frequent stools though still some intermittent looser stools. He continues to note ongoing abdominal discomfort, worse after heavy meals, bloating/gas and weight continues to decline which is  most concerning, he is down to 235 lbs from 271 earlier this year. Will increase colestipol  to 3g BID to see if this provides more control over his stooling, however, I am inclined to evaluate his symptoms further with CT Angio to ensure no mesenteric ischemia present given worsening pain after larger meals and weight loss. May consider SIBO/SIMO testing if gas/bloating persists.    PLAN:  -increase colestipol  to 3g BID, take 4 hours apart from other meds -can try IBgard for gas -CT Angio, A/P w wo contrast  -consider SIBO/SIMO testing if gas/bloating persists   All questions were answered, patient verbalized understanding and is in agreement with plan as outlined above.    Follow Up: 3 months   Mandy Fitzwater L. Mariette, MSN, APRN, AGNP-C Adult-Gerontology Nurse Practitioner [facility]  I have reviewed the note and agree with the APP's assessment as described in this progress note  Toribio Fortune, MD Gastroenterology and Hepatology [facility] Gastroenterology

## 2024-02-12 ENCOUNTER — Telehealth: Payer: Self-pay | Admitting: *Deleted

## 2024-02-12 NOTE — Addendum Note (Signed)
 Addended by: DALLIE LIONEL RAMAN on: 02/12/2024 03:44 PM   Modules accepted: Orders

## 2024-02-12 NOTE — Telephone Encounter (Signed)
 PA approved via [employer] but insurance requires him to go to [employer]. They will call patient to schedule. Called pt and made aware  Authorization [id number] DOS: 02/12/2024-08/10/2024

## 2024-02-12 NOTE — Addendum Note (Signed)
 Addended by: DALLIE LIONEL RAMAN on: 02/12/2024 03:33 PM   Modules accepted: Orders

## 2024-02-12 NOTE — Addendum Note (Signed)
 Addended by: JEANELL GRAEME RAMAN on: 02/12/2024 10:53 AM   Modules accepted: Orders

## 2024-02-12 NOTE — Addendum Note (Signed)
 Addended by: DALLIE LIONEL RAMAN on: 02/12/2024 03:55 PM   Modules accepted: Orders

## 2024-02-19 ENCOUNTER — Ambulatory Visit
Admission: RE | Admit: 2024-02-19 | Discharge: 2024-02-19 | Disposition: A | Discharge status: Home / Self Care | Source: Ambulatory Visit | Attending: Gastroenterology | Admitting: Gastroenterology

## 2024-02-19 ENCOUNTER — Encounter: Payer: Self-pay | Admitting: Radiology

## 2024-02-19 DIAGNOSIS — R197 Diarrhea, unspecified: Secondary | ICD-10-CM

## 2024-02-19 DIAGNOSIS — R1030 Lower abdominal pain, unspecified: Secondary | ICD-10-CM

## 2024-02-19 DIAGNOSIS — R634 Abnormal weight loss: Secondary | ICD-10-CM

## 2024-02-19 MED ORDER — IOPAMIDOL (ISOVUE-370) INJECTION 76%
80.0000 mL | Freq: Once | INTRAVENOUS | Status: AC | PRN
  Administered 2024-02-19: 80 mL via INTRAVENOUS

## 2024-02-27 ENCOUNTER — Other Ambulatory Visit: Payer: Self-pay | Admitting: Family Medicine

## 2024-02-27 ENCOUNTER — Ambulatory Visit (INDEPENDENT_AMBULATORY_CARE_PROVIDER_SITE_OTHER): Payer: Self-pay | Admitting: Gastroenterology

## 2024-03-30 ENCOUNTER — Ambulatory Visit: Admitting: Family Medicine

## 2024-03-30 VITALS — BP 124/83 | HR 53 | Temp 98.4°F | Ht 72.0 in | Wt 236.0 lb

## 2024-03-30 DIAGNOSIS — I10 Essential (primary) hypertension: Secondary | ICD-10-CM | POA: Diagnosis not present

## 2024-03-30 DIAGNOSIS — E1165 Type 2 diabetes mellitus with hyperglycemia: Secondary | ICD-10-CM

## 2024-03-30 DIAGNOSIS — N2889 Other specified disorders of kidney and ureter: Secondary | ICD-10-CM | POA: Insufficient documentation

## 2024-03-30 DIAGNOSIS — N1831 Chronic kidney disease, stage 3a: Secondary | ICD-10-CM

## 2024-03-30 DIAGNOSIS — Z7984 Long term (current) use of oral hypoglycemic drugs: Secondary | ICD-10-CM

## 2024-03-30 NOTE — Assessment & Plan Note (Signed)
 Stable.  Follows with nephrology.  Continue Jardiance .

## 2024-03-30 NOTE — Patient Instructions (Signed)
 MRI ordered.  Labs today.  Follow up in 3-6 months.

## 2024-03-30 NOTE — Assessment & Plan Note (Signed)
 Stable.  Continue current medications.

## 2024-03-30 NOTE — Assessment & Plan Note (Signed)
 Indeterminate.  Etiology and prognosis unclear at this time.  Proceeding with MRI for further evaluation.

## 2024-03-30 NOTE — Progress Notes (Signed)
 Subjective:  Patient ID: Seth Graves, male    DOB: 1960/08/08  Age: 63 y.o. MRN: [id number]  CC: Follow-up   HPI:  63 year old male presents for follow-up.  Patient following closely with GI.  He has been having GI symptoms including steatorrhea.  Had a recent CT scan which was negative for mesenteric ischemia.  It did reveal a 1.4 cm right renal heterogeneous lesion.  MRI has been recommended.  Will discuss today.  Patient needs A1c.  He is tolerating Jardiance .  Patient has underlying CKD as well.  Follows with nephrology.  Last urine ACR was significantly elevated.  Follows closely with cardiology.  CAD currently stable.  Hypertension stable.  Patient Active Problem List   Diagnosis Date Noted   Renal mass, right 03/30/2024   Loss of weight 02/10/2024   Lower abdominal pain 02/10/2024   Diarrhea 12/09/2023   CKD (chronic kidney disease) stage 3, GFR 30-59 ml/min (HCC) 11/03/2023   Type 2 diabetes mellitus with hyperglycemia, without long-term current use of insulin (HCC) 10/31/2023   Chronic pain of right knee 10/31/2023   Elevated LFTs 10/16/2023   GERD (gastroesophageal reflux disease) 01/22/2022   Hyperlipidemia 06/11/2012   Essential hypertension, benign 06/11/2012   Gout 06/11/2012   Coronary atherosclerosis of native coronary artery 06/03/2012    Social Hx   Social History   Socioeconomic History   Marital status: Married    Spouse name: Not on file   Number of children: Not on file   Years of education: Not on file   Highest education level: Some college, no degree  Occupational History   Not on file  Tobacco Use   Smoking status: Former    Current packs/day: 0.00    Types: Cigarettes    Start date: 04/16/1980    Quit date: 04/16/2001    Years since quitting: 22.9   Smokeless tobacco: Never  Vaping Use   Vaping status: Never Used  Substance and Sexual Activity   Alcohol use: No    Alcohol/week: 0.0 standard drinks of alcohol   Drug use: No   Sexual  activity: Yes    Birth control/protection: None  Other Topics Concern   Not on file  Social History Narrative   Not on file   Social Drivers of Health   Tobacco Use: Medium Risk (03/30/2024)   Patient History    Smoking Tobacco Use: Former    Smokeless Tobacco Use: Never    Passive Exposure: Not on Actuary Strain: Low Risk (03/30/2024)   Overall Financial Resource Strain (CARDIA)    Difficulty of Paying Living Expenses: Not very hard  Food Insecurity: No Food Insecurity (03/30/2024)   Epic    Worried About Programme Researcher, Broadcasting/film/video in the Last Year: Never true    Ran Out of Food in the Last Year: Never true  Transportation Needs: No Transportation Needs (03/30/2024)   Epic    Lack of Transportation (Medical): No    Lack of Transportation (Non-Medical): No  Physical Activity: Insufficiently Active (03/30/2024)   Exercise Vital Sign    Days of Exercise per Week: 1 day    Minutes of Exercise per Session: 20 min  Stress: No Stress Concern Present (03/30/2024)   [employer] of Occupational Health - Occupational Stress Questionnaire    Feeling of Stress: Not at all  Social Connections: Moderately Isolated (03/30/2024)   Social Connection and Isolation Panel    Frequency of Communication with Friends and Family: Three times a week  Frequency of Social Gatherings with Friends and Family: Patient declined    Attends Religious Services: Patient declined    Active Member of Clubs or Organizations: No    Attends Engineer, Structural: Not on file    Marital Status: Married  Depression (PHQ2-9): Low Risk (10/30/2023)   Depression (PHQ2-9)    PHQ-2 Score: 2  Alcohol Screen: Low Risk (03/30/2024)   Alcohol Screen    Last Alcohol Screening Score (AUDIT): 1  Housing: Low Risk (03/30/2024)   Epic    Unable to Pay for Housing in the Last Year: No    Number of Times Moved in the Last Year: 0    Homeless in the Last Year: No  Utilities: Not on file  Health  Literacy: Not on file    Review of Systems Per HPI  Objective:  BP 124/83   Pulse (!) 53   Temp 98.4 F (36.9 C)   Ht 6' (1.829 m)   Wt 236 lb (107 kg)   SpO2 98%   BMI 32.01 kg/m      03/30/2024    9:19 AM 02/10/2024    2:27 PM 02/10/2024    2:20 PM  BP/Weight  Systolic BP 124 147 151  Diastolic BP 83 74 86  Wt. (Lbs) 236  235.3  BMI 32.01 kg/m2  31.91 kg/m2    Physical Exam Vitals and nursing note reviewed.  Constitutional:      General: He is not in acute distress.    Appearance: Normal appearance.  HENT:     Head: Normocephalic and atraumatic.  Eyes:     General:        Right eye: No discharge.        Left eye: No discharge.     Conjunctiva/sclera: Conjunctivae normal.  Cardiovascular:     Rate and Rhythm: Normal rate and regular rhythm.  Pulmonary:     Effort: Pulmonary effort is normal.     Breath sounds: Normal breath sounds. No wheezing, rhonchi or rales.  Neurological:     Mental Status: He is alert.  Psychiatric:        Mood and Affect: Mood normal.        Behavior: Behavior normal.     Lab Results  Component Value Date   WBC 7.4 10/31/2023   HGB 14.1 10/31/2023   HCT 42.2 10/31/2023   PLT 259 10/31/2023   GLUCOSE 125 (H) 10/31/2023   CHOL 157 10/31/2023   TRIG 141 10/31/2023   HDL 34 (L) 10/31/2023   LDLDIRECT 92 02/21/2022   LDLCALC 98 10/31/2023   ALT 20 10/31/2023   AST 20 10/31/2023   NA 140 10/31/2023   K 4.3 10/31/2023   CL 102 10/31/2023   CREATININE 1.42 (H) 10/31/2023   BUN 18 10/31/2023   CO2 23 10/31/2023   TSH 1.13 10/14/2023   INR 1.01 06/03/2012   HGBA1C 7.0 (H) 05/15/2022     Assessment & Plan:  Renal mass, right Assessment & Plan: Indeterminate.  Etiology and prognosis unclear at this time.  Proceeding with MRI for further evaluation.  Orders: -     MR ABDOMEN W WO CONTRAST  Type 2 diabetes mellitus with hyperglycemia, without long-term current use of insulin (HCC) Assessment & Plan: A1c today to  reassess.  Continue Jardiance .  Orders: -     Microalbumin / creatinine urine ratio -     Hemoglobin A1c  Other specified disorders of kidney and ureter -     MR  ABDOMEN W WO CONTRAST  Essential hypertension, benign Assessment & Plan: Stable.  Continue current medications.   Stage 3a chronic kidney disease (HCC) Assessment & Plan: Stable.  Follows with nephrology.  Continue Jardiance .     Follow-up: 3 to 6 months  Emerita Berkemeier Bluford DO [facility]

## 2024-03-30 NOTE — Assessment & Plan Note (Signed)
 A1c today to reassess.  Continue Jardiance .

## 2024-03-31 ENCOUNTER — Ambulatory Visit: Payer: Self-pay | Admitting: Family Medicine

## 2024-03-31 ENCOUNTER — Other Ambulatory Visit: Payer: Self-pay | Admitting: Family Medicine

## 2024-03-31 DIAGNOSIS — N2889 Other specified disorders of kidney and ureter: Secondary | ICD-10-CM

## 2024-03-31 LAB — MICROALBUMIN / CREATININE URINE RATIO
Creatinine, Urine: 67.1 mg/dL
Microalb/Creat Ratio: 186 mg/g{creat} — ABNORMAL HIGH (ref 0–29)
Microalbumin, Urine: 124.6 ug/mL

## 2024-03-31 LAB — HEMOGLOBIN A1C
Est. average glucose Bld gHb Est-mCnc: 134 mg/dL
Hgb A1c MFr Bld: 6.3 % — ABNORMAL HIGH (ref 4.8–5.6)

## 2024-04-27 ENCOUNTER — Ambulatory Visit: Payer: Self-pay | Admitting: Family Medicine

## 2024-04-27 ENCOUNTER — Ambulatory Visit
Admission: RE | Admit: 2024-04-27 | Discharge: 2024-04-27 | Disposition: A | Source: Ambulatory Visit | Attending: Family Medicine | Admitting: Family Medicine

## 2024-04-27 DIAGNOSIS — N2889 Other specified disorders of kidney and ureter: Secondary | ICD-10-CM

## 2024-04-27 MED ORDER — GADOPICLENOL 0.5 MMOL/ML IV SOLN
10.0000 mL | Freq: Once | INTRAVENOUS | Status: AC | PRN
  Administered 2024-04-27: 10 mL via INTRAVENOUS

## 2024-04-28 ENCOUNTER — Other Ambulatory Visit (INDEPENDENT_AMBULATORY_CARE_PROVIDER_SITE_OTHER): Payer: Self-pay | Admitting: Gastroenterology

## 2024-04-28 MED ORDER — COLESTIPOL HCL 1 G PO TABS: 3.0000 g | ORAL_TABLET | Freq: Two times a day (BID) | ORAL | 1 refills | Status: AC

## 2024-04-28 NOTE — Telephone Encounter (Signed)
 Per Note from 02/10/2024 with Mitzie Boettcher NP, increase colestipol  to 3g BID, take 4 hours apart from other meds

## 2024-05-12 ENCOUNTER — Encounter (INDEPENDENT_AMBULATORY_CARE_PROVIDER_SITE_OTHER): Payer: Self-pay | Admitting: Gastroenterology

## 2024-05-12 ENCOUNTER — Ambulatory Visit (INDEPENDENT_AMBULATORY_CARE_PROVIDER_SITE_OTHER): Admitting: Gastroenterology

## 2024-05-12 VITALS — BP 156/96 | HR 60 | Temp 97.8°F | Ht 72.0 in | Wt 239.2 lb

## 2024-05-12 DIAGNOSIS — R140 Abdominal distension (gaseous): Secondary | ICD-10-CM

## 2024-05-12 DIAGNOSIS — R197 Diarrhea, unspecified: Secondary | ICD-10-CM

## 2024-05-12 NOTE — Patient Instructions (Signed)
-   repeat MRI  in jan 2027 -parasite testing  x3 -alpha gal testing  -will trial pancreatic enzyme samples if alpha gal/parasite testing is negative  -continue with colestipol    Follow up 3 months  It was a pleasure to see you today. I want to create trusting relationships with patients and provide genuine, compassionate, and quality care. I truly value your feedback! please be on the lookout for a survey regarding your visit with me today. I appreciate your input about our visit and your time in completing this!    Irisa Grimsley L. Dajohn Ellender, MSN, APRN, AGNP-C Adult-Gerontology Nurse Practitioner [facility] at [facility]

## 2024-05-12 NOTE — Progress Notes (Signed)
 "  Referring Provider: Cook, Jayce G, DO Primary Care Physician:  Cook, Jayce G, DO Primary GI Physician: Previously Dr. Eartha (Dr. Cinderella)   Chief Complaint  Patient presents with   Follow-up    Patient here today for a follow up, and he has been having issues with diarrhea. He is taking Colestipol  1 g three tablets bid, but had ran out of this and was off of it for awhile, he has resumed this.    HPI:   Seth Graves is a 64 y.o. male with past medical history of CAD and NSTEMI s/p stents on DAPT, HTN, gout, CKD, DM   Patient presenting today for:  Follow up of diarrhea and bloating  Last seen October, at that time doing some better on colestipol  2g BID. Intermittent solid/looser stools. Some bloating and gas. Lower abdominal discomfort with some meals. 36 pounds weight loss since march. Diarrhea worse with greasy/fatty foods   Labs on 10/15/23: Celiac panel negative C diff negative GI pathogen panel negative Fecal fat abnormal  Pancreatic elastase >800 TSH 1.13  Recommended to increase colestipol  to 3g BID, IBgard for gas, CT Angio A/P, consider SIBO/SIMO testing if gas/bloating persists  CT ANgio A/P w wo contrast: 02/2024 No evidence of mesenteric ischemia. Normal SMA without a visible branch occlusion. 2. Heterogeneous 1.4 cm right renal heterogeneous lesion with indeterminate enhancement; solid mass not excluded. Recommend renal protocol MRI. 3. Moderate patchy calcific aortic atherosclerosis without aneurysm, stenosis, dissection, or penetrating ulcer. 4. Mild hepatic steatosis. 5. Prostatomegaly.  Scan forwarded to PCP for further evaluation of renal lesion  MR abdomen w wo contrast: 04/27/24:  1.2 x 1.5 cm proteinaceous/hemorrhagic cyst arising from the right kidney interpolar region, anteriorly which corresponds to the observation described on recent CT scan abdomen and pelvis. There is no suspicious renal lesion on either side. 2. There is a multiloculated  subcapsular partially exophytic 1.3 x 1.8 cm lesion in the right hepatic dome, segment 7. The lesion exhibits few thin probable enhancing intervening septations. No mural nodularity. However, on postcontrast images there is peripheral enhancing wall. Findings are nonspecific and differential diagnosis includes complex cystic lesions such as biliary cystadenoma. 3. There are multiple, homogeneous, T2 hyperintense lesions throughout the pancreas with largest in the uncinate process, anteriorly measuring up to 8 x 10 mm. 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 There is no mural nodularity or abnormal enhancement. Follow-up examination is recommended in 1 year with contrast-enhanced MRI abdomen.  Present:  Feels he has had some improvement in amount of stooling on colestipol  but still with looser, greasier stools a bit more formed on colestipol . Stools are sometimes watery. He still feels bloated and has some gas pains. He notes sensation of something moving in his stomach at times. No real abdominal pain. He denies much urgency with BMs. Feels BMs can be anytime, not really related to meal times or any trigger foods. Drinks a lot of water and coffee. Sometimes beef causes him more issues. He notes some tick bites last year.    Last Colonoscopy:03/2022  - Two 3 to 5 mm polyps in the transverse colon-hyperplastic polyps                            - Diverticulosis in the sigmoid colon.                           -  The distal rectum and anal verge are normal on                            retroflexion view. COLON, TRANSVERSE, POLYPECTOMY:  - Polypoid fragment of benign colonic mucosa with mild hyperplastic  change  Last Endoscopy: 03/2022 - Z-line irregular, 41 cm from the incisors.                           - 1 cm hiatal hernia.                           - Normal stomach.                            - Normal examined duodenum.                           - No specimens collected.   Recommendations:  Repeat TCS 10 years  Filed Weights   05/12/24 1501  Weight: 239 lb 3.2 oz (108.5 kg)     Past Medical History:  Diagnosis Date   Arthritis    CKD (chronic kidney disease)    Coronary artery disease    NSTEMI 05/2012 s/p PTCA/DES of the mid RCA, ramus intermediate, and mid left circumflex, LVEF 55-60%   Essential hypertension, benign    Gout 06/11/2012   Hypercholesteremia    Hyperglycemia    NSTEMI (non-ST elevated myocardial infarction) [facility])    February 2014    Past Surgical History:  Procedure Laterality Date   BACK SURGERY     COLONOSCOPY WITH PROPOFOL  N/A 03/23/2022   Procedure: COLONOSCOPY WITH PROPOFOL ;  Surgeon: Eartha Angelia Sieving, MD;  [facility];  Service: Gastroenterology;  Laterality: N/A;  730 ASA 2   ESOPHAGOGASTRODUODENOSCOPY (EGD) WITH PROPOFOL  N/A 03/23/2022   Procedure: ESOPHAGOGASTRODUODENOSCOPY (EGD) WITH PROPOFOL ;  Surgeon: Eartha Angelia Sieving, MD;  Location: [facility];  Service: Gastroenterology;  Laterality: N/A;   HERNIA REPAIR     LEFT HEART CATHETERIZATION WITH CORONARY ANGIOGRAM N/A 06/04/2012   Procedure: LEFT HEART CATHETERIZATION WITH CORONARY ANGIOGRAM;  Surgeon: Peter M Jordan, MD;  Location: [facility] CATH LAB;  Service: Cardiovascular;  Laterality: N/A;   NECK SURGERY     PERCUTANEOUS CORONARY STENT INTERVENTION (PCI-S)  06/04/2012   Procedure: PERCUTANEOUS CORONARY STENT INTERVENTION (PCI-S);  Surgeon: Peter M Jordan, MD;  Location: [facility] CATH LAB;  Service: Cardiovascular;;   POLYPECTOMY  03/23/2022   Procedure: POLYPECTOMY;  Surgeon: Eartha Angelia, Sieving, MD;  Location: [facility];  Service: Gastroenterology;;    Current Outpatient Medications  Medication Sig Dispense Refill   acetaminophen  (TYLENOL ) 500 MG tablet Take 500-1,000 mg by mouth every 6 (six) hours as needed (pain.).     allopurinol (ZYLOPRIM) 100 MG  tablet Take 100 mg by mouth at bedtime. 100 mg + 300 mg=400 mg at night     amLODipine  (NORVASC ) 10 MG tablet Take 1 tablet (10 mg total) by mouth daily. 90 tablet 3   aspirin  EC 81 MG EC tablet Take 1 tablet (81 mg total) by mouth daily.     clopidogrel  (PLAVIX ) 75 MG tablet Take 1 tablet (75 mg total) by mouth daily. 90 tablet 3   colestipol  (COLESTID ) 1 g tablet Take 3 tablets (3 g total) by mouth 2 (  two) times daily. 540 tablet 1   empagliflozin  (JARDIANCE ) 10 MG TABS tablet Take 1 tablet (10 mg total) by mouth daily before breakfast. 90 tablet 3   metoprolol  succinate (TOPROL -XL) 50 MG 24 hr tablet Take 1 tablet (50 mg total) by mouth daily. Take with or immediately following a meal. 90 tablet 3   Multiple Vitamin (MULTIVITAMIN) tablet Take 1 tablet by mouth at bedtime.     nitroGLYCERIN  (NITROSTAT ) 0.4 MG SL tablet Place 1 tablet (0.4 mg total) under the tongue every 5 (five) minutes as needed for chest pain (up to 3 doses). 25 tablet 4   olmesartan (BENICAR) 40 MG tablet Take 40 mg by mouth daily.  3   pantoprazole  (PROTONIX ) 40 MG tablet Take 1 tablet (40 mg total) by mouth daily. 90 tablet 3   rosuvastatin  (CRESTOR ) 10 MG tablet TAKE 1 TABLET BY MOUTH EVERY OTHER DAY 45 tablet 3   Ascorbic Acid (VITAMIN C) 500 MG CHEW Chew 500 mg by mouth at bedtime. (Patient not taking: Reported on 05/12/2024)     cholecalciferol (VITAMIN D3) 25 MCG (1000 UNIT) tablet Take 1,000 Units by mouth daily. (Patient not taking: Reported on 05/12/2024)     No current facility-administered medications for this visit.    Allergies as of 05/12/2024 - Review Complete 05/12/2024  Allergen Reaction Noted   Lipitor  [atorvastatin ] Other (See Comments) 07/27/2015   Demerol [meperidine hcl] Nausea And Vomiting 12/26/2020    Social History   Socioeconomic History   Marital status: Married    Spouse name: Not on file   Number of children: Not on file   Years of education: Not on file   Highest education level:  Some college, no degree  Occupational History   Not on file  Tobacco Use   Smoking status: Former    Current packs/day: 0.00    Types: Cigarettes    Start date: 04/16/1980    Quit date: 04/16/2001    Years since quitting: 23.0   Smokeless tobacco: Never  Vaping Use   Vaping status: Never Used  Substance and Sexual Activity   Alcohol use: No    Alcohol/week: 0.0 standard drinks of alcohol   Drug use: No   Sexual activity: Yes    Birth control/protection: None  Other Topics Concern   Not on file  Social History Narrative   Not on file   Social Drivers of Health   Tobacco Use: Medium Risk (05/12/2024)   Patient History    Smoking Tobacco Use: Former    Smokeless Tobacco Use: Never    Passive Exposure: Not on Actuary Strain: Low Risk (03/30/2024)   Overall Financial Resource Strain (CARDIA)    Difficulty of Paying Living Expenses: Not very hard  Food Insecurity: No Food Insecurity (03/30/2024)   Epic    Worried About Radiation Protection Practitioner of Food in the Last Year: Never true    Ran Out of Food in the Last Year: Never true  Transportation Needs: No Transportation Needs (03/30/2024)   Epic    Lack of Transportation (Medical): No    Lack of Transportation (Non-Medical): No  Physical Activity: Insufficiently Active (03/30/2024)   Exercise Vital Sign    Days of Exercise per Week: 1 day    Minutes of Exercise per Session: 20 min  Stress: No Stress Concern Present (03/30/2024)   [employer] of Occupational Health - Occupational Stress Questionnaire    Feeling of Stress: Not at all  Social Connections: Moderately Isolated (03/30/2024)  Social Connection and Isolation Panel    Frequency of Communication with Friends and Family: Three times a week    Frequency of Social Gatherings with Friends and Family: Patient declined    Attends Religious Services: Patient declined    Active Member of Clubs or Organizations: No    Attends Engineer, Structural: Not on  file    Marital Status: Married  Depression (PHQ2-9): Low Risk (10/30/2023)   Depression (PHQ2-9)    PHQ-2 Score: 2  Alcohol Screen: Low Risk (03/30/2024)   Alcohol Screen    Last Alcohol Screening Score (AUDIT): 1  Housing: Low Risk (03/30/2024)   Epic    Unable to Pay for Housing in the Last Year: No    Number of Times Moved in the Last Year: 0    Homeless in the Last Year: No  Utilities: Not on file  Health Literacy: Not on file    Review of systems General: negative for malaise, night sweats, fever, chills, weight loss Neck: Negative for lumps, goiter, pain and significant neck swelling Resp: Negative for cough, wheezing, dyspnea at rest CV: Negative for chest pain, leg swelling, palpitations, orthopnea GI: denies melena, hematochezia, nausea, vomiting, constipation, dysphagia, odyonophagia, early satiety or unintentional weight loss. +bloating +diarrhea  MSK: Negative for joint pain or swelling, back pain, and muscle pain. Derm: Negative for itching or rash Psych: Denies depression, anxiety, memory loss, confusion. No homicidal or suicidal ideation.  Heme: Negative for prolonged bleeding, bruising easily, and swollen nodes. Endocrine: Negative for cold or heat intolerance, polyuria, polydipsia and goiter. Neuro: negative for tremor, gait imbalance, syncope and seizures. The remainder of the review of systems is noncontributory.  Physical Exam: BP (!) 156/96 (BP Location: Left Arm, Patient Position: Sitting, Cuff Size: Large)   Pulse 60   Temp 97.8 F (36.6 C) (Temporal)   Ht 6' (1.829 m)   Wt 239 lb 3.2 oz (108.5 kg)   BMI 32.44 kg/m  General:   Alert and oriented. No distress noted. Pleasant and cooperative.  Head:  Normocephalic and atraumatic. Eyes:  Conjuctiva clear without scleral icterus. Mouth:  Oral mucosa pink and moist. Good dentition. No lesions. Heart: Normal rate and rhythm, s1 and s2 heart sounds present.  Lungs: Clear lung sounds in all lobes.  Respirations equal and unlabored. Abdomen:  +BS, soft, non-tender and non-distended. No rebound or guarding. No HSM or masses noted. Derm: No palmar erythema or jaundice Msk:  Symmetrical without gross deformities. Normal posture. Extremities:  Without edema. Neurologic:  Alert and  oriented x4 Psych:  Alert and cooperative. Normal mood and affect.  Invalid input(s): 6 MONTHS   ASSESSMENT: Geraldine Tesar is a 64 y.o. male presenting today for follow up of diarrhea and bloating  Onset of looser/fatty stools in June 2025. Testing as above remarkable for elevate fat content in the stool, pancreatic elastase was >800. GB is in situ. He notes floating/fatty stools. He has had some weight loss as well, notably down from 251lbs to 237lbs since June. Reassuringly he had a colonoscopy in December 2023, would have a very low suspicion for presence of colonic malignancy causing his symptoms, cannot rule out microscopic colitis, though clinical presentation is not exactly fitting for this. We could still be dealing with some aspect of EPI despite normal elastase as this is not always abnormal in mild cases of EPI. He has had some improvement in stooling with colestipol  3g BID with much less frequent stools though still looser to watery stools. He  continues to note ongoing abdominal discomfort, worse after heavy meals, bloating/gas. CT angio as above without chronic mesenteric ischemia. Weight is stable over the past few months which is reassuring. Patient is concerned about parasites which I think is a low probability, however, he has not yet been tested for this, also has not had previous alpha gal testing, we will perform these. If testing is negative, will trial samples of creon as he has risks factors for EPI. 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   Follow Up: 4 months   Yilin Weedon L. Khalani Novoa, MSN, APRN, AGNP-C Adult-Gerontology Nurse Practitioner [facility]  "

## 2024-05-19 ENCOUNTER — Other Ambulatory Visit (INDEPENDENT_AMBULATORY_CARE_PROVIDER_SITE_OTHER): Payer: Self-pay | Admitting: Gastroenterology

## 2024-05-19 ENCOUNTER — Ambulatory Visit: Admitting: Family Medicine

## 2024-05-19 VITALS — BP 148/90 | HR 94 | Temp 98.4°F | Ht 72.0 in | Wt 236.5 lb

## 2024-05-19 DIAGNOSIS — N1831 Chronic kidney disease, stage 3a: Secondary | ICD-10-CM

## 2024-05-19 DIAGNOSIS — I10 Essential (primary) hypertension: Secondary | ICD-10-CM

## 2024-05-19 DIAGNOSIS — K760 Fatty (change of) liver, not elsewhere classified: Secondary | ICD-10-CM | POA: Insufficient documentation

## 2024-05-19 MED ORDER — CARVEDILOL 6.25 MG PO TABS: 6.2500 mg | ORAL_TABLET | Freq: Two times a day (BID) | ORAL | 1 refills | Status: AC

## 2024-05-19 NOTE — Patient Instructions (Signed)
 Lab today.  Changed metoprolol  to carvedilol .  Follow up in 1 month.

## 2024-05-19 NOTE — Assessment & Plan Note (Signed)
 Uncontrolled/exacerbation.  Continue amlodipine  and olmesartan.  After discussion today, changed metoprolol  to carvedilol .  Has cardiology follow-up tomorrow.  Metabolic panel today.

## 2024-05-20 ENCOUNTER — Ambulatory Visit: Admitting: Cardiology

## 2024-05-20 ENCOUNTER — Ambulatory Visit: Payer: Self-pay | Admitting: Family Medicine

## 2024-05-20 ENCOUNTER — Encounter: Payer: Self-pay | Admitting: Cardiology

## 2024-05-20 ENCOUNTER — Ambulatory Visit (INDEPENDENT_AMBULATORY_CARE_PROVIDER_SITE_OTHER): Payer: Self-pay | Admitting: Gastroenterology

## 2024-05-20 VITALS — BP 138/90 | HR 73 | Ht 72.0 in | Wt 240.0 lb

## 2024-05-20 DIAGNOSIS — I25119 Atherosclerotic heart disease of native coronary artery with unspecified angina pectoris: Secondary | ICD-10-CM

## 2024-05-20 DIAGNOSIS — I25118 Atherosclerotic heart disease of native coronary artery with other forms of angina pectoris: Secondary | ICD-10-CM

## 2024-05-20 DIAGNOSIS — I10 Essential (primary) hypertension: Secondary | ICD-10-CM | POA: Diagnosis not present

## 2024-05-20 DIAGNOSIS — E782 Mixed hyperlipidemia: Secondary | ICD-10-CM | POA: Diagnosis not present

## 2024-05-20 DIAGNOSIS — E785 Hyperlipidemia, unspecified: Secondary | ICD-10-CM

## 2024-05-20 LAB — CMP14+EGFR
ALT: 16 [IU]/L (ref 0–44)
AST: 17 [IU]/L (ref 0–40)
Albumin: 4.3 g/dL (ref 3.9–4.9)
Alkaline Phosphatase: 69 [IU]/L (ref 47–123)
BUN/Creatinine Ratio: 18 (ref 10–24)
BUN: 24 mg/dL (ref 8–27)
Bilirubin Total: 0.4 mg/dL (ref 0.0–1.2)
CO2: 23 mmol/L (ref 20–29)
Calcium: 9.7 mg/dL (ref 8.6–10.2)
Chloride: 103 mmol/L (ref 96–106)
Creatinine, Ser: 1.35 mg/dL — ABNORMAL HIGH (ref 0.76–1.27)
Globulin, Total: 2.6 g/dL (ref 1.5–4.5)
Glucose: 89 mg/dL (ref 70–99)
Potassium: 4.5 mmol/L (ref 3.5–5.2)
Sodium: 143 mmol/L (ref 134–144)
Total Protein: 6.9 g/dL (ref 6.0–8.5)
eGFR: 60 mL/min/{1.73_m2}

## 2024-05-20 LAB — ALPHA-GAL PANEL
Allergen Lamb IgE: 2.12 kU/L — AB
Beef IgE: 7.49 kU/L — AB
IgE (Immunoglobulin E), Serum: 96 [IU]/mL (ref 6–495)
O215-IgE Alpha-Gal: 9.14 kU/L — AB
Pork IgE: 3.39 kU/L — AB

## 2024-05-20 MED ORDER — ROSUVASTATIN CALCIUM 10 MG PO TABS: 10.0000 mg | ORAL_TABLET | Freq: Every day | ORAL | 3 refills | Status: AC

## 2024-05-20 NOTE — Progress Notes (Signed)
 "    Cardiology Office Note  Date: 05/20/2024   ID: Seward, Coran Apr 21, 1960, MRN [id number]  History of Present Illness: Seth Graves is a 64 y.o. male last seen in March 2025 by Ms. Strader PA-C, I reviewed her note.  Our last visit was in 2020.  He is here for a follow-up visit.  I reviewed the chart.  He has had elevations in blood pressure at various healthcare encounters, recently saw his PCP Dr. Bluford yesterday and was switched from metoprolol  to Coreg  with otherwise stable antihypertensive regimen.  He does not report any new exertional symptoms, stable NYHA class I-II dyspnea, no palpitations or syncope.  Continues to drive a truck.  He had a low risk reassuring Lexiscan  Myoview  in March of last year.  His ECG is normal today.  We went over his medications.  Discussed attempting Crestor  at 10 mg daily since his LDL was 98 in July 2025.  Did have prior myalgias on Lipitor .  Physical Exam: VS:  BP (!) 138/90 (BP Location: Left Arm, Patient Position: Sitting, Cuff Size: Normal)   Pulse 73   Ht 6' (1.829 m)   Wt 240 lb (108.9 kg)   SpO2 95%   BMI 32.55 kg/m , BMI Body mass index is 32.55 kg/m.  Wt Readings from Last 3 Encounters:  05/20/24 240 lb (108.9 kg)  05/19/24 236 lb 8 oz (107.3 kg)  05/12/24 239 lb 3.2 oz (108.5 kg)    General: Patient appears comfortable at rest. HEENT: Conjunctiva and lids normal. Neck: Supple, no elevated JVP or carotid bruits. Lungs: Clear to auscultation, nonlabored breathing at rest. Cardiac: Regular rate and rhythm, no S3 or significant systolic murmur.  ECG:  An ECG dated 06/21/2023 was personally reviewed today and demonstrated:  Normal sinus rhythm.  Labwork: 06/27/2023: B Natriuretic Peptide 13.0 10/14/2023: TSH 1.13 10/31/2023: Hemoglobin 14.1; Platelets 259 05/19/2024: ALT 16; AST 17; BUN 24; Creatinine, Ser 1.35; Potassium 4.5; Sodium 143     Component Value Date/Time   CHOL 157 10/31/2023 1028   TRIG 141 10/31/2023 1028   HDL 34 (L)  10/31/2023 1028   CHOLHDL 4.6 10/31/2023 1028   CHOLHDL 4.6 06/27/2023 1023   VLDL 38 06/27/2023 1023   LDLCALC 98 10/31/2023 1028   LDLDIRECT 92 02/21/2022 1120   Other Studies Reviewed Today:  Lexiscan  Myoview  06/27/2023:   Stress ECG is negative for ischemia and arrhythmias.   LV perfusion is normal. There is no evidence of ischemia. There is no evidence of infarction.   Left ventricular function is normal. Nuclear stress EF: 59%. The left ventricular ejection fraction is normal (55-65%).   Findings are consistent with no ischemia and no infarction. The study is low risk.  Assessment and Plan:  1.  CAD status post NSTEMI in 2014 with DES to RCA, ramus intermedius, and circumflex.  Lexiscan  Myoview  in March 2025 showed no evidence of ischemia with LVEF 59%, low risk.  He does not describe any angina or interval nitroglycerin  use.  ECG is normal today.  Plan to continue medical therapy which now includes aspirin  81 mg daily, Plavix  75 mg daily, Crestor  to be increased to 10 mg daily, and as needed nitroglycerin .  He is also on Jardiance .  2.  Primary hypertension.  Recently switched from metoprolol  to Coreg  6.25 mg twice daily by Dr. Bluford.  Otherwise continues on Norvasc  10 mg daily and Benicar 40 mg daily.  Keep pending follow-up with Dr. Bluford for review of blood pressure control.  3.  Mixed hyperlipidemia.  LDL 98 in July 2025.  Attempt increasing Crestor  to 10 mg daily.  He has prior history of statin myalgias on Lipitor .  If he does not tolerate Crestor  at any higher than every other day dosing, could consider addition of Zetia or bempedoic acid, otherwise PCSK9 inhibitor.  Disposition:  Follow up 1 year.  Signed, Jayson JUDITHANN Sierras, M.D., F.A.C.C. [facility] "

## 2024-05-20 NOTE — Patient Instructions (Signed)
 Medication Instructions:  Take Crestor  10 mg daily  Labwork: None today  Testing/Procedures: None today  Follow-Up: 1 year Dr.McDowell  Any Other Special Instructions Will Be Listed Below (If Applicable).  If you need a refill on your cardiac medications before your next appointment, please call your pharmacy.

## 2024-05-22 LAB — OVA AND PARASITE EXAMINATION

## 2024-07-27 ENCOUNTER — Ambulatory Visit: Admitting: Family Medicine
